# Patient Record
Sex: Male | Born: 1960
Health system: Southern US, Community
[De-identification: ages and names within clinical notes are randomized; demographics above are authoritative.]

## PROBLEM LIST (undated history)

## (undated) DIAGNOSIS — E785 Hyperlipidemia, unspecified: Secondary | ICD-10-CM

## (undated) DIAGNOSIS — F1110 Opioid abuse, uncomplicated: Secondary | ICD-10-CM

## (undated) DIAGNOSIS — I10 Essential (primary) hypertension: Secondary | ICD-10-CM

## (undated) DIAGNOSIS — E089 Diabetes mellitus due to underlying condition without complications: Secondary | ICD-10-CM

## (undated) DIAGNOSIS — K8689 Other specified diseases of pancreas: Secondary | ICD-10-CM

## (undated) DIAGNOSIS — N179 Acute kidney failure, unspecified: Secondary | ICD-10-CM

## (undated) DIAGNOSIS — K863 Pseudocyst of pancreas: Secondary | ICD-10-CM

## (undated) DIAGNOSIS — F1410 Cocaine abuse, uncomplicated: Secondary | ICD-10-CM

## (undated) HISTORY — DX: Other specified diseases of pancreas: K86.89

## (undated) HISTORY — DX: Hyperlipidemia, unspecified: E78.5

## (undated) HISTORY — DX: Pseudocyst of pancreas: K86.3

## (undated) HISTORY — DX: Diabetes mellitus due to underlying condition without complications: E08.9

---

## 1997-11-24 ENCOUNTER — Emergency Department (HOSPITAL_COMMUNITY): Admission: EM | Admit: 1997-11-24 | Discharge: 1997-11-24 | Payer: Self-pay | Admitting: Emergency Medicine

## 1998-10-02 ENCOUNTER — Observation Stay (HOSPITAL_COMMUNITY): Admission: EM | Admit: 1998-10-02 | Discharge: 1998-10-02 | Payer: Self-pay | Admitting: Emergency Medicine

## 1999-09-22 ENCOUNTER — Emergency Department (HOSPITAL_COMMUNITY): Admission: EM | Admit: 1999-09-22 | Discharge: 1999-09-22 | Payer: Self-pay | Admitting: Emergency Medicine

## 1999-09-22 ENCOUNTER — Encounter: Payer: Self-pay | Admitting: Emergency Medicine

## 2004-09-24 ENCOUNTER — Emergency Department (HOSPITAL_COMMUNITY): Admission: EM | Admit: 2004-09-24 | Discharge: 2004-09-24 | Payer: Self-pay | Admitting: Emergency Medicine

## 2008-02-29 ENCOUNTER — Ambulatory Visit: Payer: Self-pay | Admitting: Surgery

## 2008-02-29 ENCOUNTER — Emergency Department (HOSPITAL_COMMUNITY): Admission: EM | Admit: 2008-02-29 | Discharge: 2008-02-29 | Payer: Self-pay | Admitting: Emergency Medicine

## 2009-07-07 ENCOUNTER — Emergency Department (HOSPITAL_COMMUNITY): Admission: EM | Admit: 2009-07-07 | Discharge: 2009-07-07 | Payer: Self-pay | Admitting: Emergency Medicine

## 2009-12-02 ENCOUNTER — Emergency Department (HOSPITAL_COMMUNITY): Admission: EM | Admit: 2009-12-02 | Discharge: 2009-12-02 | Payer: Self-pay | Admitting: Emergency Medicine

## 2010-02-27 ENCOUNTER — Ambulatory Visit: Payer: Self-pay | Admitting: Internal Medicine

## 2010-02-27 ENCOUNTER — Encounter (INDEPENDENT_AMBULATORY_CARE_PROVIDER_SITE_OTHER): Payer: Self-pay | Admitting: Family Medicine

## 2010-02-27 LAB — CONVERTED CEMR LAB
ALT: 55 units/L — ABNORMAL HIGH (ref 0–53)
AST: 37 units/L (ref 0–37)
Albumin: 4.4 g/dL (ref 3.5–5.2)
Alkaline Phosphatase: 81 units/L (ref 39–117)
BUN: 16 mg/dL (ref 6–23)
Basophils Absolute: 0 10*3/uL (ref 0.0–0.1)
Basophils Relative: 1 % (ref 0–1)
CO2: 26 meq/L (ref 19–32)
Calcium: 9.7 mg/dL (ref 8.4–10.5)
Chloride: 106 meq/L (ref 96–112)
Creatinine, Ser: 1.28 mg/dL (ref 0.40–1.50)
Eosinophils Absolute: 0.2 10*3/uL (ref 0.0–0.7)
Eosinophils Relative: 3 % (ref 0–5)
Glucose, Bld: 94 mg/dL (ref 70–99)
HCT: 47.9 % (ref 39.0–52.0)
Hemoglobin: 16.5 g/dL (ref 13.0–17.0)
Lymphocytes Relative: 33 % (ref 12–46)
Lymphs Abs: 2.9 10*3/uL (ref 0.7–4.0)
MCHC: 34.4 g/dL (ref 30.0–36.0)
MCV: 96.8 fL (ref 78.0–100.0)
Monocytes Absolute: 0.7 10*3/uL (ref 0.1–1.0)
Monocytes Relative: 8 % (ref 3–12)
Neutro Abs: 4.9 10*3/uL (ref 1.7–7.7)
Neutrophils Relative %: 56 % (ref 43–77)
Platelets: 253 10*3/uL (ref 150–400)
Potassium: 4.1 meq/L (ref 3.5–5.3)
RBC: 4.95 M/uL (ref 4.22–5.81)
RDW: 13.2 % (ref 11.5–15.5)
Sodium: 143 meq/L (ref 135–145)
TSH: 1.229 microintl units/mL (ref 0.350–4.500)
Total Bilirubin: 1 mg/dL (ref 0.3–1.2)
Total Protein: 7.6 g/dL (ref 6.0–8.3)
WBC: 8.8 10*3/uL (ref 4.0–10.5)

## 2010-03-02 ENCOUNTER — Encounter (INDEPENDENT_AMBULATORY_CARE_PROVIDER_SITE_OTHER): Payer: Self-pay | Admitting: Family Medicine

## 2010-03-02 LAB — CONVERTED CEMR LAB
HCV Ab: NEGATIVE
Hep A Total Ab: NEGATIVE
Hep B Core Total Ab: NEGATIVE
Hep B S Ab: NEGATIVE

## 2010-03-26 ENCOUNTER — Ambulatory Visit: Payer: Self-pay | Admitting: Internal Medicine

## 2010-03-26 ENCOUNTER — Encounter (INDEPENDENT_AMBULATORY_CARE_PROVIDER_SITE_OTHER): Payer: Self-pay | Admitting: Family Medicine

## 2010-03-26 LAB — CONVERTED CEMR LAB
ALT: 42 units/L (ref 0–53)
HDL: 35 mg/dL — ABNORMAL LOW (ref >39)
LDL Cholesterol: 150 mg/dL — ABNORMAL HIGH (ref 0–99)
Total CHOL/HDL Ratio: 6.3

## 2010-04-02 ENCOUNTER — Ambulatory Visit: Payer: Self-pay | Admitting: Internal Medicine

## 2010-04-02 ENCOUNTER — Encounter (INDEPENDENT_AMBULATORY_CARE_PROVIDER_SITE_OTHER): Payer: Self-pay | Admitting: Family Medicine

## 2010-04-02 LAB — CONVERTED CEMR LAB: Microalb, Ur: 0.51 mg/dL (ref 0.00–1.89)

## 2010-04-16 ENCOUNTER — Encounter (INDEPENDENT_AMBULATORY_CARE_PROVIDER_SITE_OTHER): Payer: Self-pay | Admitting: Family Medicine

## 2010-04-16 LAB — CONVERTED CEMR LAB
BUN: 15 mg/dL (ref 6–23)
Calcium: 9.4 mg/dL (ref 8.4–10.5)
Chloride: 103 meq/L (ref 96–112)
Creatinine, Ser: 1.15 mg/dL (ref 0.40–1.50)

## 2010-05-14 ENCOUNTER — Ambulatory Visit: Payer: Self-pay | Admitting: Internal Medicine

## 2010-10-20 LAB — URINALYSIS, ROUTINE W REFLEX MICROSCOPIC
Nitrite: NEGATIVE
Protein, ur: NEGATIVE mg/dL
Specific Gravity, Urine: 1.024 (ref 1.005–1.030)
Urobilinogen, UA: 1 mg/dL (ref 0.0–1.0)

## 2010-10-20 LAB — POCT I-STAT, CHEM 8
Chloride: 109 mEq/L (ref 96–112)
Creatinine, Ser: 0.9 mg/dL (ref 0.4–1.5)
HCT: 48 % (ref 39.0–52.0)
Hemoglobin: 16.3 g/dL (ref 13.0–17.0)
Potassium: 3.8 mEq/L (ref 3.5–5.1)
Sodium: 141 mEq/L (ref 135–145)

## 2010-10-20 LAB — CBC
MCV: 98.9 fL (ref 78.0–100.0)
Platelets: 225 10*3/uL (ref 150–400)
RBC: 4.62 MIL/uL (ref 4.22–5.81)
WBC: 8.6 10*3/uL (ref 4.0–10.5)

## 2010-10-20 LAB — DIFFERENTIAL
Eosinophils Absolute: 0.2 10*3/uL (ref 0.0–0.7)
Lymphocytes Relative: 41 % (ref 12–46)
Lymphs Abs: 3.5 10*3/uL (ref 0.7–4.0)
Monocytes Relative: 9 % (ref 3–12)
Neutro Abs: 4 10*3/uL (ref 1.7–7.7)
Neutrophils Relative %: 47 % (ref 43–77)

## 2010-11-03 LAB — DIFFERENTIAL
Basophils Relative: 0 % (ref 0–1)
Eosinophils Absolute: 0.3 10*3/uL (ref 0.0–0.7)
Monocytes Absolute: 0.5 10*3/uL (ref 0.1–1.0)
Monocytes Relative: 6 % (ref 3–12)
Neutro Abs: 4.9 10*3/uL (ref 1.7–7.7)

## 2010-11-03 LAB — COMPREHENSIVE METABOLIC PANEL
ALT: 49 U/L (ref 0–53)
AST: 39 U/L — ABNORMAL HIGH (ref 0–37)
Albumin: 3.7 g/dL (ref 3.5–5.2)
Alkaline Phosphatase: 84 U/L (ref 39–117)
GFR calc Af Amer: >60 mL/min (ref >60)
Potassium: 3.7 mEq/L (ref 3.5–5.1)
Sodium: 144 mEq/L (ref 135–145)
Total Protein: 7 g/dL (ref 6.0–8.3)

## 2010-11-03 LAB — POCT CARDIAC MARKERS
Myoglobin, poc: 153 ng/mL (ref 12–200)
Troponin i, poc: <0.05 ng/mL (ref 0.00–0.09)

## 2010-11-03 LAB — URINALYSIS, ROUTINE W REFLEX MICROSCOPIC
Glucose, UA: NEGATIVE mg/dL
Protein, ur: NEGATIVE mg/dL
Urobilinogen, UA: 1 mg/dL (ref 0.0–1.0)

## 2010-11-03 LAB — CBC
Platelets: 216 10*3/uL (ref 150–400)
RDW: 13.9 % (ref 11.5–15.5)

## 2013-05-16 ENCOUNTER — Emergency Department (HOSPITAL_BASED_OUTPATIENT_CLINIC_OR_DEPARTMENT_OTHER)
Admission: EM | Admit: 2013-05-16 | Discharge: 2013-05-16 | Disposition: A | Discharge status: Home / Self Care | Payer: Self-pay | Attending: Emergency Medicine | Admitting: Emergency Medicine

## 2013-05-16 ENCOUNTER — Emergency Department (HOSPITAL_BASED_OUTPATIENT_CLINIC_OR_DEPARTMENT_OTHER): Payer: Self-pay

## 2013-05-16 ENCOUNTER — Encounter (HOSPITAL_BASED_OUTPATIENT_CLINIC_OR_DEPARTMENT_OTHER): Payer: Self-pay | Admitting: Emergency Medicine

## 2013-05-16 DIAGNOSIS — M25562 Pain in left knee: Secondary | ICD-10-CM

## 2013-05-16 DIAGNOSIS — R52 Pain, unspecified: Secondary | ICD-10-CM | POA: Insufficient documentation

## 2013-05-16 DIAGNOSIS — I10 Essential (primary) hypertension: Secondary | ICD-10-CM | POA: Insufficient documentation

## 2013-05-16 DIAGNOSIS — M25569 Pain in unspecified knee: Secondary | ICD-10-CM | POA: Insufficient documentation

## 2013-05-16 DIAGNOSIS — F17200 Nicotine dependence, unspecified, uncomplicated: Secondary | ICD-10-CM | POA: Insufficient documentation

## 2013-05-16 HISTORY — DX: Essential (primary) hypertension: I10

## 2013-05-16 MED ORDER — MELOXICAM 15 MG PO TABS: 15.0000 mg | ORAL_TABLET | Freq: Every day | ORAL | Status: DC

## 2013-05-16 NOTE — ED Provider Notes (Signed)
CSN: [id number]     Arrival date & time 05/16/13  1933 History  This chart was scribed for Charles B. Bernette Mayers, MD by Karle Plumber, ED Scribe. This patient was seen in room MH09/MH09 and the patient's care was started at 7:56 PM.   Chief Complaint  Patient presents with  . Leg Pain   The history is provided by the patient. No language interpreter was used.   HPI Comments:  Ricky Boyle is a 52 y.o. male who presents to the Emergency Department complaining of a left swollen knee with associated moderate pain onset one week. He reports associated tingling of the left foot and some diaphoresis. He reports taking one day off of work in which he had relief of the swelling and pain. He states he went back to work last night where he stands all night and the pain and swelling started again. He denies any recent injury or travel.   Past Medical History  Diagnosis Date  . Hypertension    History reviewed. No pertinent past surgical history. History reviewed. No pertinent family history. History  Substance Use Topics  . Smoking status: Current Every Day Smoker  . Smokeless tobacco: Not on file  . Alcohol Use: No    Review of Systems A complete 10 system review of systems was obtained and all systems are negative except as noted in the HPI and PMH.   Allergies  Review of patient's allergies indicates no known allergies.  Home Medications  No current outpatient prescriptions on file. Triage Vitals: BP 169/103  Pulse 95  Temp(Src) 98.5 F (36.9 C) (Oral)  Resp 20  Ht 5\' 6"  (1.676 m)  Wt 247 lb (112.038 kg)  BMI 39.89 kg/m2  SpO2 98% Physical Exam  Constitutional: He is oriented to person, place, and time. He appears well-developed and well-nourished.  HENT:  Head: Normocephalic and atraumatic.  Neck: Neck supple.  Pulmonary/Chest: Effort normal.  Musculoskeletal: He exhibits edema and tenderness.  Mild effusion of the left knee. Tender to palpation to suprapatellar area.  Mild crepitus. No joint instability. No erythema. No warmth.  Neurological: He is alert and oriented to person, place, and time. No cranial nerve deficit.  Psychiatric: He has a normal mood and affect. His behavior is normal.    ED Course  Procedures (including critical care time) DIAGNOSTIC STUDIES: Oxygen Saturation is 98% on RA, normal by my interpretation.   COORDINATION OF CARE: 7:59 PM- Will obtain an X-Ray of the left knee. Pt verbalizes understanding and agrees to plan.  Medications - No data to display  Labs Review Labs Reviewed - No data to display Imaging Review Dg Knee 2 Views Left  05/16/2013   CLINICAL DATA:  Pain.  Swelling.  EXAM: LEFT KNEE - 1-2 VIEW  COMPARISON:  None.  FINDINGS: There is no evidence of fracture, dislocation, or significant joint space narrowing. Small dorsal patellar spur. Small effusion. No abnormal calcifications.  IMPRESSION: Small effusion. No acute osseous abnormality.   Electronically Signed   By: Davonna Belling M.D.   On: 05/16/2013 20:44    EKG Interpretation   None       MDM   1. Knee pain, acute, left     Imaging results reviewed. Knee immobilizer for comfort. Meloxicam for pain, Followup with Dr. Pearletha Forge for recheck.   I personally performed the services described in this documentation, which was scribed in my presence. The recorded information has been reviewed and is accurate.      Leonette Most  Raiford Simmonds, MD 05/16/13 2105

## 2013-05-16 NOTE — ED Notes (Signed)
Pt c/o L knee to L foot pain x 8 days that decreases when putting standing pressure on foot. Pt sts last night his knee cap was swollen and he couldn't lay his leg on the bed due to pain.  Pt used bengay and an ace bandage around the knee with no relief. Pt denies injury or fall.

## 2013-05-16 NOTE — ED Notes (Signed)
Pt has hx of HTN but is taking no meds at this time d/t money and no PMD

## 2013-05-16 NOTE — ED Notes (Signed)
C/o left lower leg pain since last night, denies injury. Pt states that leg seems more swollen than other.

## 2013-09-22 ENCOUNTER — Emergency Department (HOSPITAL_BASED_OUTPATIENT_CLINIC_OR_DEPARTMENT_OTHER)
Admission: EM | Admit: 2013-09-22 | Discharge: 2013-09-22 | Disposition: A | Discharge status: Home / Self Care | Payer: No Typology Code available for payment source | Attending: Emergency Medicine | Admitting: Emergency Medicine

## 2013-09-22 ENCOUNTER — Encounter (HOSPITAL_BASED_OUTPATIENT_CLINIC_OR_DEPARTMENT_OTHER): Payer: Self-pay | Admitting: Emergency Medicine

## 2013-09-22 DIAGNOSIS — I10 Essential (primary) hypertension: Secondary | ICD-10-CM | POA: Insufficient documentation

## 2013-09-22 DIAGNOSIS — Z79899 Other long term (current) drug therapy: Secondary | ICD-10-CM | POA: Insufficient documentation

## 2013-09-22 DIAGNOSIS — F17200 Nicotine dependence, unspecified, uncomplicated: Secondary | ICD-10-CM | POA: Insufficient documentation

## 2013-09-22 MED ORDER — HYDROCHLOROTHIAZIDE 25 MG PO TABS
25.0000 mg | ORAL_TABLET | Freq: Once | ORAL | Status: AC
Start: 2013-09-22 — End: 2013-09-22
  Administered 2013-09-22: 25 mg via ORAL
  Filled 2013-09-22: qty 1

## 2013-09-22 MED ORDER — HYDROCHLOROTHIAZIDE 25 MG PO TABS: 25.0000 mg | ORAL_TABLET | Freq: Every day | ORAL | Status: DC

## 2013-09-22 NOTE — ED Notes (Signed)
Pt reports waking up about 1.5hrs prior to arrival with a headache. States that he has been diagnosed with HTN but has not taken any meds for it due to financial reasons. States that he just got insurance and wants BP meds to control his blood pressure. Pt also requesting a referral list for PCP. Pt rates headache 10/10. States he knows its from his high BP. States he took mustard b/c iits an old family cure for HTN.

## 2013-09-22 NOTE — Discharge Instructions (Signed)
DASH Diet  The DASH diet stands for "Dietary Approaches to Stop Hypertension." It is a healthy eating plan that has been shown to reduce high blood pressure (hypertension) in as little as 14 days, while also possibly providing other significant health benefits. These other health benefits include reducing the risk of breast cancer after menopause and reducing the risk of type 2 diabetes, heart disease, colon cancer, and stroke. Health benefits also include weight loss and slowing kidney failure in patients with chronic kidney disease.   DIET GUIDELINES  · Limit salt (sodium). Your diet should contain less than 1500 mg of sodium daily.  · Limit refined or processed carbohydrates. Your diet should include mostly whole grains. Desserts and added sugars should be used sparingly.  · Include small amounts of heart-healthy fats. These types of fats include nuts, oils, and tub margarine. Limit saturated and trans fats. These fats have been shown to be harmful in the body.  CHOOSING FOODS   The following food groups are based on a 2000 calorie diet. See your Registered Dietitian for individual calorie needs.  Grains and Grain Products (6 to 8 servings daily)  · Eat More Often: Whole-wheat bread, brown rice, whole-grain or wheat pasta, quinoa, popcorn without added fat or salt (air popped).  · Eat Less Often: White bread, white pasta, white rice, cornbread.  Vegetables (4 to 5 servings daily)  · Eat More Often: Fresh, frozen, and canned vegetables. Vegetables may be raw, steamed, roasted, or grilled with a minimal amount of fat.  · Eat Less Often/Avoid: Creamed or fried vegetables. Vegetables in a cheese sauce.  Fruit (4 to 5 servings daily)  · Eat More Often: All fresh, canned (in natural juice), or frozen fruits. Dried fruits without added sugar. One hundred percent fruit juice (½ cup [237 mL] daily).  · Eat Less Often: Dried fruits with added sugar. Canned fruit in light or heavy syrup.  Lean Meats, Fish, and Poultry (2  servings or less daily. One serving is 3 to 4 oz [85-114 g]).  · Eat More Often: Ninety percent or leaner ground beef, tenderloin, sirloin. Round cuts of beef, chicken breast, turkey breast. All fish. Grill, bake, or broil your meat. Nothing should be fried.  · Eat Less Often/Avoid: Fatty cuts of meat, turkey, or chicken leg, thigh, or wing. Fried cuts of meat or fish.  Dairy (2 to 3 servings)  · Eat More Often: Low-fat or fat-free milk, low-fat plain or light yogurt, reduced-fat or part-skim cheese.  · Eat Less Often/Avoid: Milk (whole, 2%). Whole milk yogurt. Full-fat cheeses.  Nuts, Seeds, and Legumes (4 to 5 servings per week)  · Eat More Often: All without added salt.  · Eat Less Often/Avoid: Salted nuts and seeds, canned beans with added salt.  Fats and Sweets (limited)  · Eat More Often: Vegetable oils, tub margarines without trans fats, sugar-free gelatin. Mayonnaise and salad dressings.  · Eat Less Often/Avoid: Coconut oils, palm oils, butter, stick margarine, cream, half and half, cookies, candy, pie.  FOR MORE INFORMATION  The Dash Diet Eating Plan: www.dashdiet.org  Document Released: 07/08/2011 Document Revised: 10/11/2011 Document Reviewed: 07/08/2011  [employer]® Patient Information ©2014 [employer].

## 2013-09-22 NOTE — ED Provider Notes (Signed)
CSN: [id number]     Arrival date & time 09/22/13  0121 History   First MD Initiated Contact with Patient 09/22/13 0136     Chief Complaint  Patient presents with  . Hypertension     (Consider location/radiation/quality/duration/timing/severity/associated sxs/prior Treatment) HPI Patient reports waking up with a headache and states he woke him up from sleep.  He states his headache is resolved now he has arrived at the emergency department.  He tried mustard at home which he reports is an old family remedy.  He states he has had high blood pressure for some time and used to be on medications but is no longer.  He states he recently received insurance is trying to get a primary care physician.  He believes he is to be restarted on medications for his blood pressure.  No changes in vision.  No nausea or vomiting.  No weakness of his arms or legs.  No chest pain shortness of breath.  No abdominal pain.  Past Medical History  Diagnosis Date  . Hypertension    History reviewed. No pertinent past surgical history. No family history on file. History  Substance Use Topics  . Smoking status: Current Every Day Smoker -- 0.50 packs/day for 25 years    Types: Cigarettes  . Smokeless tobacco: Never Used  . Alcohol Use: No    Review of Systems  All other systems reviewed and are negative.      Allergies  Bee venom and Shrimp  Home Medications   Current Outpatient Rx  Name  Route  Sig  Dispense  Refill  . hydrochlorothiazide (HYDRODIURIL) 25 MG tablet   Oral   Take 1 tablet (25 mg total) by mouth daily.   30 tablet   0   . meloxicam (MOBIC) 15 MG tablet   Oral   Take 1 tablet (15 mg total) by mouth daily.   15 tablet   0    BP 178/107  Pulse 86  Temp(Src) 98.4 F (36.9 C) (Oral)  Resp 18  Ht 5\' 6"  (1.676 m)  Wt 247 lb (112.038 kg)  BMI 39.89 kg/m2  SpO2 97% Physical Exam  Nursing note and vitals reviewed. Constitutional: He is oriented to person, place, and time. He  appears well-developed and well-nourished.  HENT:  Head: Normocephalic and atraumatic.  Eyes: EOM are normal.  Neck: Normal range of motion.  Cardiovascular: Normal rate, regular rhythm, normal heart sounds and intact distal pulses.   Pulmonary/Chest: Effort normal and breath sounds normal. No respiratory distress.  Abdominal: Soft.  Musculoskeletal: Normal range of motion.  Neurological: He is alert and oriented to person, place, and time.  Skin: Skin is warm and dry.  Psychiatric: He has a normal mood and affect. Judgment normal.    ED Course  Procedures (including critical care time) Labs Review Labs Reviewed - No data to display Imaging Review No results found.  EKG Interpretation   None         MDM   Final diagnoses:  Hypertension    Patient we restarted on blood pressure medications,  I will start him on hydrochlorothiazide.  He's been given a list of primary care physicians.  He understands to return to ER for new or worsening symptoms.  During his headache his headache is resolved.  Doubt subarachnoid hemorrhage.  Doubt intracranial hemorrhage    Lyanne CoKevin M Henley Boettner, MD 09/22/13 [phone]

## 2013-09-22 NOTE — ED Notes (Signed)
C/o ha and high bp  Dx w htn but has not been taken meds

## 2014-05-11 ENCOUNTER — Emergency Department (HOSPITAL_BASED_OUTPATIENT_CLINIC_OR_DEPARTMENT_OTHER)
Admission: EM | Admit: 2014-05-11 | Discharge: 2014-05-11 | Disposition: A | Discharge status: Home / Self Care | Payer: No Typology Code available for payment source | Attending: Emergency Medicine | Admitting: Emergency Medicine

## 2014-05-11 ENCOUNTER — Encounter (HOSPITAL_BASED_OUTPATIENT_CLINIC_OR_DEPARTMENT_OTHER): Payer: Self-pay | Admitting: Emergency Medicine

## 2014-05-11 DIAGNOSIS — Z720 Tobacco use: Secondary | ICD-10-CM | POA: Diagnosis not present

## 2014-05-11 DIAGNOSIS — Z79899 Other long term (current) drug therapy: Secondary | ICD-10-CM | POA: Insufficient documentation

## 2014-05-11 DIAGNOSIS — I10 Essential (primary) hypertension: Secondary | ICD-10-CM | POA: Insufficient documentation

## 2014-05-11 DIAGNOSIS — G44039 Episodic paroxysmal hemicrania, not intractable: Secondary | ICD-10-CM | POA: Insufficient documentation

## 2014-05-11 DIAGNOSIS — Z791 Long term (current) use of non-steroidal anti-inflammatories (NSAID): Secondary | ICD-10-CM | POA: Diagnosis not present

## 2014-05-11 DIAGNOSIS — I160 Hypertensive urgency: Secondary | ICD-10-CM

## 2014-05-11 DIAGNOSIS — R51 Headache: Secondary | ICD-10-CM | POA: Diagnosis present

## 2014-05-11 LAB — CBC WITH DIFFERENTIAL/PLATELET
Basophils Absolute: 0.1 10*3/uL (ref 0.0–0.1)
Basophils Relative: 1 % (ref 0–1)
Eosinophils Absolute: 0.3 10*3/uL (ref 0.0–0.7)
Eosinophils Relative: 3 % (ref 0–5)
HCT: 43.3 % (ref 39.0–52.0)
HEMOGLOBIN: 14.8 g/dL (ref 13.0–17.0)
LYMPHS ABS: 3.1 10*3/uL (ref 0.7–4.0)
LYMPHS PCT: 33 % (ref 12–46)
MCH: 32 pg (ref 26.0–34.0)
MCHC: 34.2 g/dL (ref 30.0–36.0)
MCV: 93.5 fL (ref 78.0–100.0)
MONOS PCT: 9 % (ref 3–12)
Monocytes Absolute: 0.8 10*3/uL (ref 0.1–1.0)
NEUTROS ABS: 5 10*3/uL (ref 1.7–7.7)
NEUTROS PCT: 54 % (ref 43–77)
PLATELETS: 260 10*3/uL (ref 150–400)
RBC: 4.63 MIL/uL (ref 4.22–5.81)
RDW: 13.1 % (ref 11.5–15.5)
WBC: 9.3 10*3/uL (ref 4.0–10.5)

## 2014-05-11 LAB — COMPREHENSIVE METABOLIC PANEL
ALK PHOS: 95 U/L (ref 39–117)
ALT: 36 U/L (ref 0–53)
ANION GAP: 13 (ref 5–15)
AST: 30 U/L (ref 0–37)
Albumin: 3.7 g/dL (ref 3.5–5.2)
BILIRUBIN TOTAL: 0.6 mg/dL (ref 0.3–1.2)
BUN: 17 mg/dL (ref 6–23)
CHLORIDE: 104 meq/L (ref 96–112)
CO2: 25 meq/L (ref 19–32)
Calcium: 9.4 mg/dL (ref 8.4–10.5)
Creatinine, Ser: 1.3 mg/dL (ref 0.50–1.35)
GFR, EST AFRICAN AMERICAN: 71 mL/min — AB (ref >90)
GFR, EST NON AFRICAN AMERICAN: 62 mL/min — AB (ref >90)
GLUCOSE: 122 mg/dL — AB (ref 70–99)
POTASSIUM: 3.6 meq/L — AB (ref 3.7–5.3)
SODIUM: 142 meq/L (ref 137–147)
TOTAL PROTEIN: 8.2 g/dL (ref 6.0–8.3)

## 2014-05-11 MED ORDER — AMLODIPINE BESYLATE 5 MG PO TABS: 5.0000 mg | ORAL_TABLET | Freq: Every day | ORAL | Status: DC

## 2014-05-11 MED ORDER — HYDROCHLOROTHIAZIDE 12.5 MG PO TABS: 12.5000 mg | ORAL_TABLET | Freq: Every day | ORAL | Status: DC

## 2014-05-11 NOTE — ED Provider Notes (Signed)
CSN: [id number]     Arrival date & time 05/11/14  0139 History   First MD Initiated Contact with Patient 05/11/14 [id number]     Chief Complaint  Patient presents with  . Headache     (Consider location/radiation/quality/duration/timing/severity/associated sxs/prior Treatment) HPI Comments: Pt comes in with cc of headaches. Pt has hx of HTN, new diagnosis, and was started on Lis 10 mg by ED from outside hospital. Pt reports going to the ER with cc of knee pain, noted to have BP in the 190s and he had a moderate headache, so a CT head was done which was normal. Pt states that he was prescribed lisinopril 10 mg, which he has been taking qhs. Pt has been having headaches daily, few min after lisinopril intake. Typically he takes ibuprofen, and the pain responds, but today, the pain was more severe and didn't respond to ibuprofen, so he came to the ER. Pt is currently headache free. His BP was 228/115 - though now it is 190/90. Pt has been taking Lis as prescribed. Pt denies nausea, emesis, fevers, chills, chest pains, shortness of breath, headaches, abdominal pain, uti like symptoms. No visual complains with the headache, or any numbness, tingling ,weakness. Headache is described as    Patient is a 53 y.o. male presenting with headaches. The history is provided by the patient.  Headache Associated symptoms: no abdominal pain, no cough, no dizziness, no pain and no numbness     Past Medical History  Diagnosis Date  . Hypertension    History reviewed. No pertinent past surgical history. No family history on file. History  Substance Use Topics  . Smoking status: Current Every Day Smoker -- 0.50 packs/day for 25 years    Types: Cigarettes  . Smokeless tobacco: Never Used  . Alcohol Use: No    Review of Systems  Constitutional: Negative for activity change and appetite change.  Eyes: Negative for pain and visual disturbance.  Respiratory: Negative for cough and shortness of breath.    Cardiovascular: Negative for chest pain.  Gastrointestinal: Negative for abdominal pain.  Genitourinary: Negative for dysuria.  Neurological: Positive for headaches. Negative for dizziness, facial asymmetry, speech difficulty, weakness, light-headedness and numbness.      Allergies  Bee venom and Shrimp  Home Medications   Prior to Admission medications   Medication Sig Start Date End Date Taking? Authorizing Provider  lisinopril (PRINIVIL,ZESTRIL) 10 MG tablet Take 10 mg by mouth daily.   Yes Historical Provider, MD  amLODipine (NORVASC) 5 MG tablet Take 1 tablet (5 mg total) by mouth daily. 05/11/14   Derwood KaplanAnkit Shekia Kuper, MD  hydrochlorothiazide (HYDRODIURIL) 25 MG tablet Take 1 tablet (25 mg total) by mouth daily. 09/22/13   Lyanne CoKevin M Campos, MD  meloxicam (MOBIC) 15 MG tablet Take 1 tablet (15 mg total) by mouth daily. 05/16/13   Charles B. Bernette MayersSheldon, MD   BP 181/97  Pulse 68  Temp(Src) 98.8 F (37.1 C) (Oral)  Resp 18  Ht 5\' 7"  (1.702 m)  Wt 268 lb (121.564 kg)  BMI 41.96 kg/m2  SpO2 98% Physical Exam  Constitutional: He is oriented to person, place, and time. He appears well-developed.  HENT:  Head: Normocephalic and atraumatic.  Eyes: Conjunctivae and EOM are normal. Pupils are equal, round, and reactive to light.  Neck: Normal range of motion. Neck supple.  Cardiovascular: Normal rate and regular rhythm.   Pulmonary/Chest: Effort normal and breath sounds normal.  Abdominal: Soft. Bowel sounds are normal. He exhibits no distension. There  is no tenderness. There is no rebound and no guarding.  Neurological: He is alert and oriented to person, place, and time. No cranial nerve deficit. Coordination normal.  Skin: Skin is warm.    ED Course  Procedures (including critical care time) Labs Review Labs Reviewed  COMPREHENSIVE METABOLIC PANEL - Abnormal; Notable for the following:    Potassium 3.6 (*)    Glucose, Bld 122 (*)    GFR calc non Af Amer 62 (*)    GFR calc Af Amer  71 (*)    All other components within normal limits  CBC WITH DIFFERENTIAL    Imaging Review No results found.   EKG Interpretation None      MDM   Final diagnoses:  Episodic paroxysmal hemicrania, not intractable  Hypertensive urgency    Pt comes in with cc of headaches. Pt is headache free when i saw him. He has elevated BP, and was started on Lisinopril recently, and states that headache started after Lis intake, which is a known side effect. Pt has no neuro complains associated with the headache, and has no headache or neuro deficit. Pt also has no visual complains associated with the headache. Likely med side effect, unlikely brain bleed or emergent cause for the headache - as it is resolved. No further ER workup indicated.  BP was MAP 155 at arrival, but MAP 122 at the time of discharge. Pt has no complains during my eval and the screening labs per triage are normal. HTN urgency - switch to amlodipine.   Derwood KaplanAnkit Keiran Gaffey, MD 05/11/14 [phone]

## 2014-05-11 NOTE — Discharge Instructions (Signed)
We saw you in the ER for the headache - we suspect is from the Lisinopril We are switching you to amlodipine. ER DOCTORS ARE NOT WELL TRAINED AT MANAGING BLOOD PRESSURE - so please see the primary on Monday for optimal BP management.  Please return to the ER if your symptoms worsen; you have increased pain, fevers, chills, inability to keep any medications down, confusion. Otherwise see the outpatient doctor as requested.   How to Take Your Blood Pressure HOW DO I GET A BLOOD PRESSURE MACHINE?  You can buy an electronic home blood pressure machine at your local pharmacy. Insurance will sometimes cover the cost if you have a prescription.  Ask your doctor what type of machine is best for you. There are different machines for your arm and your wrist.  If you decide to buy a machine to check your blood pressure on your arm, first check the size of your arm so you can buy the right size cuff. To check the size of your arm:   Use a measuring tape that shows both inches and centimeters.   Wrap the measuring tape around the upper-middle part of your arm. You may need someone to help you measure.   Write down your arm measurement in both inches and centimeters.   To measure your blood pressure correctly, it is important to have the right size cuff.   If your arm is up to 13 inches (up to 34 centimeters), get an adult cuff size.  If your arm is 13 to 17 inches (35 to 44 centimeters), get a large adult cuff size.    If your arm is 17 to 20 inches (45 to 52 centimeters), get an adult thigh cuff.  WHAT DO THE NUMBERS MEAN?   There are two numbers that make up your blood pressure. For example: 120/80.  The first number (120 in our example) is called the "systolic pressure." It is a measure of the pressure in your blood vessels when your heart is pumping blood.  The second number (80 in our example) is called the "diastolic pressure." It is a measure of the pressure in your blood vessels  when your heart is resting between beats.  Your doctor will tell you what your blood pressure should be. WHAT SHOULD I DO BEFORE I CHECK MY BLOOD PRESSURE?   Try to rest or relax for at least 30 minutes before you check your blood pressure.  Do not smoke.  Do not have any drinks with caffeine, such as:  Soda.  Coffee.  Tea.  Check your blood pressure in a quiet room.  Sit down and stretch out your arm on a table. Keep your arm at about the level of your heart. Let your arm relax.  Make sure that your legs are not crossed. HOW DO I CHECK MY BLOOD PRESSURE?  Follow the directions that came with your machine.  Make sure you remove any tight-fighting clothing from your arm or wrist. Wrap the cuff around your upper arm or wrist. You should be able to fit a finger between the cuff and your arm. If you cannot fit a finger between the cuff and your arm, it is too tight and should be removed and rewrapped.  Some units require you to manually pump up the arm cuff.  Automatic units inflate the cuff when you press a button.  Cuff deflation is automatic in both models.  After the cuff is inflated, the unit measures your blood pressure and pulse.  The readings are shown on a monitor. Hold still and breathe normally while the cuff is inflated.  Getting a reading takes less than a minute.  Some models store readings in a memory. Some provide a printout of readings. If your machine does not store your readings, keep a written record.  Take readings with you to your next visit with your doctor. Document Released: 07/01/2008 Document Revised: 12/03/2013 Document Reviewed: 09/13/2013 [facility] Patient Information 2015 [place], [place]. This information is not intended to replace advice given to you by your health care provider. Make sure you discuss any questions you have with your health care provider.  Managing Your High Blood Pressure Blood pressure is a measurement of how forceful your  blood is pressing against the walls of the arteries. Arteries are muscular tubes within the circulatory system. Blood pressure does not stay the same. Blood pressure rises when you are active, excited, or nervous; and it lowers during sleep and relaxation. If the numbers measuring your blood pressure stay above normal most of the time, you are at risk for health problems. High blood pressure (hypertension) is a long-term (chronic) condition in which blood pressure is elevated. A blood pressure reading is recorded as two numbers, such as 120 over 80 (or 120/80). The first, higher number is called the systolic pressure. It is a measure of the pressure in your arteries as the heart beats. The second, lower number is called the diastolic pressure. It is a measure of the pressure in your arteries as the heart relaxes between beats.  Keeping your blood pressure in a normal range is important to your overall health and prevention of health problems, such as heart disease and stroke. When your blood pressure is uncontrolled, your heart has to work harder than normal. High blood pressure is a very common condition in adults because blood pressure tends to rise with age. Men and women are equally likely to have hypertension but at different times in life. Before age 33, men are more likely to have hypertension. After 52 years of age, women are more likely to have it. Hypertension is especially common in African Americans. This condition often has no signs or symptoms. The cause of the condition is usually not known. Your caregiver can help you come up with a plan to keep your blood pressure in a normal, healthy range. BLOOD PRESSURE STAGES Blood pressure is classified into four stages: normal, prehypertension, stage 1, and stage 2. Your blood pressure reading will be used to determine what type of treatment, if any, is necessary. Appropriate treatment options are tied to these four stages:  Normal  Systolic pressure  (mm Hg): below 120.  Diastolic pressure (mm Hg): below 80. Prehypertension  Systolic pressure (mm Hg): 120 to 139.  Diastolic pressure (mm Hg): 80 to 89. Stage1  Systolic pressure (mm Hg): 140 to 159.  Diastolic pressure (mm Hg): 90 to 99. Stage2  Systolic pressure (mm Hg): 160 or above.  Diastolic pressure (mm Hg): 100 or above. RISKS RELATED TO HIGH BLOOD PRESSURE Managing your blood pressure is an important responsibility. Uncontrolled high blood pressure can lead to:  A heart attack.  A stroke.  A weakened blood vessel (aneurysm).  Heart failure.  Kidney damage.  Eye damage.  Metabolic syndrome.  Memory and concentration problems. HOW TO MANAGE YOUR BLOOD PRESSURE Blood pressure can be managed effectively with lifestyle changes and medicines (if needed). Your caregiver will help you come up with a plan to bring your blood pressure  within a normal range. Your plan should include the following: Education  Read all information provided by your caregivers about how to control blood pressure.  Educate yourself on the latest guidelines and treatment recommendations. New research is always being done to further define the risks and treatments for high blood pressure. Lifestylechanges  Control your weight.  Avoid smoking.  Stay physically active.  Reduce the amount of salt in your diet.  Reduce stress.  Control any chronic conditions, such as high cholesterol or diabetes.  Reduce your alcohol intake. Medicines  Several medicines (antihypertensive medicines) are available, if needed, to bring blood pressure within a normal range. Communication  Review all the medicines you take with your caregiver because there may be side effects or interactions.  Talk with your caregiver about your diet, exercise habits, and other lifestyle factors that may be contributing to high blood pressure.  See your caregiver regularly. Your caregiver can help you create and  adjust your plan for managing high blood pressure. RECOMMENDATIONS FOR TREATMENT AND FOLLOW-UP  The following recommendations are based on current guidelines for managing high blood pressure in nonpregnant adults. Use these recommendations to identify the proper follow-up period or treatment option based on your blood pressure reading. You can discuss these options with your caregiver.  Systolic pressure of 120 to 139 or diastolic pressure of 80 to 89: Follow up with your caregiver as directed.  Systolic pressure of 140 to 160 or diastolic pressure of 90 to 100: Follow up with your caregiver within 2 months.  Systolic pressure above 160 or diastolic pressure above 100: Follow up with your caregiver within 1 month.  Systolic pressure above 180 or diastolic pressure above 110: Consider antihypertensive therapy; follow up with your caregiver within 1 week.  Systolic pressure above 200 or diastolic pressure above 120: Begin antihypertensive therapy; follow up with your caregiver within 1 week. Document Released: 04/12/2012 Document Reviewed: 04/12/2012 [facility] Patient Information 2015 [place]. This information is not intended to replace advice given to you by your health care provider. Make sure you discuss any questions you have with your health care provider.  Amlodipine tablets What is this medicine? AMLODIPINE (am LOE di peen) is a calcium-channel blocker. It affects the amount of calcium found in your heart and muscle cells. This relaxes your blood vessels, which can reduce the amount of work the heart has to do. This medicine is used to lower high blood pressure. It is also used to prevent chest pain. This medicine may be used for other purposes; ask your health care provider or pharmacist if you have questions. COMMON BRAND NAME(S): Norvasc What should I tell my health care provider before I take this medicine? They need to know if you have any of these conditions: -heart problems  like heart failure or aortic stenosis -liver disease -an unusual or allergic reaction to amlodipine, other medicines, foods, dyes, or preservatives -pregnant or trying to get pregnant -breast-feeding How should I use this medicine? Take this medicine by mouth with a glass of water. Follow the directions on the prescription label. Take your medicine at regular intervals. Do not take more medicine than directed. Talk to your pediatrician regarding the use of this medicine in children. Special care may be needed. This medicine has been used in children as young as 6. Persons over 871 years old may have a stronger reaction to this medicine and need smaller doses. Overdosage: If you think you have taken too much of this medicine contact a  poison control center or emergency room at once. NOTE: This medicine is only for you. Do not share this medicine with others. What if I miss a dose? If you miss a dose, take it as soon as you can. If it is almost time for your next dose, take only that dose. Do not take double or extra doses. What may interact with this medicine? -herbal or dietary supplements -local or general anesthetics -medicines for high blood pressure -medicines for prostate problems -rifampin This list may not describe all possible interactions. Give your health care provider a list of all the medicines, herbs, non-prescription drugs, or dietary supplements you use. Also tell them if you smoke, drink alcohol, or use illegal drugs. Some items may interact with your medicine. What should I watch for while using this medicine? Visit your doctor or health care professional for regular check ups. Check your blood pressure and pulse rate regularly. Ask your health care professional what your blood pressure and pulse rate should be, and when you should contact him or her. This medicine may make you feel confused, dizzy or lightheaded. Do not drive, use machinery, or do anything that needs mental  alertness until you know how this medicine affects you. To reduce the risk of dizzy or fainting spells, do not sit or stand up quickly, especially if you are an older patient. Avoid alcoholic drinks; they can make you more dizzy. Do not suddenly stop taking amlodipine. Ask your doctor or health care professional how you can gradually reduce the dose. What side effects may I notice from receiving this medicine? Side effects that you should report to your doctor or health care professional as soon as possible: -allergic reactions like skin rash, itching or hives, swelling of the face, lips, or tongue -breathing problems -changes in vision or hearing -chest pain -fast, irregular heartbeat -swelling of legs or ankles Side effects that usually do not require medical attention (report to your doctor or health care professional if they continue or are bothersome): -dry mouth -facial flushing -nausea, vomiting -stomach gas, pain -tired, weak -trouble sleeping This list may not describe all possible side effects. Call your doctor for medical advice about side effects. You may report side effects to FDA at 1-800-FDA-1088. Where should I keep my medicine? Keep out of the reach of children. Store at room temperature between 59 and 86 degrees F (15 and 30 degrees C). Protect from light. Keep container tightly closed. Throw away any unused medicine after the expiration date. NOTE: This sheet is a summary. It may not cover all possible information. If you have questions about this medicine, talk to your doctor, pharmacist, or health care provider.  2015, [employer]. (2012-06-16 11:40:58)

## 2014-05-11 NOTE — ED Notes (Signed)
Pt states was started on lisinopril 10mg  2wks ago, c/o severe headache every night after he takes it. States took ibuprofen 600mg  with no relief

## 2014-05-11 NOTE — ED Notes (Signed)
C/o ha onset 3 hours after taking bp meds past 2 weeks,

## 2014-06-14 ENCOUNTER — Emergency Department (HOSPITAL_BASED_OUTPATIENT_CLINIC_OR_DEPARTMENT_OTHER)
Admission: EM | Admit: 2014-06-14 | Discharge: 2014-06-14 | Disposition: A | Discharge status: Home / Self Care | Payer: No Typology Code available for payment source | Attending: Emergency Medicine | Admitting: Emergency Medicine

## 2014-06-14 ENCOUNTER — Encounter (HOSPITAL_BASED_OUTPATIENT_CLINIC_OR_DEPARTMENT_OTHER): Payer: Self-pay | Admitting: *Deleted

## 2014-06-14 DIAGNOSIS — Z720 Tobacco use: Secondary | ICD-10-CM | POA: Diagnosis not present

## 2014-06-14 DIAGNOSIS — L03115 Cellulitis of right lower limb: Secondary | ICD-10-CM | POA: Diagnosis not present

## 2014-06-14 DIAGNOSIS — Z792 Long term (current) use of antibiotics: Secondary | ICD-10-CM | POA: Diagnosis not present

## 2014-06-14 DIAGNOSIS — I10 Essential (primary) hypertension: Secondary | ICD-10-CM | POA: Insufficient documentation

## 2014-06-14 DIAGNOSIS — Z79899 Other long term (current) drug therapy: Secondary | ICD-10-CM | POA: Diagnosis not present

## 2014-06-14 DIAGNOSIS — Z760 Encounter for issue of repeat prescription: Secondary | ICD-10-CM | POA: Diagnosis not present

## 2014-06-14 DIAGNOSIS — Z7952 Long term (current) use of systemic steroids: Secondary | ICD-10-CM | POA: Insufficient documentation

## 2014-06-14 DIAGNOSIS — Z791 Long term (current) use of non-steroidal anti-inflammatories (NSAID): Secondary | ICD-10-CM | POA: Insufficient documentation

## 2014-06-14 LAB — BASIC METABOLIC PANEL
Anion gap: 14 (ref 5–15)
BUN: 19 mg/dL (ref 6–23)
CALCIUM: 9.8 mg/dL (ref 8.4–10.5)
CHLORIDE: 101 meq/L (ref 96–112)
CO2: 24 mEq/L (ref 19–32)
CREATININE: 1.3 mg/dL (ref 0.50–1.35)
GFR calc non Af Amer: 62 mL/min — ABNORMAL LOW (ref >90)
GFR, EST AFRICAN AMERICAN: 71 mL/min — AB (ref >90)
Glucose, Bld: 95 mg/dL (ref 70–99)
Potassium: 4.1 mEq/L (ref 3.7–5.3)
Sodium: 139 mEq/L (ref 137–147)

## 2014-06-14 MED ORDER — HYDROCHLOROTHIAZIDE 25 MG PO TABS: 25.0000 mg | ORAL_TABLET | Freq: Every day | ORAL | Status: DC

## 2014-06-14 MED ORDER — AMLODIPINE BESYLATE 5 MG PO TABS: 5.0000 mg | ORAL_TABLET | Freq: Every day | ORAL | Status: DC

## 2014-06-14 MED ORDER — CLINDAMYCIN HCL 300 MG PO CAPS: 300.0000 mg | ORAL_CAPSULE | Freq: Three times a day (TID) | ORAL | Status: DC

## 2014-06-14 MED ORDER — PREDNISONE 10 MG PO TABS: ORAL_TABLET | ORAL | Status: DC

## 2014-06-14 NOTE — Discharge Instructions (Signed)
Hypertension Hypertension is another name for high blood pressure. High blood pressure forces your heart to work harder to pump blood. A blood pressure reading has two numbers, which includes a higher number over a lower number (example: 110/72). HOME CARE   Have your blood pressure rechecked by your doctor.  Only take medicine as told by your doctor. Follow the directions carefully. The medicine does not work as well if you skip doses. Skipping doses also puts you at risk for problems.  Do not smoke.  Monitor your blood pressure at home as told by your doctor. GET HELP IF:  You think you are having a reaction to the medicine you are taking.  You have repeat headaches or feel dizzy.  You have puffiness (swelling) in your ankles.  You have trouble with your vision. GET HELP RIGHT AWAY IF:   You get a very bad headache and are confused.  You feel weak, numb, or faint.  You get chest or belly (abdominal) pain.  You throw up (vomit).  You cannot breathe very well. MAKE SURE YOU:   Understand these instructions.  Will watch your condition.  Will get help right away if you are not doing well or get worse. Document Released: 01/05/2008 Document Revised: 07/24/2013 Document Reviewed: 05/11/2013 [employer] Patient Information 2015 [employer]. This information is not intended to replace advice given to you by your health care provider. Make sure you discuss any questions you have with your health care provider.  

## 2014-06-14 NOTE — ED Notes (Signed)
Pt requesting NOrvasc 5 mg and also c/o rash

## 2014-06-14 NOTE — ED Provider Notes (Signed)
CSN: [id number]     Arrival date & time 06/14/14  1556 History   First MD Initiated Contact with Patient 06/14/14 1620     Chief Complaint  Patient presents with  . Medication Refill     (Consider location/radiation/quality/duration/timing/severity/associated sxs/prior Treatment) HPI Comments: Pt comes in today with complaint of rash to left lower leg times a couple of days. Rash is very itchy, no pain associated with it.states that he felt like something bit him and he has been scratching the area. He is also here to have bp medication rechecked. Denies cp,sob, blurred vision. He states that he has an appointment with a new pcp in december  The history is provided by the patient. No language interpreter was used.    Past Medical History  Diagnosis Date  . Hypertension    History reviewed. No pertinent past surgical history. History reviewed. No pertinent family history. History  Substance Use Topics  . Smoking status: Current Every Day Smoker -- 0.50 packs/day for 25 years    Types: Cigarettes  . Smokeless tobacco: Never Used  . Alcohol Use: No    Review of Systems  All other systems reviewed and are negative.     Allergies  Bee venom and Shrimp  Home Medications   Prior to Admission medications   Medication Sig Start Date End Date Taking? Authorizing Provider  amLODipine (NORVASC) 5 MG tablet Take 1 tablet (5 mg total) by mouth daily. 06/14/14   Teressa LowerVrinda Collen Hostler, NP  clindamycin (CLEOCIN) 300 MG capsule Take 1 capsule (300 mg total) by mouth 3 (three) times daily. 06/14/14   Teressa LowerVrinda Merriam Brandner, NP  hydrochlorothiazide (HYDRODIURIL) 25 MG tablet Take 1 tablet (25 mg total) by mouth daily. 06/14/14   Teressa LowerVrinda Daizha Anand, NP  lisinopril (PRINIVIL,ZESTRIL) 10 MG tablet Take 10 mg by mouth daily.    Historical Provider, MD  meloxicam (MOBIC) 15 MG tablet Take 1 tablet (15 mg total) by mouth daily. 05/16/13   Charles B. Bernette MayersSheldon, MD  predniSONE (DELTASONE) 10 MG tablet 6 day  step down dose 06/14/14   Teressa LowerVrinda Corina Stacy, NP   BP 164/116 mmHg  Pulse 94  Temp(Src) 97.9 F (36.6 C) (Oral)  Resp 16  Ht 5\' 6"  (1.676 m)  Wt 258 lb (117.028 kg)  BMI 41.66 kg/m2  SpO2 97% Physical Exam  Constitutional: He is oriented to person, place, and time. He appears well-developed and well-nourished.  Cardiovascular: Normal rate and regular rhythm.   Pulmonary/Chest: Effort normal and breath sounds normal.  Musculoskeletal: Normal range of motion.  Neurological: He is alert and oriented to person, place, and time.  Skin:  Raised papular bumps noted with some open raw areas noted with redness and warmth surrounding the area. No drainage. - homan sign  Nursing note and vitals reviewed.   ED Course  Procedures (including critical care time) Labs Review Labs Reviewed  BASIC METABOLIC PANEL - Abnormal; Notable for the following:    GFR calc non Af Amer 62 (*)    GFR calc Af Amer 71 (*)    All other components within normal limits    Imaging Review No results found.   EKG Interpretation None      MDM   Final diagnoses:  Essential hypertension  Cellulitis of right lower extremity    Rash consistent with dermatitis that has been itching raw causing cellulitis. Pt given bp medications.   Teressa LowerVrinda Lailyn Appelbaum, NP 06/14/14 1734  Warnell Foresterrey Wofford, MD 06/14/14 [phone]

## 2016-04-16 ENCOUNTER — Emergency Department (HOSPITAL_BASED_OUTPATIENT_CLINIC_OR_DEPARTMENT_OTHER)
Admission: EM | Admit: 2016-04-16 | Discharge: 2016-04-16 | Disposition: A | Discharge status: Home / Self Care | Payer: BLUE CROSS/BLUE SHIELD | Attending: Emergency Medicine | Admitting: Emergency Medicine

## 2016-04-16 ENCOUNTER — Encounter (HOSPITAL_BASED_OUTPATIENT_CLINIC_OR_DEPARTMENT_OTHER): Payer: Self-pay | Admitting: Emergency Medicine

## 2016-04-16 DIAGNOSIS — K029 Dental caries, unspecified: Secondary | ICD-10-CM | POA: Diagnosis not present

## 2016-04-16 DIAGNOSIS — Z760 Encounter for issue of repeat prescription: Secondary | ICD-10-CM | POA: Diagnosis not present

## 2016-04-16 DIAGNOSIS — I10 Essential (primary) hypertension: Secondary | ICD-10-CM | POA: Diagnosis not present

## 2016-04-16 DIAGNOSIS — F17210 Nicotine dependence, cigarettes, uncomplicated: Secondary | ICD-10-CM | POA: Diagnosis not present

## 2016-04-16 DIAGNOSIS — K0889 Other specified disorders of teeth and supporting structures: Secondary | ICD-10-CM | POA: Diagnosis present

## 2016-04-16 MED ORDER — PENICILLIN V POTASSIUM 250 MG PO TABS
500.0000 mg | ORAL_TABLET | Freq: Once | ORAL | Status: AC
  Administered 2016-04-16: 500 mg via ORAL
  Filled 2016-04-16: qty 2

## 2016-04-16 MED ORDER — DICLOFENAC SODIUM ER 100 MG PO TB24: 100.0000 mg | ORAL_TABLET | Freq: Every day | ORAL | 0 refills | Status: DC

## 2016-04-16 MED ORDER — PENICILLIN V POTASSIUM 500 MG PO TABS: 500.0000 mg | ORAL_TABLET | Freq: Four times a day (QID) | ORAL | 0 refills | Status: AC

## 2016-04-16 MED ORDER — NAPROXEN 250 MG PO TABS
500.0000 mg | ORAL_TABLET | Freq: Once | ORAL | Status: AC
  Administered 2016-04-16: 500 mg via ORAL
  Filled 2016-04-16: qty 2

## 2016-04-16 MED ORDER — AMLODIPINE BESYLATE 5 MG PO TABS
5.0000 mg | ORAL_TABLET | Freq: Once | ORAL | Status: AC
  Administered 2016-04-16: 5 mg via ORAL
  Filled 2016-04-16: qty 1

## 2016-04-16 MED ORDER — AMLODIPINE BESYLATE 5 MG PO TABS: 5.0000 mg | ORAL_TABLET | Freq: Every day | ORAL | 0 refills | Status: DC

## 2016-04-16 NOTE — ED Triage Notes (Signed)
Pt reports tooth pain right upper molar area reports hx of gum disease

## 2016-04-16 NOTE — ED Provider Notes (Signed)
[facility]   CSN: [id number] Arrival date & time: 04/16/16  0607     History   Chief Complaint Chief Complaint  Patient presents with  . Dental Pain    HPI Ricky Boyle is a 55 y.o. male.  The history is provided by the patient.  Dental Pain   This is a new problem. The current episode started 6 to 12 hours ago. The problem occurs constantly. The problem has not changed since onset.The pain is moderate. He has tried nothing for the symptoms. The treatment provided no relief.    Past Medical History:  Diagnosis Date  . Hypertension     There are no active problems to display for this patient.   History reviewed. No pertinent surgical history.     Home Medications    Prior to Admission medications   Medication Sig Start Date End Date Taking? Authorizing Provider  amLODipine (NORVASC) 5 MG tablet Take 1 tablet (5 mg total) by mouth daily. 06/14/14   Teressa LowerVrinda Pickering, NP  clindamycin (CLEOCIN) 300 MG capsule Take 1 capsule (300 mg total) by mouth 3 (three) times daily. 06/14/14   Teressa LowerVrinda Pickering, NP  hydrochlorothiazide (HYDRODIURIL) 25 MG tablet Take 1 tablet (25 mg total) by mouth daily. 06/14/14   Teressa LowerVrinda Pickering, NP  lisinopril (PRINIVIL,ZESTRIL) 10 MG tablet Take 10 mg by mouth daily.    Historical Provider, MD  meloxicam (MOBIC) 15 MG tablet Take 1 tablet (15 mg total) by mouth daily. 05/16/13   Susy Frizzleharles Sheldon, MD  predniSONE (DELTASONE) 10 MG tablet 6 day step down dose 06/14/14   Teressa LowerVrinda Pickering, NP    Family History History reviewed. No pertinent family history.  Social History Social History  Substance Use Topics  . Smoking status: Current Every Day Smoker    Packs/day: 0.50    Years: 25.00    Types: Cigarettes  . Smokeless tobacco: Never Used  . Alcohol use No     Allergies   Bee venom and Shrimp [shellfish allergy]   Review of Systems Review of Systems  HENT: Positive for dental problem.   Respiratory:  Negative for shortness of breath.   Cardiovascular: Negative for chest pain.  All other systems reviewed and are negative.    Physical Exam Updated Vital Signs BP (!) 195/116 (BP Location: Right Arm)   Temp 98.7 F (37.1 C) (Oral)   Resp 16   Ht 5\' 6"  (1.676 m)   Wt 250 lb (113.4 kg)   SpO2 100%   BMI 40.35 kg/m   Physical Exam  Constitutional: He is oriented to person, place, and time. He appears well-developed and well-nourished. No distress.  HENT:  Head: Normocephalic and atraumatic.  Dental caries  Eyes: EOM are normal. Pupils are equal, round, and reactive to light.  Neck: Normal range of motion. Neck supple.  Cardiovascular: Normal rate and regular rhythm.   Pulmonary/Chest: Effort normal and breath sounds normal.  Abdominal: Soft. Bowel sounds are normal. There is no tenderness.  Musculoskeletal: Normal range of motion.  Neurological: He is alert and oriented to person, place, and time.  Skin: Skin is warm and dry. Capillary refill takes less than 2 seconds.     ED Treatments / Results  Labs (all labs ordered are listed, but only abnormal results are displayed) Labs Reviewed - No data to display  EKG  EKG Interpretation None       Radiology No results found.  Procedures Procedures (including critical care time)  Medications Ordered in  ED Medications  amLODipine (NORVASC) tablet 5 mg (not administered)  penicillin v potassium (VEETID) tablet 500 mg (not administered)  naproxen (NAPROSYN) tablet 500 mg (not administered)     Initial Impression / Assessment and Plan / ED Course  I have reviewed the triage vital signs and the nursing notes.  Pertinent labs & imaging results that were available during my care of the patient were reviewed by me and considered in my medical decision making (see chart for details).   Final Clinical Impressions(s) / ED Diagnoses   Final diagnoses:  None    New Prescriptions New Prescriptions   No medications on  file   Vitals:   04/16/16 0614  BP: (!) 195/116  Resp: 16  Temp: 98.7 F (37.1 C)   Will give a refill on patient's norvasc. All questions answered to patient's satisfaction. Based on history and exam patient has been appropriately medically screened and emergency conditions excluded. Patient is stable for discharge at this time. Follow up with your PMD for recheck in 2 days and strict return precautions given.   Cy Blamer, MD 04/16/16 [phone]

## 2016-05-26 ENCOUNTER — Encounter (HOSPITAL_BASED_OUTPATIENT_CLINIC_OR_DEPARTMENT_OTHER): Payer: Self-pay | Admitting: *Deleted

## 2016-05-26 ENCOUNTER — Emergency Department (HOSPITAL_BASED_OUTPATIENT_CLINIC_OR_DEPARTMENT_OTHER)
Admission: EM | Admit: 2016-05-26 | Discharge: 2016-05-26 | Disposition: A | Discharge status: Home / Self Care | Payer: BLUE CROSS/BLUE SHIELD | Attending: Emergency Medicine | Admitting: Emergency Medicine

## 2016-05-26 DIAGNOSIS — Z760 Encounter for issue of repeat prescription: Secondary | ICD-10-CM | POA: Insufficient documentation

## 2016-05-26 DIAGNOSIS — Z79899 Other long term (current) drug therapy: Secondary | ICD-10-CM | POA: Diagnosis not present

## 2016-05-26 DIAGNOSIS — F17210 Nicotine dependence, cigarettes, uncomplicated: Secondary | ICD-10-CM | POA: Diagnosis not present

## 2016-05-26 DIAGNOSIS — R51 Headache: Secondary | ICD-10-CM

## 2016-05-26 DIAGNOSIS — R519 Headache, unspecified: Secondary | ICD-10-CM

## 2016-05-26 DIAGNOSIS — I10 Essential (primary) hypertension: Secondary | ICD-10-CM | POA: Diagnosis not present

## 2016-05-26 MED ORDER — AMLODIPINE BESY-BENAZEPRIL HCL 10-20 MG PO CAPS: 1.0000 | ORAL_CAPSULE | Freq: Every day | ORAL | 0 refills | Status: DC

## 2016-05-26 MED FILL — AMLODIPINE-BENAZEPRIL 10-20: 10-20 | 30 days supply | Qty: 30 | Fill #0

## 2016-05-26 NOTE — ED Notes (Signed)
Call in to [facility] for correct dose of Norvasc . Pt last filled norvasc/bezapril 10/20

## 2016-05-26 NOTE — ED Provider Notes (Signed)
[facility] Provider Note   CSN: [id number] Arrival date & time: 05/26/16  1439     History   Chief Complaint Chief Complaint  Patient presents with  . Headache    HPI Ricky Boyle is a [age] y.o. male.  HPI Patient states he recently ran out of his blood pressure medication. Gradual onset of frontal headache this morning while at work. Patient states he's had similar headaches in the past when his blood pressure is elevated. He denies any visual changes, nausea, vomiting, weakness or numbness. States his headache is improved. He is requesting refill of his blood pressure medication. Has appointment to see his primary physician in mid November. Past Medical History:  Diagnosis Date  . Hypertension     There are no active problems to display for this patient.   History reviewed. No pertinent surgical history.     Home Medications    Prior to Admission medications   Medication Sig Start Date End Date Taking? Authorizing Provider  amLODipine (NORVASC) 5 MG tablet Take 1 tablet (5 mg total) by mouth daily. 04/16/16  Yes April Palumbo, MD  amLODipine-benazepril (LOTREL) 10-20 MG capsule Take 1 capsule by mouth daily. 05/26/16   Loren Raceravid Drayven Marchena, MD  clindamycin (CLEOCIN) 300 MG capsule Take 1 capsule (300 mg total) by mouth 3 (three) times daily. 06/14/14   Teressa LowerVrinda Pickering, NP  Diclofenac Sodium CR (VOLTAREN-XR) 100 MG 24 hr tablet Take 1 tablet (100 mg total) by mouth daily. 04/16/16   April Palumbo, MD  hydrochlorothiazide (HYDRODIURIL) 25 MG tablet Take 1 tablet (25 mg total) by mouth daily. 06/14/14   Teressa LowerVrinda Pickering, NP  lisinopril (PRINIVIL,ZESTRIL) 10 MG tablet Take 10 mg by mouth daily.    Historical Provider, MD  meloxicam (MOBIC) 15 MG tablet Take 1 tablet (15 mg total) by mouth daily. 05/16/13   Susy Frizzleharles Sheldon, MD  predniSONE (DELTASONE) 10 MG tablet 6 day step down dose 06/14/14   Teressa LowerVrinda Pickering, NP    Family History No family history on  file.  Social History Social History  Substance Use Topics  . Smoking status: Current Every Day Smoker    Packs/day: 0.50    Years: 25.00    Types: Cigarettes  . Smokeless tobacco: Never Used  . Alcohol use No     Allergies   Bee venom and Shrimp [shellfish allergy]   Review of Systems Review of Systems  Constitutional: Negative for chills, fatigue and fever.  HENT: Negative for facial swelling and sore throat.   Eyes: Negative for photophobia and visual disturbance.  Respiratory: Negative for cough, shortness of breath and wheezing.   Cardiovascular: Negative for chest pain, palpitations and leg swelling.  Gastrointestinal: Negative for abdominal pain, constipation, diarrhea, nausea and vomiting.  Genitourinary: Negative for dysuria, flank pain, frequency and hematuria.  Musculoskeletal: Negative for back pain, myalgias, neck pain and neck stiffness.  Skin: Negative for rash and wound.  Neurological: Positive for headaches. Negative for dizziness, syncope, speech difficulty, weakness, light-headedness and numbness.  All other systems reviewed and are negative.    Physical Exam Updated Vital Signs BP (!) 186/110 (BP Location: Left Arm)   Pulse 92   Temp 97.9 F (36.6 C) (Oral)   Resp 18   Ht 5\' 6"  (1.676 m)   Wt 250 lb (113.4 kg)   SpO2 96%   BMI 40.35 kg/m   Physical Exam  Constitutional: He is oriented to person, place, and time. He appears well-developed and well-nourished. No distress.  HENT:  Head: Normocephalic and atraumatic.  Mouth/Throat: Oropharynx is clear and moist.  No sinus tenderness with percussion.  Eyes: EOM are normal. Pupils are equal, round, and reactive to light.  Neck: Normal range of motion. Neck supple.  Cardiovascular: Normal rate and regular rhythm.  Exam reveals no gallop and no friction rub.   No murmur heard. Pulmonary/Chest: Effort normal and breath sounds normal.  Abdominal: Soft. Bowel sounds are normal. There is no  tenderness. There is no rebound and no guarding.  Musculoskeletal: Normal range of motion. He exhibits no edema or tenderness.  Neurological: He is alert and oriented to person, place, and time.  Patient is alert and oriented x3 with clear, goal oriented speech. Patient has 5/5 motor in all extremities. Sensation is intact to light touch. Bilateral finger-to-nose is normal with no signs of dysmetria. Patient has a normal gait and walks without assistance.  Skin: Skin is warm and dry. Capillary refill takes less than 2 seconds. No rash noted. No erythema.  Psychiatric: He has a normal mood and affect. His behavior is normal.  Nursing note and vitals reviewed.    ED Treatments / Results  Labs (all labs ordered are listed, but only abnormal results are displayed) Labs Reviewed - No data to display  EKG  EKG Interpretation None       Radiology No results found.  Procedures Procedures (including critical care time)  Medications Ordered in ED Medications - No data to display   Initial Impression / Assessment and Plan / ED Course  I have reviewed the triage vital signs and the nursing notes.  Pertinent labs & imaging results that were available during my care of the patient were reviewed by me and considered in my medical decision making (see chart for details).  Clinical Course   Patient presents with elevated blood pressure and headache which is similar to previous headaches. Has normal neurologic exam. No red flag signs or symptoms.  Final Clinical Impressions(s) / ED Diagnoses   Final diagnoses:  Hypertension, unspecified type  Nonintractable headache, unspecified chronicity pattern, unspecified headache type  Medication refill    New Prescriptions New Prescriptions   AMLODIPINE-BENAZEPRIL (LOTREL) 10-20 MG CAPSULE    Take 1 capsule by mouth daily.     Loren Racer, MD 05/26/16 [phone]

## 2016-05-26 NOTE — ED Triage Notes (Signed)
Pt reports headache starting this am while at work around 1000. States he took his last norvasc at that time

## 2016-05-26 NOTE — ED Triage Notes (Signed)
Pt states he starts seeing new PCP on November 18th. Needs rx for b/p med until then

## 2016-05-26 NOTE — ED Notes (Signed)
MD at bedside. 

## 2016-06-16 DIAGNOSIS — G4733 Obstructive sleep apnea (adult) (pediatric): Secondary | ICD-10-CM | POA: Insufficient documentation

## 2017-10-19 ENCOUNTER — Emergency Department (HOSPITAL_COMMUNITY)
Admission: EM | Admit: 2017-10-19 | Discharge: 2017-10-19 | Disposition: A | Discharge status: Home / Self Care | Payer: No Typology Code available for payment source | Attending: Emergency Medicine | Admitting: Emergency Medicine

## 2017-10-19 ENCOUNTER — Other Ambulatory Visit: Payer: Self-pay

## 2017-10-19 ENCOUNTER — Encounter (HOSPITAL_COMMUNITY): Payer: Self-pay

## 2017-10-19 DIAGNOSIS — R05 Cough: Secondary | ICD-10-CM | POA: Diagnosis present

## 2017-10-19 DIAGNOSIS — J111 Influenza due to unidentified influenza virus with other respiratory manifestations: Secondary | ICD-10-CM | POA: Insufficient documentation

## 2017-10-19 DIAGNOSIS — I10 Essential (primary) hypertension: Secondary | ICD-10-CM | POA: Diagnosis not present

## 2017-10-19 DIAGNOSIS — F17210 Nicotine dependence, cigarettes, uncomplicated: Secondary | ICD-10-CM | POA: Diagnosis not present

## 2017-10-19 DIAGNOSIS — R69 Illness, unspecified: Secondary | ICD-10-CM

## 2017-10-19 DIAGNOSIS — Z79899 Other long term (current) drug therapy: Secondary | ICD-10-CM | POA: Diagnosis not present

## 2017-10-19 MED ORDER — FLUTICASONE PROPIONATE 50 MCG/ACT NA SUSP: 1.0000 | Freq: Every day | NASAL | 2 refills | Status: DC

## 2017-10-19 MED ORDER — BENZONATATE 100 MG PO CAPS: 200.0000 mg | ORAL_CAPSULE | Freq: Three times a day (TID) | ORAL | 0 refills | Status: DC

## 2017-10-19 NOTE — ED Provider Notes (Signed)
[facility]   CSN: [id number] Arrival date & time: 10/19/17  1025     History   Chief Complaint No chief complaint on file.   HPI Ricky Boyle is a 57 y.o. male with a past medical history of hypertension, presents to ED for evaluation of 2-day history of generalized body aches, cough productive with clear mucus, subjective fever, nasal congestion.  Sick contacts at work with similar symptoms.  He has been taking over-the-counter medication.  States that he has had to miss work as a result of the sickness.  Did not receive his influenza vaccine this year.  Denies any hemoptysis, chest pain, shortness of breath, vomiting.  HPI  Past Medical History:  Diagnosis Date  . Hypertension     There are no active problems to display for this patient.   History reviewed. No pertinent surgical history.     Home Medications    Prior to Admission medications   Medication Sig Start Date End Date Taking? Authorizing Provider  amLODipine (NORVASC) 5 MG tablet Take 1 tablet (5 mg total) by mouth daily. 04/16/16   Palumbo, April, MD  amLODipine-benazepril (LOTREL) 10-20 MG capsule Take 1 capsule by mouth daily. 05/26/16   Loren RacerYelverton, David, MD  benzonatate (TESSALON) 100 MG capsule Take 2 capsules (200 mg total) by mouth every 8 (eight) hours. 10/19/17   Alayzia Pavlock, PA-C  clindamycin (CLEOCIN) 300 MG capsule Take 1 capsule (300 mg total) by mouth 3 (three) times daily. 06/14/14   Teressa LowerPickering, Vrinda, NP  Diclofenac Sodium CR (VOLTAREN-XR) 100 MG 24 hr tablet Take 1 tablet (100 mg total) by mouth daily. 04/16/16   Palumbo, April, MD  fluticasone (FLONASE) 50 MCG/ACT nasal spray Place 1 spray into both nostrils daily. 10/19/17   Kortlynn Poust, PA-C  hydrochlorothiazide (HYDRODIURIL) 25 MG tablet Take 1 tablet (25 mg total) by mouth daily. 06/14/14   Teressa LowerPickering, Vrinda, NP  lisinopril (PRINIVIL,ZESTRIL) 10 MG tablet Take 10 mg by mouth daily.     [provider]  meloxicam (MOBIC) 15 MG tablet Take 1 tablet (15 mg total) by mouth daily. 05/16/13   Susy FrizzleSheldon, Charles, MD  predniSONE (DELTASONE) 10 MG tablet 6 day step down dose 06/14/14   Teressa LowerPickering, Vrinda, NP    Family History No family history on file.  Social History Social History   Tobacco Use  . Smoking status: Current Every Day Smoker    Packs/day: 0.50    Years: 25.00    Pack years: 12.50    Types: Cigarettes  . Smokeless tobacco: Never Used  Substance Use Topics  . Alcohol use: No  . Drug use: No     Allergies   Bee venom and Shrimp [shellfish allergy]   Review of Systems Review of Systems  Constitutional: Positive for chills. Negative for fever.  HENT: Positive for congestion and rhinorrhea. Negative for facial swelling, hearing loss, sinus pressure, sneezing, sore throat and voice change.   Respiratory: Positive for cough. Negative for shortness of breath.   Cardiovascular: Negative for chest pain.  Musculoskeletal: Positive for myalgias.     Physical Exam Updated Vital Signs BP (!) 156/83   Pulse 100   Temp 98.7 F (37.1 C) (Oral)   Resp 18   SpO2 96%   Physical Exam  Constitutional: He appears well-developed and well-nourished. No distress.  HENT:  Head: Normocephalic and atraumatic.  Right Ear: Tympanic membrane is erythematous. A middle ear effusion is present.  Left Ear: Tympanic membrane is  not erythematous. A middle ear effusion is present.  Nose: Mucosal edema present.  Mouth/Throat: Uvula is midline. No trismus in the jaw. No uvula swelling. No posterior oropharyngeal edema or posterior oropharyngeal erythema. No tonsillar exudate.  Eyes: Conjunctivae and EOM are normal. No scleral icterus.  Neck: Normal range of motion.  Cardiovascular: Normal rate, regular rhythm and normal heart sounds.  Pulmonary/Chest: Effort normal. No respiratory distress.  Neurological: He is alert.  Skin: No rash noted. He is not diaphoretic.    Psychiatric: He has a normal mood and affect.  Nursing note and vitals reviewed.    ED Treatments / Results  Labs (all labs ordered are listed, but only abnormal results are displayed) Labs Reviewed - No data to display  EKG  EKG Interpretation None       Radiology No results found.  Procedures Procedures (including critical care time)  Medications Ordered in ED Medications - No data to display   Initial Impression / Assessment and Plan / ED Course  I have reviewed the triage vital signs and the nursing notes.  Pertinent labs & imaging results that were available during my care of the patient were reviewed by me and considered in my medical decision making (see chart for details).     Patient presents to ED for evaluation of influenza-like illness for the past 2 days.  He reports subjective fever, nasal congestion, cough and generalized body aches.  Denies receive his influenza vaccine this year but does have sick contacts with similar symptoms.  Here he is afebrile with no use of antipyretics.  Her lungs are clear to auscultation bilaterally.  He appears overall well.  He has no evidence of infectious etiology of the pharynx.  Suspect that his symptoms could be due to influenza or influenza-like illness.  He is out of the window for Tamiflu administration so will give symptomatic treatment with antitussives and nasal spray advised him to take antipyretics as needed to help with body aches and fever.  I doubt pneumonia, bacterial cause of symptoms.  Patient appears stable for discharge at this time.  Strict return precautions given.   Portions of this note were generated with Scientist, clinical (histocompatibility and immunogenetics). Dictation errors may occur despite best attempts at proofreading.   Final Clinical Impressions(s) / ED Diagnoses   Final diagnoses:  Influenza-like illness    ED Discharge Orders        Ordered    benzonatate (TESSALON) 100 MG capsule  Every 8 hours     10/19/17 1214     fluticasone (FLONASE) 50 MCG/ACT nasal spray  Daily     10/19/17 [address] PA-C 10/19/17 1217    Jacalyn Lefevre, MD 10/19/17 1219

## 2017-10-19 NOTE — ED Triage Notes (Signed)
Patient complains of body aches with cough x 3 days, describes as non-productive. Alert and orienrted

## 2018-06-07 ENCOUNTER — Encounter (HOSPITAL_COMMUNITY): Payer: Self-pay

## 2018-06-07 ENCOUNTER — Emergency Department (HOSPITAL_COMMUNITY)
Admission: EM | Admit: 2018-06-07 | Discharge: 2018-06-07 | Disposition: A | Discharge status: Home / Self Care | Payer: No Typology Code available for payment source | Attending: Emergency Medicine | Admitting: Emergency Medicine

## 2018-06-07 DIAGNOSIS — Z79899 Other long term (current) drug therapy: Secondary | ICD-10-CM | POA: Insufficient documentation

## 2018-06-07 DIAGNOSIS — I10 Essential (primary) hypertension: Secondary | ICD-10-CM | POA: Insufficient documentation

## 2018-06-07 DIAGNOSIS — F17210 Nicotine dependence, cigarettes, uncomplicated: Secondary | ICD-10-CM | POA: Insufficient documentation

## 2018-06-07 DIAGNOSIS — I160 Hypertensive urgency: Secondary | ICD-10-CM

## 2018-06-07 LAB — BASIC METABOLIC PANEL
ANION GAP: 7 (ref 5–15)
BUN: 22 mg/dL — ABNORMAL HIGH (ref 6–20)
CALCIUM: 9 mg/dL (ref 8.9–10.3)
CHLORIDE: 103 mmol/L (ref 98–111)
CO2: 27 mmol/L (ref 22–32)
Creatinine, Ser: 1.66 mg/dL — ABNORMAL HIGH (ref 0.61–1.24)
GFR calc non Af Amer: 45 mL/min — ABNORMAL LOW (ref >60)
GFR, EST AFRICAN AMERICAN: 52 mL/min — AB (ref >60)
Glucose, Bld: 99 mg/dL (ref 70–99)
Potassium: 3.9 mmol/L (ref 3.5–5.1)
Sodium: 137 mmol/L (ref 135–145)

## 2018-06-07 LAB — CBC WITH DIFFERENTIAL/PLATELET
Abs Immature Granulocytes: 0.03 10*3/uL (ref 0.00–0.07)
BASOS ABS: 0.1 10*3/uL (ref 0.0–0.1)
Basophils Relative: 1 %
Eosinophils Absolute: 0.4 10*3/uL (ref 0.0–0.5)
Eosinophils Relative: 3 %
HEMATOCRIT: 52.3 % — AB (ref 39.0–52.0)
HEMOGLOBIN: 17.2 g/dL — AB (ref 13.0–17.0)
Immature Granulocytes: 0 %
LYMPHS ABS: 4.6 10*3/uL — AB (ref 0.7–4.0)
LYMPHS PCT: 41 %
MCH: 32.1 pg (ref 26.0–34.0)
MCHC: 32.9 g/dL (ref 30.0–36.0)
MCV: 97.8 fL (ref 80.0–100.0)
MONOS PCT: 9 %
Monocytes Absolute: 1 10*3/uL (ref 0.1–1.0)
NEUTROS PCT: 46 %
NRBC: 0 % (ref 0.0–0.2)
Neutro Abs: 5.3 10*3/uL (ref 1.7–7.7)
Platelets: 223 10*3/uL (ref 150–400)
RBC: 5.35 MIL/uL (ref 4.22–5.81)
RDW: 12.5 % (ref 11.5–15.5)
WBC: 11.3 10*3/uL — ABNORMAL HIGH (ref 4.0–10.5)

## 2018-06-07 LAB — URINALYSIS, ROUTINE W REFLEX MICROSCOPIC
Bilirubin Urine: NEGATIVE
Glucose, UA: NEGATIVE mg/dL
Hgb urine dipstick: NEGATIVE
Ketones, ur: NEGATIVE mg/dL
Leukocytes, UA: NEGATIVE
NITRITE: NEGATIVE
PH: 5 (ref 5.0–8.0)
Protein, ur: NEGATIVE mg/dL
SPECIFIC GRAVITY, URINE: 1.023 (ref 1.005–1.030)

## 2018-06-07 LAB — I-STAT TROPONIN, ED: TROPONIN I, POC: 0.01 ng/mL (ref 0.00–0.08)

## 2018-06-07 MED ORDER — HYDROCHLOROTHIAZIDE 25 MG PO TABS: 25.0000 mg | ORAL_TABLET | Freq: Every day | ORAL | 0 refills | Status: DC

## 2018-06-07 MED ORDER — HYDROCHLOROTHIAZIDE 25 MG PO TABS
25.0000 mg | ORAL_TABLET | Freq: Once | ORAL | Status: AC
  Administered 2018-06-07: 25 mg via ORAL
  Filled 2018-06-07: qty 1

## 2018-06-07 MED ORDER — LISINOPRIL 10 MG PO TABS: 10.0000 mg | ORAL_TABLET | Freq: Every day | ORAL | 0 refills | Status: DC

## 2018-06-07 NOTE — ED Triage Notes (Signed)
Pt arrived via [facility] from work c/o HTN, headache and tingling in right hand. Pt has been out of BP meds x2 days.

## 2018-06-07 NOTE — ED Provider Notes (Signed)
[facility] EMERGENCY DEPARTMENT Provider Note   CSN: [id number] Arrival date & time: 06/07/18  1430     History   Chief Complaint Chief Complaint  Patient presents with  . Hypertension    HPI Ricky Boyle is a 57 y.o. male.  HPI Patient presents with concern of headache, numbness and tingling in the right hand. Patient notes there is a history of hypertension, ran out of his medication a few weeks ago He was previously on amlodipine. 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 He has some suspicion of a possible gas leak, though this is unconfirmed. He notes that the headache began a few hours ago, and was initially severe, though not sudden in onset. Headache is improved as the patient has transported here. No true weakness in his extremities, no confusion, disorientation, chest pain, dyspnea.  Past Medical History:  Diagnosis Date  . Hypertension     There are no active problems to display for this patient.   History reviewed. No pertinent surgical history.      Home Medications    Prior to Admission medications   Medication Sig Start Date End Date Taking? Authorizing Provider  amLODipine (NORVASC) 5 MG tablet Take 1 tablet (5 mg total) by mouth daily. 04/16/16   Palumbo, April, MD  amLODipine-benazepril (LOTREL) 10-20 MG capsule Take 1 capsule by mouth daily. 05/26/16   Loren Racer, MD  benzonatate (TESSALON) 100 MG capsule Take 2 capsules (200 mg total) by mouth every 8 (eight) hours. 10/19/17   Khatri, Hina, PA-C  clindamycin (CLEOCIN) 300 MG capsule Take 1 capsule (300 mg total) by mouth 3 (three) times daily. 06/14/14   Teressa Lower, NP  Diclofenac Sodium CR (VOLTAREN-XR) 100 MG 24 hr tablet Take 1 tablet (100 mg total) by mouth daily. 04/16/16   Palumbo, April, MD  fluticasone (FLONASE) 50 MCG/ACT nasal spray Place 1 spray into both nostrils daily. 10/19/17   Khatri, Hina, PA-C    hydrochlorothiazide (HYDRODIURIL) 25 MG tablet Take 1 tablet (25 mg total) by mouth daily. 06/14/14   Teressa Lower, NP  lisinopril (PRINIVIL,ZESTRIL) 10 MG tablet Take 10 mg by mouth daily.    [provider]  meloxicam (MOBIC) 15 MG tablet Take 1 tablet (15 mg total) by mouth daily. 05/16/13   Susy Frizzle, MD  predniSONE (DELTASONE) 10 MG tablet 6 day step down dose 06/14/14   Teressa Lower, NP    Family History History reviewed. No pertinent family history.  Social History Social History   Tobacco Use  . Smoking status: Current Every Day Smoker    Packs/day: 0.50    Years: 25.00    Pack years: 12.50    Types: Cigarettes  . Smokeless tobacco: Never Used  Substance Use Topics  . Alcohol use: No  . Drug use: No     Allergies   Bee venom and Shrimp [shellfish allergy]   Review of Systems Review of Systems  Constitutional:       Per HPI, otherwise negative  HENT:       Per HPI, otherwise negative  Respiratory:       Per HPI, otherwise negative  Cardiovascular:       Per HPI, otherwise negative  Gastrointestinal: Negative for vomiting.  Endocrine:       Negative aside from HPI  Genitourinary:       Neg aside from HPI   Musculoskeletal:       Per HPI, otherwise negative  Skin: Negative.   Neurological: Positive for numbness. Negative for syncope.     Physical Exam Updated Vital Signs BP (!) 202/109 (BP Location: Right Arm)   Pulse 71   Temp 98 F (36.7 C) (Oral)   Resp 17   Ht 5\' 6"  (1.676 m)   Wt 103.4 kg   SpO2 98%   BMI 36.80 kg/m   Physical Exam  Constitutional: He is oriented to person, place, and time. He appears well-developed. No distress.  HENT:  Head: Normocephalic and atraumatic.  Eyes: Conjunctivae and EOM are normal.  Cardiovascular: Normal rate and regular rhythm.  Pulmonary/Chest: Effort normal. No stridor. No respiratory distress.  Abdominal: He exhibits no distension.  Musculoskeletal: He exhibits no edema.   Neurological: He is alert and oriented to person, place, and time. He displays no atrophy and no tremor. No cranial nerve deficit. He exhibits normal muscle tone. He displays no seizure activity. Coordination normal.  Skin: Skin is warm and dry.  Psychiatric: He has a normal mood and affect.  Nursing note and vitals reviewed.    ED Treatments / Results  Labs (all labs ordered are listed, but only abnormal results are displayed) Labs Reviewed  BASIC METABOLIC PANEL - Abnormal; Notable for the following components:      Result Value   BUN 22 (*)    Creatinine, Ser 1.66 (*)    GFR calc non Af Amer 45 (*)    GFR calc Af Amer 52 (*)    All other components within normal limits  CBC WITH DIFFERENTIAL/PLATELET - Abnormal; Notable for the following components:   WBC 11.3 (*)    Hemoglobin 17.2 (*)    HCT 52.3 (*)    Lymphs Abs 4.6 (*)    All other components within normal limits  URINALYSIS, ROUTINE W REFLEX MICROSCOPIC  I-STAT TROPONIN, ED    EKG EKG Interpretation  Date/Time:  Wednesday June 07 2018 15:42:52 EST Ventricular Rate:  61 PR Interval:  146 QRS Duration: 98 QT Interval:  408 QTC Calculation: 410 R Axis:   -30 Text Interpretation:  Normal sinus rhythm Left axis deviation T wave abnormality Abnormal ekg Confirmed by Gerhard Munch [phone]) on 06/07/2018 5:24:17 PM   Procedures Procedures (including critical care time)  Medications Ordered in ED Medications  hydrochlorothiazide (HYDRODIURIL) tablet 25 mg (25 mg Oral Given 06/07/18 1455)     Initial Impression / Assessment and Plan / ED Course  I have reviewed the triage vital signs and the nursing notes.  Pertinent labs & imaging results that were available during my care of the patient were reviewed by me and considered in my medical decision making (see chart for details).     5:24 PM Patient in no distress, sleeping.  When he awakens easily.  When he does have some mild persistent hypertension, but  no evidence for endorgan effects, mild creatinine elevation. Patient encouraged to take his new antihypertensive medication regularly, provided resources with local follow-up.  On without distress, was sleeping status, low suspicion for atypical ACS. Patient discharged in stable condition.   Final Clinical Impressions(s) / ED Diagnoses  Hypertensive urgency   Gerhard Munch, MD 06/07/18 1725

## 2018-06-07 NOTE — ED Notes (Signed)
Pt stable, ambulatory, states understanding of discharge instructions 

## 2018-06-07 NOTE — Discharge Instructions (Signed)
As discussed, your evaluation today has been largely reassuring.  But, it is important that you monitor your condition carefully, and do not hesitate to return to the ED if you develop new, or concerning changes in your condition.  Please take all newly prescribed medication as directed and be sure to schedule follow-up appointment with our community health and wellness center.

## 2020-01-11 ENCOUNTER — Other Ambulatory Visit: Payer: Self-pay

## 2020-01-11 ENCOUNTER — Emergency Department (HOSPITAL_COMMUNITY)
Admission: EM | Admit: 2020-01-11 | Discharge: 2020-01-11 | Disposition: A | Discharge status: Home / Self Care | Payer: BLUE CROSS/BLUE SHIELD | Attending: Emergency Medicine | Admitting: Emergency Medicine

## 2020-01-11 ENCOUNTER — Emergency Department (HOSPITAL_COMMUNITY): Payer: BLUE CROSS/BLUE SHIELD

## 2020-01-11 DIAGNOSIS — I10 Essential (primary) hypertension: Secondary | ICD-10-CM | POA: Diagnosis not present

## 2020-01-11 DIAGNOSIS — Z79899 Other long term (current) drug therapy: Secondary | ICD-10-CM | POA: Diagnosis not present

## 2020-01-11 DIAGNOSIS — F17210 Nicotine dependence, cigarettes, uncomplicated: Secondary | ICD-10-CM | POA: Insufficient documentation

## 2020-01-11 DIAGNOSIS — R2243 Localized swelling, mass and lump, lower limb, bilateral: Secondary | ICD-10-CM | POA: Diagnosis not present

## 2020-01-11 DIAGNOSIS — R519 Headache, unspecified: Secondary | ICD-10-CM | POA: Diagnosis present

## 2020-01-11 LAB — CBC WITH DIFFERENTIAL/PLATELET
Abs Immature Granulocytes: 0.04 10*3/uL (ref 0.00–0.07)
Basophils Absolute: 0.1 10*3/uL (ref 0.0–0.1)
Basophils Relative: 0 %
Eosinophils Absolute: 0.2 10*3/uL (ref 0.0–0.5)
Eosinophils Relative: 2 %
HCT: 53.6 % — ABNORMAL HIGH (ref 39.0–52.0)
Hemoglobin: 17.4 g/dL — ABNORMAL HIGH (ref 13.0–17.0)
Immature Granulocytes: 0 %
Lymphocytes Relative: 36 %
Lymphs Abs: 4.2 10*3/uL — ABNORMAL HIGH (ref 0.7–4.0)
MCH: 31.9 pg (ref 26.0–34.0)
MCHC: 32.5 g/dL (ref 30.0–36.0)
MCV: 98.2 fL (ref 80.0–100.0)
Monocytes Absolute: 0.8 10*3/uL (ref 0.1–1.0)
Monocytes Relative: 7 %
Neutro Abs: 6.3 10*3/uL (ref 1.7–7.7)
Neutrophils Relative %: 55 %
Platelets: 231 10*3/uL (ref 150–400)
RBC: 5.46 MIL/uL (ref 4.22–5.81)
RDW: 13.2 % (ref 11.5–15.5)
WBC: 11.5 10*3/uL — ABNORMAL HIGH (ref 4.0–10.5)
nRBC: 0 % (ref 0.0–0.2)

## 2020-01-11 LAB — BASIC METABOLIC PANEL
Anion gap: 8 (ref 5–15)
BUN: 18 mg/dL (ref 6–20)
CO2: 23 mmol/L (ref 22–32)
Calcium: 9.1 mg/dL (ref 8.9–10.3)
Chloride: 108 mmol/L (ref 98–111)
Creatinine, Ser: 1.24 mg/dL (ref 0.61–1.24)
GFR calc Af Amer: >60 mL/min (ref >60)
GFR calc non Af Amer: >60 mL/min (ref >60)
Glucose, Bld: 104 mg/dL — ABNORMAL HIGH (ref 70–99)
Potassium: 3.5 mmol/L (ref 3.5–5.1)
Sodium: 139 mmol/L (ref 135–145)

## 2020-01-11 LAB — CBG MONITORING, ED: Glucose-Capillary: 122 mg/dL — ABNORMAL HIGH (ref 70–99)

## 2020-01-11 LAB — TROPONIN I (HIGH SENSITIVITY): Troponin I (High Sensitivity): 14 ng/L (ref <18)

## 2020-01-11 MED ORDER — AMLODIPINE BESYLATE 5 MG PO TABS
10.0000 mg | ORAL_TABLET | Freq: Once | ORAL | Status: AC
  Administered 2020-01-11: 10 mg via ORAL
  Filled 2020-01-11: qty 2

## 2020-01-11 MED ORDER — AMLODIPINE BESYLATE 10 MG PO TABS: 10.0000 mg | ORAL_TABLET | Freq: Every day | ORAL | 1 refills | Status: DC

## 2020-01-11 NOTE — ED Provider Notes (Signed)
[facility] EMERGENCY DEPARTMENT Provider Note   CSN: [id number] Arrival date & time: 01/11/20  0505     History Chief Complaint  Patient presents with  . Hypertension    Dorwin Fitzhenry is a 59 y.o. male.  The history is provided by the patient.  Hypertension This is a chronic problem. The problem occurs daily. Pertinent negatives include no chest pain, no abdominal pain, no headaches and no shortness of breath. Nothing aggravates the symptoms. Nothing relieves the symptoms. He has tried nothing for the symptoms. The treatment provided no relief.       Past Medical History:  Diagnosis Date  . Hypertension     There are no problems to display for this patient.   No past surgical history on file.     No family history on file.  Social History   Tobacco Use  . Smoking status: Current Every Day Smoker    Packs/day: 0.50    Years: 25.00    Pack years: 12.50    Types: Cigarettes  . Smokeless tobacco: Never Used  Substance Use Topics  . Alcohol use: No  . Drug use: No    Home Medications Prior to Admission medications   Medication Sig Start Date End Date Taking? Authorizing Provider  amLODipine (NORVASC) 10 MG tablet Take 1 tablet (10 mg total) by mouth daily. 01/11/20 02/10/20  Rex Oesterle, DO  amLODipine-benazepril (LOTREL) 10-20 MG capsule Take 1 capsule by mouth daily. 05/26/16   Loren Racer, MD  benzonatate (TESSALON) 100 MG capsule Take 2 capsules (200 mg total) by mouth every 8 (eight) hours. 10/19/17   Khatri, Hina, PA-C  clindamycin (CLEOCIN) 300 MG capsule Take 1 capsule (300 mg total) by mouth 3 (three) times daily. 06/14/14   Teressa Lower, NP  Diclofenac Sodium CR (VOLTAREN-XR) 100 MG 24 hr tablet Take 1 tablet (100 mg total) by mouth daily. 04/16/16   Palumbo, April, MD  fluticasone (FLONASE) 50 MCG/ACT nasal spray Place 1 spray into both nostrils daily. 10/19/17   Khatri, Hina, PA-C  hydrochlorothiazide (HYDRODIURIL) 25 MG  tablet Take 1 tablet (25 mg total) by mouth daily. 06/07/18   Gerhard Munch, MD  lisinopril (PRINIVIL,ZESTRIL) 10 MG tablet Take 1 tablet (10 mg total) by mouth daily. 06/07/18   Gerhard Munch, MD  meloxicam (MOBIC) 15 MG tablet Take 1 tablet (15 mg total) by mouth daily. 05/16/13   Pollyann Savoy, MD  predniSONE (DELTASONE) 10 MG tablet 6 day step down dose 06/14/14   Teressa Lower, NP    Allergies    Bee venom and Shrimp [shellfish allergy]  Review of Systems   Review of Systems  Constitutional: Negative for chills and fever.  HENT: Negative for ear pain and sore throat.   Eyes: Negative for pain and visual disturbance.  Respiratory: Negative for cough and shortness of breath.   Cardiovascular: Negative for chest pain and palpitations.  Gastrointestinal: Negative for abdominal pain and vomiting.  Genitourinary: Negative for dysuria and hematuria.  Musculoskeletal: Negative for arthralgias and back pain.  Skin: Negative for color change and rash.  Neurological: Negative for seizures, syncope and headaches.  All other systems reviewed and are negative.   Physical Exam Updated Vital Signs BP (!) 209/129 (BP Location: Right Arm)   Pulse 87   Temp 98 F (36.7 C)   Resp 20   Ht 5\' 6"  (1.676 m)   Wt 113.4 kg   SpO2 94%   BMI 40.35 kg/m   Physical Exam Vitals  and nursing note reviewed.  Constitutional:      General: He is not in acute distress.    Appearance: He is well-developed. He is not ill-appearing.  HENT:     Head: Normocephalic and atraumatic.     Nose: Nose normal.     Mouth/Throat:     Mouth: Mucous membranes are moist.  Eyes:     Extraocular Movements: Extraocular movements intact.     Conjunctiva/sclera: Conjunctivae normal.     Pupils: Pupils are equal, round, and reactive to light.  Cardiovascular:     Rate and Rhythm: Normal rate and regular rhythm.     Heart sounds: No murmur heard.   Pulmonary:     Effort: Pulmonary effort is normal. No  respiratory distress.     Breath sounds: Normal breath sounds.  Abdominal:     Palpations: Abdomen is soft.     Tenderness: There is no abdominal tenderness.  Musculoskeletal:     Cervical back: Normal range of motion and neck supple.     Right lower leg: Edema (trace) present.     Left lower leg: Edema (trace) present.  Skin:    General: Skin is warm and dry.  Neurological:     General: No focal deficit present.     Mental Status: He is alert and oriented to person, place, and time.     Cranial Nerves: No cranial nerve deficit.     Sensory: No sensory deficit.     Motor: No weakness.     Coordination: Coordination normal.     ED Results / Procedures / Treatments   Labs (all labs ordered are listed, but only abnormal results are displayed) Labs Reviewed  CBC WITH DIFFERENTIAL/PLATELET - Abnormal; Notable for the following components:      Result Value   WBC 11.5 (*)    Hemoglobin 17.4 (*)    HCT 53.6 (*)    Lymphs Abs 4.2 (*)    All other components within normal limits  BASIC METABOLIC PANEL - Abnormal; Notable for the following components:   Glucose, Bld 104 (*)    All other components within normal limits  CBG MONITORING, ED - Abnormal; Notable for the following components:   Glucose-Capillary 122 (*)    All other components within normal limits  TROPONIN I (HIGH SENSITIVITY)    EKG EKG Interpretation  Date/Time:  Friday January 11 2020 05:46:40 EDT Ventricular Rate:  84 PR Interval:  150 QRS Duration: 88 QT Interval:  374 QTC Calculation: 441 R Axis:   8 Text Interpretation: Normal sinus rhythm Septal infarct , age undetermined Abnormal ECG Confirmed by Virgina Norfolk [phone]) on 01/11/2020 7:02:10 AM   Radiology DG Chest 1 View  Result Date: 01/11/2020 CLINICAL DATA:  Shortness of breath EXAM: CHEST  1 VIEW COMPARISON:  Radiograph 12/02/2009 FINDINGS: Central vascular congestion with hazy basilar and perihilar predominant interstitial opacities as well as some  fissural thickening. No focal consolidative opacity cardiomediastinal contours are unremarkable. No pneumothorax or effusion. No acute osseous or soft tissue abnormality. Degenerative changes are present in the imaged spine and shoulders. IMPRESSION: Findings are most suggestive of vascular congestion and edema. Electronically Signed   By: Kreg Shropshire M.D.   On: 01/11/2020 07:09    Procedures Procedures (including critical care time)  Medications Ordered in ED Medications  amLODipine (NORVASC) tablet 10 mg (has no administration in time range)    ED Course  I have reviewed the triage vital signs and the nursing notes.  Pertinent  labs & imaging results that were available during my care of the patient were reviewed by me and considered in my medical decision making (see chart for details).    MDM Rules/Calculators/A&P                          Rawleigh Rode is a 59 year old male history of hypertension who presents to the ED with high blood pressure.  Patient with blood pressure 209/129 upon arrival but however 160/90 upon my evaluation.  Normal vitals otherwise.  He is asymptomatic.  Does not take his blood pressure medications.  Self medicates now with apple cider vinegar.  Did not like how blood pressure medicine made him feel.  No shortness of breath, no chest pain.  Has had some intermittent swelling in his legs but has very minimal swelling currently.  No orthopnea.  No heart failure symptoms.  Lab work showed no significant anemia, electrolyte abnormality, kidney injury.  Troponin normal.  EKG shows sinus rhythm.  No ischemic changes.  Doubt ACS or other process in the lungs of the heart.  Chest x-ray overall without any signs of pneumonia or pneumothorax.  There is some mild vascular congestion.  Offered to get a BNP and possible more evaluation and possible admission but patient does not want that.  He does not appear to have any severe respiratory symptoms.  And do not believe that he  has any major heart failure symptoms currently.  We will start him back on amlodipine he will follow-up with primary care.  Understands return precautions.  Discharged in good condition.  This chart was dictated using voice recognition software.  Despite best efforts to proofread,  errors can occur which can change the documentation meaning.    Final Clinical Impression(s) / ED Diagnoses Final diagnoses:  Hypertension, unspecified type    Rx / DC Orders ED Discharge Orders         Ordered    amLODipine (NORVASC) 10 MG tablet  Daily     Discontinue     01/11/20 [place], [place], DO 01/11/20 [phone]

## 2020-01-11 NOTE — ED Triage Notes (Addendum)
Per pt he has been having hypertension for a few years.Pt said he took himself off his blood pressures meds and has been doing apple cider vinegar. Pt said headaches and blurred vision tonight. Pt said he lost his insurance so did not tell his dr he took himself off ALL MEDICATIONS

## 2020-04-16 ENCOUNTER — Encounter (HOSPITAL_COMMUNITY): Payer: Self-pay | Admitting: *Deleted

## 2020-04-16 ENCOUNTER — Emergency Department (HOSPITAL_COMMUNITY): Payer: BLUE CROSS/BLUE SHIELD

## 2020-04-16 ENCOUNTER — Other Ambulatory Visit: Payer: Self-pay

## 2020-04-16 ENCOUNTER — Inpatient Hospital Stay (HOSPITAL_COMMUNITY)
Admission: EM | Admit: 2020-04-16 | Discharge: 2020-04-19 | DRG: 682 | Discharge status: Against Medical Advice | Payer: BLUE CROSS/BLUE SHIELD | Attending: Student in an Organized Health Care Education/Training Program | Admitting: Student in an Organized Health Care Education/Training Program

## 2020-04-16 DIAGNOSIS — R000 Tachycardia, unspecified: Secondary | ICD-10-CM | POA: Diagnosis not present

## 2020-04-16 DIAGNOSIS — Z9114 Patient's other noncompliance with medication regimen: Secondary | ICD-10-CM

## 2020-04-16 DIAGNOSIS — G4733 Obstructive sleep apnea (adult) (pediatric): Secondary | ICD-10-CM | POA: Diagnosis present

## 2020-04-16 DIAGNOSIS — R739 Hyperglycemia, unspecified: Secondary | ICD-10-CM | POA: Diagnosis present

## 2020-04-16 DIAGNOSIS — E669 Obesity, unspecified: Secondary | ICD-10-CM | POA: Diagnosis present

## 2020-04-16 DIAGNOSIS — I160 Hypertensive urgency: Secondary | ICD-10-CM | POA: Diagnosis not present

## 2020-04-16 DIAGNOSIS — R748 Abnormal levels of other serum enzymes: Secondary | ICD-10-CM | POA: Diagnosis present

## 2020-04-16 DIAGNOSIS — F17210 Nicotine dependence, cigarettes, uncomplicated: Secondary | ICD-10-CM | POA: Diagnosis present

## 2020-04-16 DIAGNOSIS — R1012 Left upper quadrant pain: Secondary | ICD-10-CM | POA: Diagnosis not present

## 2020-04-16 DIAGNOSIS — R112 Nausea with vomiting, unspecified: Secondary | ICD-10-CM | POA: Diagnosis present

## 2020-04-16 DIAGNOSIS — Z91030 Bee allergy status: Secondary | ICD-10-CM | POA: Diagnosis not present

## 2020-04-16 DIAGNOSIS — K859 Acute pancreatitis without necrosis or infection, unspecified: Secondary | ICD-10-CM | POA: Diagnosis present

## 2020-04-16 DIAGNOSIS — R109 Unspecified abdominal pain: Secondary | ICD-10-CM | POA: Insufficient documentation

## 2020-04-16 DIAGNOSIS — N189 Chronic kidney disease, unspecified: Secondary | ICD-10-CM | POA: Diagnosis not present

## 2020-04-16 DIAGNOSIS — R0682 Tachypnea, not elsewhere classified: Secondary | ICD-10-CM | POA: Diagnosis not present

## 2020-04-16 DIAGNOSIS — Z20822 Contact with and (suspected) exposure to covid-19: Secondary | ICD-10-CM | POA: Diagnosis present

## 2020-04-16 DIAGNOSIS — T464X5A Adverse effect of angiotensin-converting-enzyme inhibitors, initial encounter: Secondary | ICD-10-CM | POA: Diagnosis present

## 2020-04-16 DIAGNOSIS — R61 Generalized hyperhidrosis: Secondary | ICD-10-CM | POA: Diagnosis present

## 2020-04-16 DIAGNOSIS — E872 Acidosis: Secondary | ICD-10-CM | POA: Diagnosis present

## 2020-04-16 DIAGNOSIS — F1410 Cocaine abuse, uncomplicated: Secondary | ICD-10-CM | POA: Diagnosis present

## 2020-04-16 DIAGNOSIS — Z8249 Family history of ischemic heart disease and other diseases of the circulatory system: Secondary | ICD-10-CM

## 2020-04-16 DIAGNOSIS — I129 Hypertensive chronic kidney disease with stage 1 through stage 4 chronic kidney disease, or unspecified chronic kidney disease: Secondary | ICD-10-CM | POA: Diagnosis present

## 2020-04-16 DIAGNOSIS — I10 Essential (primary) hypertension: Secondary | ICD-10-CM | POA: Diagnosis not present

## 2020-04-16 DIAGNOSIS — E662 Morbid (severe) obesity with alveolar hypoventilation: Secondary | ICD-10-CM | POA: Diagnosis not present

## 2020-04-16 DIAGNOSIS — R17 Unspecified jaundice: Secondary | ICD-10-CM

## 2020-04-16 DIAGNOSIS — E875 Hyperkalemia: Secondary | ICD-10-CM | POA: Diagnosis present

## 2020-04-16 DIAGNOSIS — Z720 Tobacco use: Secondary | ICD-10-CM | POA: Diagnosis not present

## 2020-04-16 DIAGNOSIS — Z79899 Other long term (current) drug therapy: Secondary | ICD-10-CM | POA: Diagnosis not present

## 2020-04-16 DIAGNOSIS — N179 Acute kidney failure, unspecified: Secondary | ICD-10-CM | POA: Diagnosis not present

## 2020-04-16 DIAGNOSIS — F1490 Cocaine use, unspecified, uncomplicated: Secondary | ICD-10-CM | POA: Diagnosis not present

## 2020-04-16 DIAGNOSIS — K5900 Constipation, unspecified: Secondary | ICD-10-CM | POA: Diagnosis present

## 2020-04-16 DIAGNOSIS — N170 Acute kidney failure with tubular necrosis: Secondary | ICD-10-CM | POA: Diagnosis not present

## 2020-04-16 DIAGNOSIS — I161 Hypertensive emergency: Secondary | ICD-10-CM | POA: Diagnosis present

## 2020-04-16 DIAGNOSIS — N182 Chronic kidney disease, stage 2 (mild): Secondary | ICD-10-CM | POA: Diagnosis present

## 2020-04-16 DIAGNOSIS — Z5329 Procedure and treatment not carried out because of patient's decision for other reasons: Secondary | ICD-10-CM | POA: Diagnosis not present

## 2020-04-16 DIAGNOSIS — K8500 Idiopathic acute pancreatitis without necrosis or infection: Secondary | ICD-10-CM | POA: Diagnosis not present

## 2020-04-16 DIAGNOSIS — Z6841 Body Mass Index (BMI) 40.0 and over, adult: Secondary | ICD-10-CM

## 2020-04-16 DIAGNOSIS — R1011 Right upper quadrant pain: Secondary | ICD-10-CM | POA: Diagnosis not present

## 2020-04-16 DIAGNOSIS — Z91013 Allergy to seafood: Secondary | ICD-10-CM

## 2020-04-16 HISTORY — DX: Unspecified abdominal pain: R10.9

## 2020-04-16 LAB — URINALYSIS, ROUTINE W REFLEX MICROSCOPIC
Bacteria, UA: NONE SEEN
Bilirubin Urine: NEGATIVE
Glucose, UA: NEGATIVE mg/dL
Ketones, ur: NEGATIVE mg/dL
Leukocytes,Ua: NEGATIVE
Nitrite: NEGATIVE
Protein, ur: NEGATIVE mg/dL
Specific Gravity, Urine: 1.016 (ref 1.005–1.030)
pH: 6 (ref 5.0–8.0)

## 2020-04-16 LAB — COMPREHENSIVE METABOLIC PANEL
ALT: 29 U/L (ref 0–44)
AST: 24 U/L (ref 15–41)
Albumin: 3.6 g/dL (ref 3.5–5.0)
Alkaline Phosphatase: 70 U/L (ref 38–126)
Anion gap: 10 (ref 5–15)
BUN: 18 mg/dL (ref 6–20)
CO2: 25 mmol/L (ref 22–32)
Calcium: 9.3 mg/dL (ref 8.9–10.3)
Chloride: 103 mmol/L (ref 98–111)
Creatinine, Ser: 1.41 mg/dL — ABNORMAL HIGH (ref 0.61–1.24)
GFR calc Af Amer: >60 mL/min (ref >60)
GFR calc non Af Amer: 55 mL/min — ABNORMAL LOW (ref >60)
Glucose, Bld: 112 mg/dL — ABNORMAL HIGH (ref 70–99)
Potassium: 3.9 mmol/L (ref 3.5–5.1)
Sodium: 138 mmol/L (ref 135–145)
Total Bilirubin: 0.7 mg/dL (ref 0.3–1.2)
Total Protein: 7.5 g/dL (ref 6.5–8.1)

## 2020-04-16 LAB — TROPONIN I (HIGH SENSITIVITY)
Troponin I (High Sensitivity): 13 ng/L (ref <18)
Troponin I (High Sensitivity): 16 ng/L (ref <18)

## 2020-04-16 LAB — CBC
HCT: 51.7 % (ref 39.0–52.0)
Hemoglobin: 16.7 g/dL (ref 13.0–17.0)
MCH: 31.2 pg (ref 26.0–34.0)
MCHC: 32.3 g/dL (ref 30.0–36.0)
MCV: 96.6 fL (ref 80.0–100.0)
Platelets: 239 10*3/uL (ref 150–400)
RBC: 5.35 MIL/uL (ref 4.22–5.81)
RDW: 13.2 % (ref 11.5–15.5)
WBC: 9 10*3/uL (ref 4.0–10.5)
nRBC: 0 % (ref 0.0–0.2)

## 2020-04-16 LAB — LIPASE, BLOOD: Lipase: 33 U/L (ref 11–51)

## 2020-04-16 LAB — LACTIC ACID, PLASMA: Lactic Acid, Venous: 3 mmol/L (ref 0.5–1.9)

## 2020-04-16 LAB — TRIGLYCERIDES: Triglycerides: 81 mg/dL (ref <150)

## 2020-04-16 LAB — SARS CORONAVIRUS 2 BY RT PCR (HOSPITAL ORDER, PERFORMED IN ~~LOC~~ HOSPITAL LAB): SARS Coronavirus 2: NEGATIVE

## 2020-04-16 MED ORDER — SODIUM CHLORIDE 0.9 % IV SOLN: Freq: Once | INTRAVENOUS | Status: AC

## 2020-04-16 MED ORDER — ONDANSETRON HCL 4 MG/2ML IJ SOLN
4.0000 mg | Freq: Once | INTRAMUSCULAR | Status: AC
  Administered 2020-04-16: 4 mg via INTRAVENOUS
  Filled 2020-04-16: qty 2

## 2020-04-16 MED ORDER — HYDRALAZINE HCL 20 MG/ML IJ SOLN
10.0000 mg | INTRAMUSCULAR | Status: DC | PRN
  Administered 2020-04-16: 10 mg via INTRAVENOUS
  Filled 2020-04-16: qty 1

## 2020-04-16 MED ORDER — METOPROLOL TARTRATE 5 MG/5ML IV SOLN
10.0000 mg | INTRAVENOUS | Status: DC | PRN
  Administered 2020-04-17 (×2): 10 mg via INTRAVENOUS

## 2020-04-16 MED ORDER — BISACODYL 5 MG PO TBEC
10.0000 mg | DELAYED_RELEASE_TABLET | Freq: Once | ORAL | Status: AC
  Administered 2020-04-16: 10 mg via ORAL
  Filled 2020-04-16: qty 2

## 2020-04-16 MED ORDER — NITROGLYCERIN IN D5W 200-5 MCG/ML-% IV SOLN: 0.0000 ug/min | INTRAVENOUS | Status: DC

## 2020-04-16 MED ORDER — METOPROLOL TARTRATE 5 MG/5ML IV SOLN
10.0000 mg | INTRAVENOUS | Status: AC | PRN
  Administered 2020-04-16 (×3): 10 mg via INTRAVENOUS
  Filled 2020-04-16 (×4): qty 10

## 2020-04-16 MED ORDER — ONDANSETRON HCL 4 MG PO TABS
4.0000 mg | ORAL_TABLET | Freq: Four times a day (QID) | ORAL | Status: DC | PRN
  Filled 2020-04-16: qty 1

## 2020-04-16 MED ORDER — INSULIN ASPART 100 UNIT/ML ~~LOC~~ SOLN
0.0000 [IU] | Freq: Three times a day (TID) | SUBCUTANEOUS | Status: DC
  Administered 2020-04-17: 3 [IU] via SUBCUTANEOUS

## 2020-04-16 MED ORDER — HYDRALAZINE HCL 20 MG/ML IJ SOLN
10.0000 mg | INTRAMUSCULAR | Status: DC | PRN
  Administered 2020-04-17: 10 mg via INTRAVENOUS
  Filled 2020-04-16: qty 1

## 2020-04-16 MED ORDER — ONDANSETRON HCL 4 MG/2ML IJ SOLN
4.0000 mg | Freq: Four times a day (QID) | INTRAMUSCULAR | Status: DC | PRN
  Administered 2020-04-17: 4 mg via INTRAVENOUS
  Filled 2020-04-16: qty 2

## 2020-04-16 MED ORDER — HYDROMORPHONE HCL 1 MG/ML IJ SOLN
2.0000 mg | Freq: Once | INTRAMUSCULAR | Status: AC
  Administered 2020-04-16: 2 mg via INTRAVENOUS
  Filled 2020-04-16: qty 2

## 2020-04-16 MED ORDER — ENOXAPARIN SODIUM 40 MG/0.4ML ~~LOC~~ SOLN
40.0000 mg | Freq: Every day | SUBCUTANEOUS | Status: DC
  Administered 2020-04-16 – 2020-04-17 (×2): 40 mg via SUBCUTANEOUS
  Filled 2020-04-16 (×2): qty 0.4

## 2020-04-16 MED ORDER — FENTANYL CITRATE (PF) 100 MCG/2ML IJ SOLN
50.0000 ug | Freq: Once | INTRAMUSCULAR | Status: AC
  Administered 2020-04-16: 50 ug via INTRAVENOUS
  Filled 2020-04-16: qty 2

## 2020-04-16 MED ORDER — LABETALOL HCL 5 MG/ML IV SOLN
10.0000 mg | Freq: Once | INTRAVENOUS | Status: AC
  Administered 2020-04-16: 10 mg via INTRAVENOUS
  Filled 2020-04-16: qty 4

## 2020-04-16 MED ORDER — IOHEXOL 350 MG/ML SOLN
100.0000 mL | Freq: Once | INTRAVENOUS | Status: AC | PRN
  Administered 2020-04-16: 100 mL via INTRAVENOUS

## 2020-04-16 MED ORDER — NICOTINE 21 MG/24HR TD PT24
21.0000 mg | MEDICATED_PATCH | Freq: Every day | TRANSDERMAL | Status: DC
  Administered 2020-04-17 – 2020-04-19 (×3): 21 mg via TRANSDERMAL
  Filled 2020-04-16 (×3): qty 1

## 2020-04-16 MED ORDER — HYDROMORPHONE HCL 1 MG/ML IJ SOLN
0.5000 mg | INTRAMUSCULAR | Status: DC | PRN
  Administered 2020-04-17 (×5): 1 mg via INTRAVENOUS
  Filled 2020-04-16 (×5): qty 1

## 2020-04-16 MED ORDER — HYDRALAZINE HCL 20 MG/ML IJ SOLN
10.0000 mg | Freq: Once | INTRAMUSCULAR | Status: AC
  Administered 2020-04-16: 10 mg via INTRAVENOUS
  Filled 2020-04-16: qty 1

## 2020-04-16 MED ORDER — HYDROMORPHONE HCL 1 MG/ML IJ SOLN
1.0000 mg | Freq: Once | INTRAMUSCULAR | Status: AC
  Administered 2020-04-16: 1 mg via INTRAVENOUS
  Filled 2020-04-16: qty 1

## 2020-04-16 MED ORDER — HYDROMORPHONE HCL 1 MG/ML IJ SOLN: 1.0000 mg | Freq: Once | INTRAMUSCULAR | Status: DC

## 2020-04-16 NOTE — H&P (Addendum)
Date: 04/17/2020               Patient Name:  Ricky Boyle MRN: [id number]  DOB: 1961/01/17 Age / Sex: 59 y.o., male   PCP: Patient, No Pcp Per         Medical Service: Internal Medicine Teaching Service         Attending Physician: Dr. Sandre Kitty, Elwin Mocha, MD    First Contact: Dr. Laddie Aquas Pager: [phone]  Second Contact: Dr. Maryla Morrow Pager: [phone]       After Hours (After 5p/  First Contact Pager: [phone]  weekends / holidays): Second Contact Pager: [phone]   Chief Complaint: abdominal pain  History of Present Illness:  Ricky Boyle is a 59 year old male with past medical history of hypertension, tobacco use disorder, obesity, and obstructive sleep apnea presenting with abdominal pain for two days duration. Patient endorses epigastric abdominal pain with radiation to bilateral upper quadrants for two days duration that has been constant in nature. He notes that pain initially started when he was driving two days ago; 30 minutes after eating air fried pork chops. He notes that since then, he has had constant abdominal pain that is dull in nature. He denies any alleviating factors but does endorse that abdominal pain is worse after straining to have a bowel movement. He endorses constipation for two days without obstipation. He also endorses ongoing nausea with two episodes of biliary emesis prior to admission. He denies any similar episodes previously. He denies any fevers/chills, headache, vision changes, chest pain, shortness of breath, back pain, urinary changes, or any focal deficits. Of note, patient has a history of hypertension for which he has been evaluated in the ED multiple times and has been prescribed amlodipine, HCTZ, and amlodipine-benazepiril on various occasions. Patient notes that he was most recently taking amlodipine but discontinued this two weeks prior to admission due to lower extremity edema.   ED Course: Patient presented with  Mid abdominal pain  associated with constipation and nausea with emesis and noted to be significantly diaphoretic with SBP>200. CTA Abd/Pelvis negative for acute aortic dissection. Noted to have acute pancreatitis on imaging. Persistent hypertension despite labetalol and hydralazine. Patient admitted to IMTS for further evaluation.   Meds:  Current Meds  Medication Sig  . amLODipine (NORVASC) 10 MG tablet Take 1 tablet (10 mg total) by mouth daily.   Allergies: Allergies as of 04/16/2020 - Review Complete 04/16/2020  Allergen Reaction Noted  . Bee venom Anaphylaxis 09/22/2013  . Shrimp [shellfish allergy] Anaphylaxis 09/22/2013   Past Medical History:  Diagnosis Date  . Hypertension     Family History:  Brother - heart disease, prior MI at age 32  Social History:  Patient endorses smoking 2ppd for over 30 years. 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 He is unable to relay when his last use was.   Review of Systems: A complete ROS was negative except as per HPI.   Physical Exam: Blood pressure (!) 204/140, pulse 96, temperature 98.7 F (37.1 C), temperature source Oral, resp. rate 18, SpO2 99 %. Physical Exam  Constitutional: Appears well developed and well nourished. Appears to be in acute distress; diaphoretic HENT: Normocephalic and atraumatic, EOMI, conjunctiva normal, moist mucous membranes Cardiovascular: Normal rate, regular rhythm, S1 and S2 present, no murmurs, rubs, gallops.  Distal pulses intact Respiratory: No respiratory distress, no accessory muscle use.  Effort is normal.  Lungs are clear to auscultation bilaterally.  GI: Obese abdomen, nondistended, soft; +bowel sounds; tenderness to palpation in epigastric and LUQ area without rebound tenderness or guarding  Musculoskeletal: Normal bulk and tone.  No peripheral edema noted. Neurological: Is alert and oriented x4, no apparent focal deficits noted. Skin: Warm and diaphoretic.  No rash, erythema, lesions  noted. Psychiatric: Normal mood and affect. Behavior is normal. Judgment and thought content normal.   EKG: personally reviewed my interpretation is left axis deviation; no acute ST-segment changes noted; QTc   CBC    Component Value Date/Time   WBC 9.0 04/16/2020 1105   RBC 5.35 04/16/2020 1105   HGB 16.7 04/16/2020 1105   HCT 51.7 04/16/2020 1105   PLT 239 04/16/2020 1105   MCV 96.6 04/16/2020 1105   MCH 31.2 04/16/2020 1105   MCHC 32.3 04/16/2020 1105   RDW 13.2 04/16/2020 1105   LYMPHSABS 4.2 (H) 01/11/2020 0539   MONOABS 0.8 01/11/2020 0539   EOSABS 0.2 01/11/2020 0539   BASOSABS 0.1 01/11/2020 0539   BMP Latest Ref Rng & Units 04/16/2020 01/11/2020 06/07/2018  Glucose 70 - 99 mg/dL 354(S) 568(L) 99  BUN 6 - 20 mg/dL 18 18 27(N)  Creatinine 0.61 - 1.24 mg/dL 1.70(Y) 1.74 9.44(H)  Sodium 135 - 145 mmol/L 138 139 137  Potassium 3.5 - 5.1 mmol/L 3.9 3.5 3.9  Chloride 98 - 111 mmol/L 103 108 103  CO2 22 - 32 mmol/L 25 23 27   Calcium 8.9 - 10.3 mg/dL 9.3 9.1 9.0   Lipase     Component Value Date/Time   LIPASE 33 04/16/2020 1105   Amylase    Component Value Date/Time   AMYLASE 590 (H) 04/16/2020 2130    CT HEAD WO CONTRAST: IMPRESSION: Unremarkable non-contrast CT appearance of the brain. No evidence of acute intracranial abnormality. Mild paranasal sinus mucosal thickening  CT ANGIO CHEST/ABD/PELVIS:  IMPRESSION: 1. No evidence of acute aortic syndrome. 2. Findings suggest an acute interstitial edematous pancreatitis with peripancreatic fluid and phlegmon centered upon the pancreatic body and tail. No focal hypoattenuation or evidence of pancreatic necrosis. 3. No visible calcified gallstones. No pancreatic ductal dilatation. 4. Mild ventral diastasis recti with superimposed fat containing umbilical hernia. No bowel containing hernias. 5. Atelectatic changes in the lungs without other acute intrathoracic process. 6. Aortic Atherosclerosis (ICD10-I70.0) 7.  Emphysema (ICD10-J43.9).  RUQ 2131: IMPRESSION: 1. Slightly limited but unremarkable sonographic appearance of the gallbladder and liver.  Assessment & Plan by Problem: Active Problems:   Acute pancreatitis   Asymptomatic hypertensive urgency  Ricky Boyle is a 59 year old male with PMHx of hypertension, obesity and obstructive sleep apnea presenting with 2 days of severe epigastric abdominal pain admitted for acute pancreatitis and severe asymptomatic hypertension.  Abdominal pain 2/2 acute pancreatitis Patient presented with 2 days of severe epigastric abdominal pain associated with nausea and vomiting.  Noted to have epigastric pain associated with food and worsened with Valsalva maneuver.  Associated with constipation but no obstipation.  Labs including leukocytosis and lipase are within normal limits. But does have elevated amylase. Findings on CT abdomen suggest acute interstitial edematous pancreatitis with peripancreatic fluid and phlegmon centered on the pancreatic body and tail.  No evidence of pancreatic necrosis at this time.  Patient meets criteria for acute pancreatitis. Ppatient denies alcohol use, triglycerides and calcium within normal limits, no recent abdominal procedures including ERCP.  He does not report any recent medications that would cause acute pancreatitis.  At this time, etiology of his acute pancreatitis is unclear.  Differential also includes peptic ulcer disease, gastritis, gastroenteritis. -Pain control with IV Dilaudid -Currently n.p.o., advance diet as tolerated -Gentle IV hydration for lactic acidosis  Severe asymptomatic hypertension Patient has a history of poorly controlled hypertension.  He was previously on amlodipine, lisinopril, HCTZ at various times throughout the past 3 years.  Patient notes that he was only taking his amlodipine which she discontinued 2 weeks ago due to bilateral lower extremity edema.  Systolic blood pressure greater than 220 on  presentation.  Patient received multiple doses of IV hydralazine, labetalol, metoprolol.  With improvement as low as SBP 200.  Suspect that his blood pressure has been chronically elevated.  He is currently asymptomatic. Patient does have a history of cocaine use and given his diaphoresis on examination, poor response to multiple doses of IV medication, will also obtain urine toxicology. Troponins x2 are negative. Goal is to decrease SBP to <180 and diastolic <110 over the next 24-48 hours with a 25-30% decrease in MAP (goal 119).  - IV metoprolol 10mg  and IV hydralazine prn - Restart amlodipine 10mg  daily and will add losartan 50mg  daily - Continue to monitor vitals and for any signs of organ hypoperfusion  - F/u rapid UDS  AKI vs CKD Noted to have sCr 1.41 on presentation compared to 1.24 in June 2021 which seems to be around his baseline of 1.2-1.3. Suspect he has progression of CKD in setting of uncontrolled hypertension. Continues to have good urine output. Will hold on IVF at this time given severe hypertension.  - BMP daily - Monitor I&O - Avoid nephrotoxic agents   Obstructive sleep apnea Patient with history of OSA, prior records from PCP visit in 2017 indicate that patient was being worked up with sleep study but he was unable to get this. He is not currently on CPAP. - CPAP over night - Continue to monitor  Diet: NPO, advance diet as tolerated  DVT Prophylaxis: Lovenox Code status: FULL    Dispo: Admit patient to Inpatient with expected length of stay greater than 2 midnights.  Signed , MD 04/17/2020, 12:13 AM  Pager: [phone] After 5pm on weekdays and 1pm on weekends: On Call pager: [phone]

## 2020-04-16 NOTE — ED Provider Notes (Signed)
[facility] Provider Note   CSN: [id number] Arrival date & time: 04/16/20  1031     History Chief Complaint  Patient presents with  . Hypertension  . Abdominal Pain    Ricky Boyle is a 59 y.o. male with past medical history of hypertension, presenting to the emergency department with complaint of 2 days of worsening mid abdominal pain with associated constipation and nausea.  He had an episode of vomiting today.  He states pain does not radiate.  He has no associated chest pain or shortness of breath, numbness or weakness, vision changes.  He was brought to an acute bed after falling to the floor in the waiting room.  He is found to be significantly diaphoretic.  He states he has not been compliant with his blood pressure medication for the last 2 weeks, he takes lisinopril.  He has not had symptoms like this before.  He also complained of headache in triage though has no similar complaints on evaluation.  Denies alcohol use.  Denies known history of diabetes.    The history is provided by the patient.       Past Medical History:  Diagnosis Date  . Hypertension     There are no problems to display for this patient.   History reviewed. No pertinent surgical history.     History reviewed. No pertinent family history.  Social History   Tobacco Use  . Smoking status: Current Every Day Smoker    Packs/day: 0.50    Years: 25.00    Pack years: 12.50    Types: Cigarettes  . Smokeless tobacco: Never Used  Substance Use Topics  . Alcohol use: No  . Drug use: No    Home Medications Prior to Admission medications   Medication Sig Start Date End Date Taking? Authorizing Provider  amLODipine (NORVASC) 10 MG tablet Take 1 tablet (10 mg total) by mouth daily. 01/11/20 04/16/20 Yes Curatolo, Adam, DO  amLODipine-benazepril (LOTREL) 10-20 MG capsule Take 1 capsule by mouth daily. Patient not taking: Reported on 04/16/2020 05/26/16    Loren RacerYelverton, David, MD  hydrochlorothiazide (HYDRODIURIL) 25 MG tablet Take 1 tablet (25 mg total) by mouth daily. Patient not taking: Reported on 04/16/2020 06/07/18   Gerhard MunchLockwood, Robert, MD  lisinopril (PRINIVIL,ZESTRIL) 10 MG tablet Take 1 tablet (10 mg total) by mouth daily. Patient not taking: Reported on 04/16/2020 06/07/18   Gerhard MunchLockwood, Robert, MD    Allergies    Bee venom and Shrimp [shellfish allergy]  Review of Systems   Review of Systems  Respiratory: Negative for shortness of breath.   Cardiovascular: Negative for chest pain.  Gastrointestinal: Positive for abdominal pain, constipation, nausea and vomiting.  All other systems reviewed and are negative.   Physical Exam Updated Vital Signs BP (!) 220/139   Pulse (!) 105   Temp 98.7 F (37.1 C) (Oral)   Resp 20   SpO2 91%   Physical Exam Vitals and nursing note reviewed.  Constitutional:      General: He is in acute distress.     Appearance: He is well-developed. He is ill-appearing and diaphoretic (Patient is significantly diaphoretic).  HENT:     Head: Normocephalic and atraumatic.  Eyes:     Conjunctiva/sclera: Conjunctivae normal.  Cardiovascular:     Rate and Rhythm: Normal rate and regular rhythm.  Pulmonary:     Effort: Pulmonary effort is normal. No respiratory distress.     Breath sounds: Normal breath sounds.  Abdominal:  General: Bowel sounds are normal.     Palpations: Abdomen is soft.     Tenderness: There is abdominal tenderness in the epigastric area, periumbilical area and left lower quadrant. There is no guarding.  Skin:    General: Skin is warm.  Neurological:     Mental Status: He is alert.  Psychiatric:        Behavior: Behavior normal.     ED Results / Procedures / Treatments   Labs (all labs ordered are listed, but only abnormal results are displayed) Labs Reviewed  COMPREHENSIVE METABOLIC PANEL - Abnormal; Notable for the following components:      Result Value   Glucose, Bld 112  (*)    Creatinine, Ser 1.41 (*)    GFR calc non Af Amer 55 (*)    All other components within normal limits  URINALYSIS, ROUTINE W REFLEX MICROSCOPIC - Abnormal; Notable for the following components:   Color, Urine STRAW (*)    Hgb urine dipstick SMALL (*)    All other components within normal limits  LACTIC ACID, PLASMA - Abnormal; Notable for the following components:   Lactic Acid, Venous 3.0 (*)    All other components within normal limits  SARS CORONAVIRUS 2 BY RT PCR (HOSPITAL ORDER, PERFORMED IN [place] [facility])  LIPASE, BLOOD  CBC  TRIGLYCERIDES  TROPONIN I (HIGH SENSITIVITY)  TROPONIN I (HIGH SENSITIVITY)    EKG EKG Interpretation  Date/Time:  Wednesday April 16 2020 11:00:18 EDT Ventricular Rate:  83 PR Interval:  148 QRS Duration: 92 QT Interval:  390 QTC Calculation: 458 R Axis:   -31 Text Interpretation: Normal sinus rhythm Left axis deviation Septal infarct , age undetermined Abnormal ECG Confirmed by Gerhard Munch [phone]) on 04/16/2020 5:02:49 PM   Radiology CT Head Wo Contrast  Result Date: 04/16/2020 CLINICAL DATA:  Headache, intracranial hemorrhage suspected. Additional provided: Bilateral frontal headache, hypertension. EXAM: CT HEAD WITHOUT CONTRAST TECHNIQUE: Contiguous axial images were obtained from the base of the skull through the vertex without intravenous contrast. COMPARISON:  Report from head CT 04/14/2014 (images unavailable). FINDINGS: Brain: Cerebral volume is normal for age. There is no acute intracranial hemorrhage. No demarcated cortical infarct. No extra-axial fluid collection. No evidence of intracranial mass. No midline shift. Vascular: No hyperdense vessel.  Atherosclerotic calcifications Skull: Normal. Negative for fracture or focal lesion. Sinuses/Orbits: Visualized orbits show no acute finding. Mild paranasal sinus mucosal thickening at the imaged levels, most notably ethmoidal. No significant mastoid effusion at the imaged  levels. IMPRESSION: Unremarkable non-contrast CT appearance of the brain. No evidence of acute intracranial abnormality. Mild paranasal sinus mucosal thickening. Electronically Signed   By: Jackey Loge DO   On: 04/16/2020 14:15   CT Angio Chest/Abd/Pel for Dissection W and/or W/WO  Result Date: 04/16/2020 CLINICAL DATA:  Abdominal pain, aortic dissection suspected EXAM: CT ANGIOGRAPHY CHEST, ABDOMEN AND PELVIS TECHNIQUE: Non-contrast CT of the chest was initially obtained. Multidetector CT imaging through the chest, abdomen and pelvis was performed using the standard protocol during bolus administration of intravenous contrast. Multiplanar reconstructed images and MIPs were obtained and reviewed to evaluate the vascular anatomy. CONTRAST:  OMNIPAQUE IOHEXOL 350 MG/ML SOLN COMPARISON:  Radiograph 01/11/2020, CT abdomen pelvis 07/07/2009 FINDINGS: CTA CHEST FINDINGS Cardiovascular: Noncontrast CT of the chest was performed initially. This reveals a normal caliber thoracic aorta without hyperdense mural thickening suggest intramural hematoma. Postcontrast administration there is satisfactory opacification of the aorta. The aortic root is suboptimally assessed given cardiac pulsation artifact. No  acute luminal abnormality of the imaged aorta. No periaortic stranding or hemorrhage. Shared origin of the brachiocephalic and left common carotid arteries. Mild tortuosity of brachiocephalic vessels. Proximal great vessels are otherwise unremarkable. Central pulmonary arteries are normal caliber. No large central, lobar proximal segmental filling defects on this non tailored examination of the pulmonary arteries. Normal heart size. No pericardial effusion. Mediastinum/Nodes: No mediastinal fluid or gas. Normal thyroid gland and thoracic inlet. No acute abnormality of the trachea or esophagus. No worrisome mediastinal, hilar or axillary adenopathy. Lungs/Pleura: Paraseptal emphysematous changes are present within  apical predominance. Some atelectatic changes in low volumes are present likely accentuated by imaging during exhalation for the angiographic technique. No consolidation, features of edema, pneumothorax, or effusion. No concerning pulmonary nodules or masses. Musculoskeletal: Mild degenerative changes present in the thoracic spine. No concerning chest wall lesions. Review of the MIP images confirms the above findings. CTA ABDOMEN AND PELVIS FINDINGS VASCULAR Evaluation limited by suboptimal opacification and image noise secondary to body habitus. Aorta: Scattered calcified and noncalcified atheromatous plaque within the abdominal aorta. No significant stenosis. Normal caliber aorta without aneurysm, dissection, or features of vasculitis/aortitis. Celiac: Patent ostium. Normal opacification and branching pattern. No evidence of aneurysm, dissection or vasculitis. SMA: Patent without evidence of aneurysm, dissection, vasculitis or significant stenosis. Renals: Single renal arteries bilaterally. Both are widely patent with normal opacification. No aneurysm, dissection, vasculitis or features of fibromuscular dysplasia. IMA: Grossly patent with normal opacification. No acute luminal abnormality or features of vasculitis. Inflow: Minimal plaque within the common and internal iliac segments bilaterally. No significant stenosis. No acute luminal abnormality. No aneurysm or ectasia. Minimal plaque in the proximal outflow vasculature, specifically the common and superficial femoral arteries bilaterally without thickened significant flow-limiting stenosis or acute luminal abnormality. Veins: No obvious venous abnormality within the limitations of this arterial phase study. Review of the MIP images confirms the above findings. NON-VASCULAR Hepatobiliary: No worrisome focal liver lesions. Smooth liver surface contour. Normal hepatic attenuation. Normal gallbladder and biliary tree. No visible calcified gallstones. Pancreas:  Diffusely edematous appearance of the pancreas with peripancreatic fluid and phlegmon centered upon the pancreatic body and tail. Relative sparing of the head and uncinate. No focal hypoattenuation or evidence of pancreatic necrosis. No pancreatic ductal dilatation. Spleen: Normal in size. No concerning splenic lesions. Adrenals/Urinary Tract: Normal adrenal glands. Kidneys are normally located with symmetric enhancement. Subcentimeter hypoattenuating focus in the posterior interpolar right kidney (6/167) too small to fully characterize on CT imaging but statistically likely benign. No suspicious renal lesion, urolithiasis or hydronephrosis. Urinary bladder is largely decompressed at the time of exam and therefore poorly evaluated by CT imaging. No gross bladder abnormality. Stomach/Bowel: Distal esophagus, stomach and duodenal sweep are unremarkable. No small bowel wall thickening or dilatation. No evidence of obstruction. A normal appendix is visualized. No colonic dilatation or wall thickening. Lymphatic: No suspicious or enlarged lymph nodes in the included lymphatic chains. Reproductive: Coarse eccentric calcification of the prostate. No concerning abnormalities of the prostate or seminal vesicles. Other: Free fluid centered about the pancreas and in the left anterior pararenal space, likely related to the pancreatic process. No other abdominopelvic free air or fluid. Mild ventral diastasis recti of with superimposed fat containing umbilical hernia. No bowel containing hernias. Minimal body wall edema. Musculoskeletal: No acute osseous abnormality or suspicious osseous lesion. Mild multilevel degenerative changes are present in the imaged portions of the lumbar spine. Mild degenerative changes in the hips and pelvis as well. Review of the MIP images  confirms the above findings. IMPRESSION: 1. No evidence of acute aortic syndrome. 2. Findings suggest an acute interstitial edematous pancreatitis with  peripancreatic fluid and phlegmon centered upon the pancreatic body and tail. No focal hypoattenuation or evidence of pancreatic necrosis. 3. No visible calcified gallstones. No pancreatic ductal dilatation. 4. Mild ventral diastasis recti with superimposed fat containing umbilical hernia. No bowel containing hernias. 5. Atelectatic changes in the lungs without other acute intrathoracic process. 6. Aortic Atherosclerosis (ICD10-I70.0) 7. Emphysema (ICD10-J43.9). Electronically Signed   By: Kreg Shropshire M.D.   On: 04/16/2020 17:08   US Abdomen Limited RUQ  Result Date: 04/16/2020 CLINICAL DATA:  Pancreatitis EXAM: ULTRASOUND ABDOMEN LIMITED RIGHT UPPER QUADRANT COMPARISON:  CT 04/16/2020 FINDINGS: Gallbladder: No gallstones or wall thickening visualized. No sonographic Murphy sign noted by sonographer. Common bile duct: Diameter: 5 mm proximally Liver: Evaluation of the liver is slightly limited by overlying osseous structures with limited visualization of the hepatic dome. No focal lesion identified. Within normal limits in parenchymal echogenicity. Portal vein is patent on color Doppler imaging with normal direction of blood flow towards the liver. Other: The visualized mid body of the pancreas appears thickened and there is trace peripancreatic fluid seen anteriorly in keeping with changes of acute pancreatitis. IMPRESSION: 1. Slightly limited but unremarkable sonographic appearance of the gallbladder and liver. Electronically Signed   By: Helyn Numbers MD   On: 04/16/2020 18:48    Procedures .Critical Care Performed by: Lilly Gasser, [place], PA-C Authorized by: Jenasia Dolinar, [place] N, PA-C   Critical care provider statement:    Critical care time (minutes):  45   Critical care was necessary to treat or prevent imminent or life-threatening deterioration of the following conditions: hypertensive emergency on nitro drip.   Critical care was time spent personally by me on the following activities:   Discussions with consultants, evaluation of patient's response to treatment, examination of patient, ordering and performing treatments and interventions, ordering and review of laboratory studies, ordering and review of radiographic studies, pulse oximetry, re-evaluation of patient's condition, obtaining history from patient or surrogate and review of old charts   (including critical care time)  Medications Ordered in ED Medications  0.9 %  sodium chloride infusion (has no administration in time range)  HYDROmorphone (DILAUDID) injection 2 mg (has no administration in time range)  nitroGLYCERIN 50 mg in dextrose 5 % 250 mL (0.2 mg/mL) infusion (has no administration in time range)  fentaNYL (SUBLIMAZE) injection 50 mcg (50 mcg Intravenous Given 04/16/20 1625)  ondansetron (ZOFRAN) injection 4 mg (4 mg Intravenous Given 04/16/20 1625)  iohexol (OMNIPAQUE) 350 MG/ML injection 100 mL (100 mLs Intravenous Contrast Given 04/16/20 1643)  HYDROmorphone (DILAUDID) injection 1 mg (1 mg Intravenous Given 04/16/20 1708)  labetalol (NORMODYNE) injection 10 mg (10 mg Intravenous Given 04/16/20 1748)  hydrALAZINE (APRESOLINE) injection 10 mg (10 mg Intravenous Given 04/16/20 1747)    ED Course  I have reviewed the triage vital signs and the nursing notes.  Pertinent labs & imaging results that were available during my care of the patient were reviewed by me and considered in my medical decision making (see chart for details).  Clinical Course as of Apr 16 1917  Wed Apr 16, 2020  1615 Called to bedside for evaluation.  Patient roomed complaining of 2 days of abdominal pain, nausea, vomiting, constipation.  He is also complaining of headache.  Noncompliant with blood pressure medications x2 weeks.  On evaluation he is significantly diaphoretic very uncomfortable.  Tenderness to the  epigastrium and towards the left lower quadrant.  He is significantly hypertensive with blood pressure over 200 systolic.  CT  dissection study ordered.  Troponin added onto labs.  Pain medication antiemetics ordered.  Dr. [name] made aware.    [JR]  1728 CT scan with no acute significant pancreatitis, confirmed with radiologist.  Lipase did resolve as 33.  Patient denies alcohol use.  While on triglyceride level, treat blood pressure, continue pain medication and fluids.  Will admit for further management.  Also obtain right upper quadrant ultrasound to evaluate for possible gallstone pancreatitis.   [JR]    Clinical Course User Index [JR] Marko Skalski, [place] N, PA-C   MDM Rules/Calculators/A&P                          Patient presenting to the ED with 2 days of abdominal pain and constipation.  He states pain is progressively worsened.  He was noted to have a fall in the waiting room due to pain and was brought to the room due to significant diaphoresis.  On evaluation he is ill-appearing and profoundly diaphoretic.  He appears very uncomfortable.  He has abdominal tenderness in the mid to left abdomen.  No palpable pulsatile mass though patient is obese.  He denies chest pain, shortness of breath or neuro symptoms.  He is also noted to be significantly hypertensive at 233/127.  Noncompliant with lisinopril x2 weeks.  Given patient's presentation with profound diaphoresis, significant abdominal pain, significant hypertension, concern for abdominal aortic dissection.  Dissection study ordered, CT imaging aware of patient's need for urgent imaging.  Pain treated, antiemetics administered.  CT scan is negative for dissection does however show acute pancreatitis.  No evidence of bowel obstruction or mesenteric ischemia.  Confirmed with radiologist by phone.  His lipase however is within normal limits at 33.  White count is normal at 9.  Metabolic panel with creatinine of 1.4, normal LFTs.  Troponin is also within normal limits.  Lactic acid is elevated at 3.  He is given IV fluid infusion with concern for significant hypertension.   Patient's hypertension treated with hydralazine and labetalol with minimal relief.  Nitro drip initiated for blood pressure control as patient remains at 220/139 after medications.  Triglycerides pending.  Right upper quadrant ultrasound without stones.  Unsure of etiology of pancreatitis at this time.  Patient will be admitted to for acute pancreatitis as well as hypertensive urgency.  Patient is agreeable with plan.   Patient discussed with and evaluated by Dr. [name]. Final Clinical Impression(s) / ED Diagnoses Final diagnoses:  Pancreatitis  Hypertensive urgency    Rx / DC Orders ED Discharge Orders    None       Rayonna Heldman, [place] N, PA-C 04/16/20 1918    Gerhard Munch, MD 04/16/20 2323

## 2020-04-16 NOTE — ED Notes (Signed)
208/130 manual bp

## 2020-04-16 NOTE — ED Notes (Signed)
This rn attempted automated BP 3 times without success.

## 2020-04-16 NOTE — ED Notes (Signed)
PT fell to the floor.  Began c/o intense abdominal pain.  Became extremely diaphoretic.

## 2020-04-16 NOTE — ED Triage Notes (Signed)
Pt reports not taking his bp meds x 1 week and now has headache. Also reports n/v x 2 days and being constipated, last bm two days ago.

## 2020-04-17 DIAGNOSIS — K8500 Idiopathic acute pancreatitis without necrosis or infection: Secondary | ICD-10-CM

## 2020-04-17 DIAGNOSIS — F1490 Cocaine use, unspecified, uncomplicated: Secondary | ICD-10-CM

## 2020-04-17 DIAGNOSIS — I160 Hypertensive urgency: Secondary | ICD-10-CM | POA: Diagnosis present

## 2020-04-17 DIAGNOSIS — N189 Chronic kidney disease, unspecified: Secondary | ICD-10-CM

## 2020-04-17 DIAGNOSIS — N179 Acute kidney failure, unspecified: Secondary | ICD-10-CM

## 2020-04-17 DIAGNOSIS — G4733 Obstructive sleep apnea (adult) (pediatric): Secondary | ICD-10-CM

## 2020-04-17 DIAGNOSIS — I129 Hypertensive chronic kidney disease with stage 1 through stage 4 chronic kidney disease, or unspecified chronic kidney disease: Secondary | ICD-10-CM

## 2020-04-17 DIAGNOSIS — E662 Morbid (severe) obesity with alveolar hypoventilation: Secondary | ICD-10-CM

## 2020-04-17 DIAGNOSIS — K859 Acute pancreatitis without necrosis or infection, unspecified: Secondary | ICD-10-CM | POA: Diagnosis present

## 2020-04-17 HISTORY — DX: Cocaine use, unspecified, uncomplicated: F14.90

## 2020-04-17 HISTORY — DX: Acute pancreatitis without necrosis or infection, unspecified: K85.90

## 2020-04-17 LAB — CBC
HCT: 62.2 % — ABNORMAL HIGH (ref 39.0–52.0)
Hemoglobin: 20 g/dL — ABNORMAL HIGH (ref 13.0–17.0)
MCH: 31.5 pg (ref 26.0–34.0)
MCHC: 32.2 g/dL (ref 30.0–36.0)
MCV: 98.1 fL (ref 80.0–100.0)
Platelets: 215 10*3/uL (ref 150–400)
RBC: 6.34 MIL/uL — ABNORMAL HIGH (ref 4.22–5.81)
RDW: 13.2 % (ref 11.5–15.5)
WBC: 13.2 10*3/uL — ABNORMAL HIGH (ref 4.0–10.5)
nRBC: 0 % (ref 0.0–0.2)

## 2020-04-17 LAB — CBG MONITORING, ED
Glucose-Capillary: 140 mg/dL — ABNORMAL HIGH (ref 70–99)
Glucose-Capillary: 151 mg/dL — ABNORMAL HIGH (ref 70–99)
Glucose-Capillary: 152 mg/dL — ABNORMAL HIGH (ref 70–99)
Glucose-Capillary: 163 mg/dL — ABNORMAL HIGH (ref 70–99)

## 2020-04-17 LAB — COMPREHENSIVE METABOLIC PANEL
ALT: 33 U/L (ref 0–44)
AST: 34 U/L (ref 15–41)
Albumin: 4.3 g/dL (ref 3.5–5.0)
Alkaline Phosphatase: 80 U/L (ref 38–126)
Anion gap: 12 (ref 5–15)
BUN: 19 mg/dL (ref 6–20)
CO2: 27 mmol/L (ref 22–32)
Calcium: 9.9 mg/dL (ref 8.9–10.3)
Chloride: 103 mmol/L (ref 98–111)
Creatinine, Ser: 1.43 mg/dL — ABNORMAL HIGH (ref 0.61–1.24)
GFR calc Af Amer: >60 mL/min (ref >60)
GFR calc non Af Amer: 54 mL/min — ABNORMAL LOW (ref >60)
Glucose, Bld: 149 mg/dL — ABNORMAL HIGH (ref 70–99)
Potassium: 4.8 mmol/L (ref 3.5–5.1)
Sodium: 142 mmol/L (ref 135–145)
Total Bilirubin: 0.9 mg/dL (ref 0.3–1.2)
Total Protein: 8.8 g/dL — ABNORMAL HIGH (ref 6.5–8.1)

## 2020-04-17 LAB — RAPID URINE DRUG SCREEN, HOSP PERFORMED
Amphetamines: NOT DETECTED
Barbiturates: NOT DETECTED
Benzodiazepines: NOT DETECTED
Cocaine: POSITIVE — AB
Opiates: POSITIVE — AB
Tetrahydrocannabinol: NOT DETECTED

## 2020-04-17 LAB — ETHANOL: Alcohol, Ethyl (B): <10 mg/dL (ref <10)

## 2020-04-17 LAB — HEMOGLOBIN A1C
Hgb A1c MFr Bld: 5.2 % (ref 4.8–5.6)
Mean Plasma Glucose: 102.54 mg/dL

## 2020-04-17 LAB — PHOSPHORUS: Phosphorus: 4.9 mg/dL — ABNORMAL HIGH (ref 2.5–4.6)

## 2020-04-17 LAB — HIV ANTIBODY (ROUTINE TESTING W REFLEX): HIV Screen 4th Generation wRfx: NONREACTIVE

## 2020-04-17 LAB — TSH: TSH: 0.761 u[IU]/mL (ref 0.350–4.500)

## 2020-04-17 LAB — MAGNESIUM: Magnesium: 2.2 mg/dL (ref 1.7–2.4)

## 2020-04-17 LAB — AMYLASE: Amylase: 590 U/L — ABNORMAL HIGH (ref 28–100)

## 2020-04-17 MED ORDER — NITROPRUSSIDE SODIUM-NACL 20-0.9 MG/100ML-% IV SOLN
0.0000 ug/kg/min | INTRAVENOUS | Status: DC
  Administered 2020-04-17: 0.5 ug/kg/min via INTRAVENOUS
  Filled 2020-04-17 (×2): qty 100

## 2020-04-17 MED ORDER — LORAZEPAM 1 MG PO TABS: 1.0000 mg | ORAL_TABLET | ORAL | Status: DC | PRN

## 2020-04-17 MED ORDER — LACTATED RINGERS IV BOLUS
500.0000 mL | Freq: Once | INTRAVENOUS | Status: AC
  Administered 2020-04-17: 500 mL via INTRAVENOUS

## 2020-04-17 MED ORDER — LORAZEPAM 2 MG/ML IJ SOLN: 1.0000 mg | INTRAMUSCULAR | Status: DC | PRN

## 2020-04-17 MED ORDER — FOLIC ACID 1 MG PO TABS: 1.0000 mg | ORAL_TABLET | Freq: Every day | ORAL | Status: DC

## 2020-04-17 MED ORDER — AMLODIPINE BESYLATE 5 MG PO TABS
10.0000 mg | ORAL_TABLET | Freq: Every day | ORAL | Status: DC
  Administered 2020-04-17: 10 mg via ORAL
  Filled 2020-04-17 (×2): qty 2

## 2020-04-17 MED ORDER — ADULT MULTIVITAMIN W/MINERALS CH
1.0000 | ORAL_TABLET | Freq: Every day | ORAL | Status: DC
  Administered 2020-04-17 – 2020-04-19 (×2): 1 via ORAL
  Filled 2020-04-17 (×2): qty 1

## 2020-04-17 MED ORDER — LOSARTAN POTASSIUM 50 MG PO TABS
50.0000 mg | ORAL_TABLET | Freq: Every day | ORAL | Status: DC
  Administered 2020-04-17: 50 mg via ORAL
  Filled 2020-04-17 (×3): qty 1

## 2020-04-17 MED ORDER — LACTATED RINGERS IV SOLN: INTRAVENOUS | Status: AC

## 2020-04-17 MED ORDER — THIAMINE HCL 100 MG/ML IJ SOLN: 100.0000 mg | Freq: Every day | INTRAMUSCULAR | Status: DC

## 2020-04-17 MED ORDER — THIAMINE HCL 100 MG PO TABS: 100.0000 mg | ORAL_TABLET | Freq: Every day | ORAL | Status: DC

## 2020-04-17 MED ORDER — HYDROMORPHONE HCL 1 MG/ML IJ SOLN
0.5000 mg | INTRAMUSCULAR | Status: DC | PRN
  Administered 2020-04-17 – 2020-04-19 (×9): 1 mg via INTRAVENOUS
  Filled 2020-04-17 (×9): qty 1

## 2020-04-17 NOTE — ED Notes (Signed)
Admitting at bedside 

## 2020-04-17 NOTE — Progress Notes (Signed)
Subjective:   Mr. Strawser states that his abdominal pain has significantly improved since yesterday. He states that his nausea has resolved and has not vomited, although he does not have any appetite. He also notes that he continues to have constipation although he continues to pass gas. He notes a skin lesion on the left side of his abdomen that he states has not changed recently. He denies any fevers, chills, light-headedness, CP, SOB or any other symptoms. He states he is "unsure" of the last time he used cocaine and denies other drug use.   Objective:  Vital signs in last 24 hours: Vitals:   04/17/20 1515 04/17/20 1700 04/17/20 1715 04/17/20 1730  BP: (!) 137/123 124/89 127/87 (!) 139/119  Pulse: 92 (!) 111 (!) 115 (!) 115  Resp: (!) 25 (!) 23 (!) 24 (!) 24  Temp:      TempSrc:      SpO2: 91% 94% 95% 93%  Weight:       General: Patient appears well. No acute distress. Eyes: Sclera non-icteric. No conjunctival injection.  Respiratory: Patient has mild tachypnea, but lungs are CTA, bilaterally. No wheezes, rales, or rhonchi.  Cardiovascular: Regular rate and rhythm. No murmurs, rubs, or gallops. No lower extremity edema. Abdominal: Abdomen is soft but distended, without TTP. No rebound or guarding.  Bowel sounds intact.  Psych: Normal affect. Normal tone of voice.   Assessment/Plan:  Principal Problem:   Acute pancreatitis Active Problems:   Asymptomatic hypertensive urgency   Cocaine use  # Acute Pancreatitis of Unclear Etiology Patient was admitted for 2 days of severe epigastric pain, nausea, and vomiting and diagnosed with acute pancreatitis with positive CT findings and elevated amylase. He also has a leukocytosis. Lipase, triglycerides, calcium WNL, RUS U/S did not show evidence of stones, no recent abdominal procedures, no reported alcohol use, and no recent medications. Abdominal pain is improving today and N/V have resolved. He developed tachycardia and tachypnea this  afternoon. - Pain well controlled on IV dilaudid 0.5-1mg  q2 hours; however, will decrease frequency to q4 hours given decreased oxygenation - Will advance diet as tolerated, although patient denies appetite - Will start on LR 134mL/hr given tachycardia and monitor fluid status  # Severe Asymptomatic Hypertension Patient has taken amlodipine, lisinopril and HCTZ at various timepoints throughout the past 3 years due to poorly controlled HTN but has not taken his medications for quite some time. His BP's have overcorrected since admission from ~220 to 130's systolic s/p NTG drip and amlodipine 10mg  and losartan 50mg  this morning. Elevation on admission may also be secondary to severe pain and near-normal blood pressures may also be in the setting of dehydration currently.  - Will hold amlodipine 10mg  and losartan 50mg  for now - Will give LR 138mL/hr  - Will continue to monitor closely   # OSA vs. OHS Patient has a documented history of OSA from PCP in 2017, not on CPAP currently at home. Patient has been tachypneic since this morning, although saturations remain stable 91-96% on room air. Lung sounds clear on exam. - Continue CPAP nightly   # AKI vs. CKD Patient had creatinine of 1.41 initially, elevated from baseline ~1.2-1.3. Most likely either prerenal due to poor PO intake in setting of pancreatitis vs. Progression of CKD 2/2 uncontrolled hypertension.  - Will start on IVF - continue to monitor CMP  Code Status: Full code Diet: Low fat diet as tolerated IVF: LR 111mL/hr DVT PPx: Lovenox  Prior to Admission Living Arrangement:  Home Anticipated Discharge Location: Home Barriers to Discharge: Unstable Vitals, Not tolerating PO  Glenford Bayley, MD 04/17/2020, 7:04 PM Pager: [phone] After 5pm on weekdays and 1pm on weekends: On Call pager [phone]

## 2020-04-17 NOTE — ED Notes (Signed)
Went to check on Pt and Pt had left room to [employer].

## 2020-04-17 NOTE — ED Notes (Signed)
Patient is extremely uncomfortable c/o abd. Pain . Medication given.

## 2020-04-17 NOTE — ED Notes (Signed)
Spoke to admitting in concerns with trending high bp. New orders placed for IV hydralazine and lopressor. Along with lab: amylase

## 2020-04-17 NOTE — ED Notes (Signed)
Spoke to IM resident to place new orders regarding trending high BP

## 2020-04-17 NOTE — ED Notes (Signed)
Pt continues to remove pulse ox and BP cuff. Have had to replace 3 pulse ox's and continue to keep Pt hooked to monitor.

## 2020-04-17 NOTE — ED Notes (Signed)
Offered Pt tray but Pt did not want any food

## 2020-04-17 NOTE — ED Notes (Signed)
IM requested RN to given amlodipine and losartan. Unable to pull from pyxis. Called pharmacy requesting medication

## 2020-04-17 NOTE — ED Notes (Signed)
Pt became diaphoretic again. After speaking with Pt it appears he becomes diaphoretic with onset of sharp pain.

## 2020-04-17 NOTE — ED Notes (Signed)
Visually checked Pt. Pt was resting with normal breathing pattern and consistent vitals

## 2020-04-17 NOTE — ED Notes (Signed)
188/108 manual bp

## 2020-04-18 ENCOUNTER — Inpatient Hospital Stay (HOSPITAL_COMMUNITY): Payer: BLUE CROSS/BLUE SHIELD

## 2020-04-18 DIAGNOSIS — N179 Acute kidney failure, unspecified: Secondary | ICD-10-CM

## 2020-04-18 DIAGNOSIS — I160 Hypertensive urgency: Secondary | ICD-10-CM

## 2020-04-18 DIAGNOSIS — K859 Acute pancreatitis without necrosis or infection, unspecified: Secondary | ICD-10-CM

## 2020-04-18 HISTORY — DX: Acute kidney failure, unspecified: N17.9

## 2020-04-18 LAB — COMPREHENSIVE METABOLIC PANEL
ALT: 27 U/L (ref 0–44)
ALT: 27 U/L (ref 0–44)
AST: 75 U/L — ABNORMAL HIGH (ref 15–41)
AST: 74 U/L — ABNORMAL HIGH (ref 15–41)
Albumin: 3.6 g/dL (ref 3.5–5.0)
Albumin: 3.3 g/dL — ABNORMAL LOW (ref 3.5–5.0)
Alkaline Phosphatase: 67 U/L (ref 38–126)
Alkaline Phosphatase: 66 U/L (ref 38–126)
Anion gap: 16 — ABNORMAL HIGH (ref 5–15)
Anion gap: 16 — ABNORMAL HIGH (ref 5–15)
BUN: 58 mg/dL — ABNORMAL HIGH (ref 6–20)
BUN: 66 mg/dL — ABNORMAL HIGH (ref 6–20)
CO2: 20 mmol/L — ABNORMAL LOW (ref 22–32)
CO2: 18 mmol/L — ABNORMAL LOW (ref 22–32)
Calcium: 9.1 mg/dL (ref 8.9–10.3)
Calcium: 8.9 mg/dL (ref 8.9–10.3)
Chloride: 100 mmol/L (ref 98–111)
Chloride: 101 mmol/L (ref 98–111)
Creatinine, Ser: 5.17 mg/dL — ABNORMAL HIGH (ref 0.61–1.24)
Creatinine, Ser: 5.58 mg/dL — ABNORMAL HIGH (ref 0.61–1.24)
GFR calc Af Amer: 13 mL/min — ABNORMAL LOW (ref >60)
GFR calc Af Amer: 12 mL/min — ABNORMAL LOW (ref >60)
GFR calc non Af Amer: 11 mL/min — ABNORMAL LOW (ref >60)
GFR calc non Af Amer: 10 mL/min — ABNORMAL LOW (ref >60)
Glucose, Bld: 133 mg/dL — ABNORMAL HIGH (ref 70–99)
Glucose, Bld: 162 mg/dL — ABNORMAL HIGH (ref 70–99)
Potassium: 5.3 mmol/L — ABNORMAL HIGH (ref 3.5–5.1)
Potassium: 5.1 mmol/L (ref 3.5–5.1)
Sodium: 136 mmol/L (ref 135–145)
Sodium: 135 mmol/L (ref 135–145)
Total Bilirubin: 2.2 mg/dL — ABNORMAL HIGH (ref 0.3–1.2)
Total Bilirubin: 2 mg/dL — ABNORMAL HIGH (ref 0.3–1.2)
Total Protein: 7.3 g/dL (ref 6.5–8.1)
Total Protein: 6.9 g/dL (ref 6.5–8.1)

## 2020-04-18 LAB — CBC
HCT: 57.4 % — ABNORMAL HIGH (ref 39.0–52.0)
HCT: 53.9 % — ABNORMAL HIGH (ref 39.0–52.0)
Hemoglobin: 18.2 g/dL — ABNORMAL HIGH (ref 13.0–17.0)
Hemoglobin: 17 g/dL (ref 13.0–17.0)
MCH: 31.5 pg (ref 26.0–34.0)
MCH: 31.4 pg (ref 26.0–34.0)
MCHC: 31.7 g/dL (ref 30.0–36.0)
MCHC: 31.5 g/dL (ref 30.0–36.0)
MCV: 99.5 fL (ref 80.0–100.0)
MCV: 99.6 fL (ref 80.0–100.0)
Platelets: 177 10*3/uL (ref 150–400)
Platelets: 154 10*3/uL (ref 150–400)
RBC: 5.77 MIL/uL (ref 4.22–5.81)
RBC: 5.41 MIL/uL (ref 4.22–5.81)
RDW: 13.9 % (ref 11.5–15.5)
RDW: 14.1 % (ref 11.5–15.5)
WBC: 17.8 10*3/uL — ABNORMAL HIGH (ref 4.0–10.5)
WBC: 17.7 10*3/uL — ABNORMAL HIGH (ref 4.0–10.5)
nRBC: 0 % (ref 0.0–0.2)
nRBC: 0 % (ref 0.0–0.2)

## 2020-04-18 LAB — GLUCOSE, CAPILLARY: Glucose-Capillary: 150 mg/dL — ABNORMAL HIGH (ref 70–99)

## 2020-04-18 LAB — CBG MONITORING, ED: Glucose-Capillary: 137 mg/dL — ABNORMAL HIGH (ref 70–99)

## 2020-04-18 LAB — LACTIC ACID, PLASMA: Lactic Acid, Venous: 2.8 mmol/L (ref 0.5–1.9)

## 2020-04-18 MED ORDER — SODIUM CHLORIDE 0.9 % IV SOLN: INTRAVENOUS | Status: DC

## 2020-04-18 MED ORDER — LACTATED RINGERS IV SOLN: INTRAVENOUS | Status: DC

## 2020-04-18 MED ORDER — SODIUM BICARBONATE 650 MG PO TABS
1300.0000 mg | ORAL_TABLET | Freq: Two times a day (BID) | ORAL | Status: DC
  Administered 2020-04-18 – 2020-04-19 (×2): 1300 mg via ORAL
  Filled 2020-04-18 (×3): qty 2

## 2020-04-18 MED ORDER — HEPARIN SODIUM (PORCINE) 5000 UNIT/ML IJ SOLN
5000.0000 [IU] | Freq: Three times a day (TID) | INTRAMUSCULAR | Status: DC
  Administered 2020-04-18 – 2020-04-19 (×2): 5000 [IU] via SUBCUTANEOUS
  Filled 2020-04-18 (×2): qty 1

## 2020-04-18 MED ORDER — POLYETHYLENE GLYCOL 3350 17 G PO PACK
17.0000 g | PACK | Freq: Every day | ORAL | Status: DC
  Filled 2020-04-18: qty 1

## 2020-04-18 MED ORDER — SENNA 8.6 MG PO TABS: 1.0000 | ORAL_TABLET | Freq: Every day | ORAL | Status: DC

## 2020-04-18 MED ORDER — POLYETHYLENE GLYCOL 3350 17 G PO PACK: 17.0000 g | PACK | Freq: Two times a day (BID) | ORAL | Status: DC

## 2020-04-18 NOTE — ED Notes (Signed)
Patient had an unwitnessed fall. Patient reports rolling out of bed, found sitting on stool. Denies any injuries and declined further evaluation by Doctor. MD on-call notified.

## 2020-04-18 NOTE — Progress Notes (Signed)
Subjective:   Ricky Boyle states that he continues not to have an appetite. He endorses mild upper epigastric abdominal tenderness that does not radiate. He continues to experience constipation but says he is passing gas. He denies any other symptoms including fevers, chills, decreased or difficulty urinating, hematuria, weakness, or SOB.  Objective:  Vital signs in last 24 hours: Vitals:   04/18/20 0609 04/18/20 1333 04/18/20 1334 04/18/20 1337  BP: (!) 149/106  (!) 161/107   Pulse:   (!) 120   Resp: (!) 25 19    Temp:      TempSrc:      SpO2:    95%  Weight:       General: Patient appears well. No acute distress. Eyes: Minimal scleral icterus. No conjunctival injection.  Respiratory: Patient has mild tachypnea, but lungs are CTA, bilaterally. No wheezes, rales, or rhonchi.  Cardiovascular: Rate is tachycardic. Rhythm is regular. No murmurs, rubs, or gallops. No lower extremity edema. Abdominal: Abdomen distended but soft, with minimal epigastric TTP. Negative Murphy's sign. No rebound or guarding.  Bowel sounds intact.  Psych: Normal affect. Normal tone of voice.   Assessment/Plan:  Principal Problem:   Acute pancreatitis Active Problems:   Asymptomatic hypertensive urgency   Cocaine use  # Acute Pancreatitis of Unclear Etiology Patient was admitted for 2 days of severe epigastric pain, nausea, and vomiting and diagnosed with acute pancreatitis with positive CT findings and elevated amylase. He also has a leukocytosis. Lipase, triglycerides, calcium WNL, RUS U/S did not show evidence of stones, no recent abdominal procedures, no reported alcohol use, and no recent medications.   Abdominal pain remains constant in epigastric region with resolved nausea and vomiting but continued decreased appetite, though noted due to fear of constipation not pain. LA decreased to 2.8, TB 2.0. - Pain overall well controlled on IV dilaudid 0.5-1mg  q4 hours - Will advance diet as tolerated,  although patient denies appetite - Fluids increased to LR 299mL/hr overnight, switched to NS @ 143mL/hr today due to hyperkalemia - Will start Senokot and Miralax daily for constipation  # Severe Asymptomatic Hypertension Blood pressures initially in 200's systolic in ED, although rapidly decreased s/p Nitro gtt, amlodipine and Losartan on admission.  - Will hold amlodipine 10mg  and losartan 50mg  for now given rapid decrease in BP  - Continue NS 155mL/hr for now - Will continue to monitor closely  - Plasma metanephrines pending   # Severe AKI on possible CKD, likely 2/2 ATN Creatinine increased acutely from 1.43 on admission to 5.17 >> 5.58 today. Likely due to ATN in the setting of rapid correction of HTN as well as contrast from CTA chest. - nephrology consulted, appreciate their recommendations  - Will check urine sodium, urine protein:creatinine ratio  - Will check urinalysis  - Will check complete abdominal and renal duplex 80m to r/o medical renal and obstructive renal pathology - continue to monitor CMP  # OSA vs. OHS w/ Polycythemia  Patient has a documented history of OSA from PCP in 2017, not on CPAP currently at home. Patient has been tachypneic since this morning, although saturations remain stable 91-96% on room air. Lung sounds clear on exam. Hgb up to 20, may be secondary to chronic hypoxia. - Continue CPAP nightly   Code Status: Full code Diet: Low fat diet as tolerated IVF: NS 156mL/hr DVT PPx: Lovenox  Prior to Admission Living Arrangement: Home Anticipated Discharge Location: Home Barriers to Discharge: Not tolerating PO   2018,  MD 04/18/2020, 3:51 PM Pager: [phone] After 5pm on weekdays and 1pm on weekends: On Call pager [phone]

## 2020-04-18 NOTE — Consult Note (Signed)
[facility] Renal Consultation Note  Requesting MD: Jessy Oto, MD Indication for Consultation:  AKI   Chief complaint: abd pain   HPI:  Ricky Boyle is a 59 y.o. male with a past medical history of hypertension who presented to the hospital with "pain in my stomach" and a headache.  He is also has some shortness of breath but no nausea or vomiting.  He was found to have pancreatitis and HTN emergency.  He received a CTA chest/abd/pelvis yesterday which ruled out any acute aortic syndrome.  Of note, the patient's blood pressure was markedly elevated on presentation at 200/110 (peak around 258/158) and reached a nadir of 110 - 130's systolic.  Nephrology is consulted for acute kidney injury.  On US kidneys without mass or hydro.  Noted a renal artery duplex was obtained and renal artery appears patient.   No urine output is documented but he tells me that he has been urinating without difficulty and strict ins and outs are ordered.  With regard to medications he does not recognize HCTZ.  He states that is normally on lisinopril and amlodipine but has been out of medication for a couple of weeks and has been taking his sister's medication.  He uses Aleve a couple of times a month  Creatinine, Ser  Date/Time Value Ref Range Status  04/18/2020 12:40 PM 5.58 (H) 0.61 - 1.24 mg/dL Final  17/61/6073 71:06 AM 5.17 (H) 0.61 - 1.24 mg/dL Final    Comment:    DELTA CHECK NOTED  04/17/2020 03:05 AM 1.43 (H) 0.61 - 1.24 mg/dL Final  26/94/8546 27:03 AM 1.41 (H) 0.61 - 1.24 mg/dL Final  50/04/3817 29:93 AM 1.24 0.61 - 1.24 mg/dL Final  71/69/6789 38:10 PM 1.66 (H) 0.61 - 1.24 mg/dL Final  17/51/0258 52:77 PM 1.30 0.50 - 1.35 mg/dL Final  82/42/3536 14:43 AM 1.30 0.50 - 1.35 mg/dL Final  15/40/0867 61:95 PM 1.15 0.40 - 1.50 mg/dL Final  09/32/6712 45:80 PM 1.28 0.40 - 1.50 mg/dL Final  99/83/3825 05:39 AM 0.9 0.40 - 1.50 mg/dL Final  76/73/4193 79:02 PM 1.44 0.40 - 1.50 mg/dL Final      PMHx:   Past Medical History:  Diagnosis Date  . Hypertension     Family Hx: He is not aware of any family history of CKD or ESRD  Social History:  reports that he has been smoking cigarettes. He has a 12.50 pack-year smoking history. He has never used smokeless tobacco. He reports that he does not drink alcohol and does not use drugs.  He states that he has not smoked in 5 days and is motivated to quit now he smokes 2 packs/day.  Denies alcohol use.  Allergies:  Allergies  Allergen Reactions  . Bee Venom Anaphylaxis  . Shrimp [Shellfish Allergy] Anaphylaxis    Medications: Prior to Admission medications   Medication Sig Start Date End Date Taking? Authorizing Provider  amLODipine (NORVASC) 10 MG tablet Take 1 tablet (10 mg total) by mouth daily. 01/11/20 04/16/20 Yes Curatolo, Adam, DO  amLODipine-benazepril (LOTREL) 10-20 MG capsule Take 1 capsule by mouth daily. Patient not taking: Reported on 04/16/2020 05/26/16   Loren Racer, MD  hydrochlorothiazide (HYDRODIURIL) 25 MG tablet Take 1 tablet (25 mg total) by mouth daily. Patient not taking: Reported on 04/16/2020 06/07/18   Gerhard Munch, MD  lisinopril (PRINIVIL,ZESTRIL) 10 MG tablet Take 1 tablet (10 mg total) by mouth daily. Patient not taking: Reported on 04/16/2020 06/07/18   Gerhard Munch, MD  I have reviewed the patient's current and reported prior to admission medications.  Labs:  BMP Latest Ref Rng & Units 04/18/2020 04/18/2020 04/17/2020  Glucose 70 - 99 mg/dL 361(W) 431(V) 400(Q)  BUN 6 - 20 mg/dL 67(Y) 19(J) 19  Creatinine 0.61 - 1.24 mg/dL 0.93(O) 6.71(I) 4.58(K)  Sodium 135 - 145 mmol/L 135 136 142  Potassium 3.5 - 5.1 mmol/L 5.1 5.3(H) 4.8  Chloride 98 - 111 mmol/L 101 100 103  CO2 22 - 32 mmol/L 18(L) 20(L) 27  Calcium 8.9 - 10.3 mg/dL 8.9 9.1 9.9    Urinalysis    Component Value Date/Time   COLORURINE STRAW (A) 04/16/2020 1644   APPEARANCEUR CLEAR 04/16/2020 1644   LABSPEC 1.016  04/16/2020 1644   PHURINE 6.0 04/16/2020 1644   GLUCOSEU NEGATIVE 04/16/2020 1644   HGBUR SMALL (A) 04/16/2020 1644   BILIRUBINUR NEGATIVE 04/16/2020 1644   KETONESUR NEGATIVE 04/16/2020 1644   PROTEINUR NEGATIVE 04/16/2020 1644   UROBILINOGEN 1.0 12/02/2009 1113   NITRITE NEGATIVE 04/16/2020 1644   LEUKOCYTESUR NEGATIVE 04/16/2020 1644     ROS:  Pertinent items noted in HPI and remainder of comprehensive ROS otherwise negative.    Physical Exam: Vitals:   04/18/20 1334 04/18/20 1337  BP: (!) 161/107   Pulse: (!) 120   Resp:    Temp:    SpO2:  95%     General: adult male in bed in NAD  HEENT: NCAT Eyes: EOMI sclera aniceric Neck: supple trachea midline Heart: tachycardic S1S2 no rub Lungs: clear to auscultation normal work of breathing at rest; on 3.5 liters Abdomen: soft but distended and tender Extremities: no pitting edema; no cyanosis or clubbing Skin: no rash on extremities exposed Neuro: alert and oriented; conversant and follows commands Psych normal mood and affect  Assessment/Plan:  # AKI  - Multifactorial with ischemic insults from normotensive ATN in the setting of lisinopril use, questionable dual RAAS blockade (out of his meds, taking his sister's meds) and now status post CTA/contrasted scan.  UA with neg protein and 0-5 RBC - I have discussed that with the trajectory of his creatinine rise he has a high risk of progression to requiring dialysis at some point over his hospitalization.  No acute need.  (Should he require it, this may only be temporary and we have discussed this as well) - We will monitor closely for the need for dialysis  - Will make NPO after midnight in the event access is needed for 9/18 - Strict ins/ous and daily weights  - discussed to avoid future NSAID's and discussed low K diet with the patient - check post void residual bladder scan and place foley if retains over 250 mL - ordered  # HTN emergency - Goal BP would be in the  170's-180's range for now - would avoid hypotension    # Acute Pancreatitis  - supportive care per primary team  - Continue IV fluids  # Hyperkalemia - Changed to renal diet   # Metabolic acidosis - Setting of AKI - Start bicarbonate supplementation    [name] 04/18/2020, 5:56 PM

## 2020-04-19 ENCOUNTER — Inpatient Hospital Stay (HOSPITAL_COMMUNITY)
Admission: EM | Admit: 2020-04-19 | Discharge: 2020-04-25 | Disposition: A | Discharge status: Home / Self Care | Payer: BLUE CROSS/BLUE SHIELD | Source: Home / Self Care | Attending: Internal Medicine | Admitting: Internal Medicine

## 2020-04-19 ENCOUNTER — Other Ambulatory Visit: Payer: Self-pay

## 2020-04-19 DIAGNOSIS — R739 Hyperglycemia, unspecified: Secondary | ICD-10-CM | POA: Diagnosis present

## 2020-04-19 DIAGNOSIS — I129 Hypertensive chronic kidney disease with stage 1 through stage 4 chronic kidney disease, or unspecified chronic kidney disease: Secondary | ICD-10-CM | POA: Diagnosis present

## 2020-04-19 DIAGNOSIS — Z79899 Other long term (current) drug therapy: Secondary | ICD-10-CM

## 2020-04-19 DIAGNOSIS — G4733 Obstructive sleep apnea (adult) (pediatric): Secondary | ICD-10-CM | POA: Diagnosis present

## 2020-04-19 DIAGNOSIS — F1490 Cocaine use, unspecified, uncomplicated: Secondary | ICD-10-CM | POA: Diagnosis present

## 2020-04-19 DIAGNOSIS — N179 Acute kidney failure, unspecified: Secondary | ICD-10-CM | POA: Diagnosis present

## 2020-04-19 DIAGNOSIS — Z20822 Contact with and (suspected) exposure to covid-19: Secondary | ICD-10-CM | POA: Diagnosis present

## 2020-04-19 DIAGNOSIS — Z91030 Bee allergy status: Secondary | ICD-10-CM

## 2020-04-19 DIAGNOSIS — I10 Essential (primary) hypertension: Secondary | ICD-10-CM

## 2020-04-19 DIAGNOSIS — K859 Acute pancreatitis without necrosis or infection, unspecified: Secondary | ICD-10-CM | POA: Diagnosis present

## 2020-04-19 DIAGNOSIS — Z87892 Personal history of anaphylaxis: Secondary | ICD-10-CM

## 2020-04-19 DIAGNOSIS — N1831 Chronic kidney disease, stage 3a: Secondary | ICD-10-CM | POA: Diagnosis present

## 2020-04-19 DIAGNOSIS — I161 Hypertensive emergency: Secondary | ICD-10-CM | POA: Diagnosis present

## 2020-04-19 DIAGNOSIS — F17210 Nicotine dependence, cigarettes, uncomplicated: Secondary | ICD-10-CM | POA: Diagnosis present

## 2020-04-19 DIAGNOSIS — Z91013 Allergy to seafood: Secondary | ICD-10-CM

## 2020-04-19 DIAGNOSIS — Z6841 Body Mass Index (BMI) 40.0 and over, adult: Secondary | ICD-10-CM

## 2020-04-19 DIAGNOSIS — F1410 Cocaine abuse, uncomplicated: Secondary | ICD-10-CM | POA: Diagnosis present

## 2020-04-19 HISTORY — DX: Cocaine abuse, uncomplicated: F14.10

## 2020-04-19 HISTORY — DX: Opioid abuse, uncomplicated: F11.10

## 2020-04-19 LAB — RENAL FUNCTION PANEL
Albumin: 2.9 g/dL — ABNORMAL LOW (ref 3.5–5.0)
Anion gap: 15 (ref 5–15)
BUN: 79 mg/dL — ABNORMAL HIGH (ref 6–20)
CO2: 20 mmol/L — ABNORMAL LOW (ref 22–32)
Calcium: 8.7 mg/dL — ABNORMAL LOW (ref 8.9–10.3)
Chloride: 100 mmol/L (ref 98–111)
Creatinine, Ser: 6.83 mg/dL — ABNORMAL HIGH (ref 0.61–1.24)
GFR calc Af Amer: 9 mL/min — ABNORMAL LOW (ref >60)
GFR calc non Af Amer: 8 mL/min — ABNORMAL LOW (ref >60)
Glucose, Bld: 139 mg/dL — ABNORMAL HIGH (ref 70–99)
Phosphorus: 4.2 mg/dL (ref 2.5–4.6)
Potassium: 5.1 mmol/L (ref 3.5–5.1)
Sodium: 135 mmol/L (ref 135–145)

## 2020-04-19 LAB — GLUCOSE, CAPILLARY: Glucose-Capillary: 151 mg/dL — ABNORMAL HIGH (ref 70–99)

## 2020-04-19 MED ORDER — LIP MEDEX EX OINT
TOPICAL_OINTMENT | CUTANEOUS | Status: DC | PRN
  Filled 2020-04-19: qty 7

## 2020-04-19 NOTE — Progress Notes (Signed)
[facility] Progress Note   Subjective/interval events:   Normotensive overnight 130-140s.  On RA.   No UOP documented but said voiding without difficulty.  Wts not documented since admission wt.  Continued uptrend in cr to 6.83 today. K stable at 5.1, bicarb improved to 20. Patient wanting to sign out AMA.    Background:  HPI:  Ricky Boyle is a 59 y.o. male with a past medical history of hypertension who presented to the hospital with "pain in my stomach" and a headache.  He is also has some shortness of breath but no nausea or vomiting.  He was found to have pancreatitis and HTN emergency.  He received a CTA chest/abd/pelvis yesterday which ruled out any acute aortic syndrome.  Of note, the patient's blood pressure was markedly elevated on presentation at 200/110 (peak around 258/158) and reached a nadir of 110 - 130's systolic.  Nephrology is consulted for acute kidney injury.  On US kidneys without mass or hydro.  Noted a renal artery duplex was obtained and renal artery appears patient.   No urine output is documented but he tells me that he has been urinating without difficulty and strict ins and outs are ordered.  With regard to medications he does not recognize HCTZ.  He states that is normally on lisinopril and amlodipine but has been out of medication for a couple of weeks and has been taking his sister's medication.  He uses Aleve a couple of times a month  Objective Vitals:   04/18/20 1938 04/18/20 2300 04/18/20 2315 04/19/20 0339  BP: 135/63  140/61 138/84  Pulse: (!) 118 (!) 55 (!) 115 (!) 128  Resp: (!) 23 18 19 20   Temp: 98.3 F (36.8 C)  98.5 F (36.9 C) 99.6 F (37.6 C)  TempSrc: Oral  Axillary Oral  SpO2: 94% 100% 100% 100%  Weight:       Physical Exam General: obese, nontoxic Heart: tachycardic, regular Lungs: normal WOB on RA Abdomen: obese Extremities: no edema  Additional Objective Labs: Basic Metabolic Panel: Recent Labs  Lab 04/17/20 0305  04/17/20 0305 04/18/20 0807 04/18/20 1240 04/19/20 0102  NA 142   < > 136 135 135  K 4.8   < > 5.3* 5.1 5.1  CL 103   < > 100 101 100  CO2 27   < > 20* 18* 20*  GLUCOSE 149*   < > 133* 162* 139*  BUN 19   < > 58* 66* 79*  CREATININE 1.43*   < > 5.17* 5.58* 6.83*  CALCIUM 9.9   < > 9.1 8.9 8.7*  PHOS 4.9*  --   --   --  4.2   < > = values in this interval not displayed.   Liver Function Tests: Recent Labs  Lab 04/17/20 0305 04/17/20 0305 04/18/20 0807 04/18/20 1240 04/19/20 0102  AST 34  --  75* 74*  --   ALT 33  --  27 27  --   ALKPHOS 80  --  67 66  --   BILITOT 0.9  --  2.2* 2.0*  --   PROT 8.8*  --  7.3 6.9  --   ALBUMIN 4.3   < > 3.6 3.3* 2.9*   < > = values in this interval not displayed.   Recent Labs  Lab 04/16/20 1105 04/16/20 2130  LIPASE 33  --   AMYLASE  --  590*   CBC: Recent Labs  Lab 04/16/20 1105 04/16/20 1105 04/17/20  2355 04/18/20 0807 04/18/20 1240  WBC 9.0   < > 13.2* 17.8* 17.7*  HGB 16.7   < > 20.0* 18.2* 17.0  HCT 51.7   < > 62.2* 57.4* 53.9*  MCV 96.6  --  98.1 99.5 99.6  PLT 239   < > 215 177 154   < > = values in this interval not displayed.   Blood Culture No results found for: SDES, SPECREQUEST, CULT, REPTSTATUS  Cardiac Enzymes: No results for input(s): CKTOTAL, CKMB, CKMBINDEX, TROPONINI in the last 168 hours. CBG: Recent Labs  Lab 04/17/20 1302 04/17/20 1619 04/17/20 2349 04/18/20 1726 04/18/20 2356  GLUCAP 163* 151* 140* 137* 150*   Iron Studies: No results for input(s): IRON, TIBC, TRANSFERRIN, FERRITIN in the last 72 hours. @lablastinr3 @ Studies/Results: Abdomen Complete  Result Date: 04/18/2020 CLINICAL DATA:  Pancreatitis. Elevated total bilirubin. Kidney injury. EXAM: ABDOMEN ULTRASOUND COMPLETE COMPARISON:  04/16/2020, limited abdomen ultrasound. CT, 04/16/2020. FINDINGS: Gallbladder: No gallstones or wall thickening visualized. No sonographic Murphy sign noted by sonographer. Common bile duct: Diameter:  3 mm Liver: Bowel gas partly obscures the left lobe. Generalized increased echogenicity. Area of relative decreased echogenicity in the right lobe adjacent to the gallbladder, which may reflect focal fatty sparing, measuring 2.3 x 1.9 x 2.4 cm. No other visualized liver mass or lesion. Portal vein is patent on color Doppler imaging with normal direction of blood flow towards the liver. IVC: Not well visualized.  No gross abnormality. Pancreas: Obscured by midline bowel gas. Spleen: Size and appearance within normal limits. Right Kidney: Length: 11.0 cm. Echogenicity within normal limits. No mass or hydronephrosis visualized. Left Kidney: Length: 13.0 cm. Echogenicity within normal limits. No mass or hydronephrosis visualized. Abdominal aorta: Seen in the mid to distal portion. Proximal obscured by bowel gas. No aneurysm. Other findings: None. IMPRESSION: 1. No acute findings. 2. Normal gallbladder.  No bile duct dilation. 3. Liver appearance with increased echogenicity suggests hepatic steatosis. Focal lesion right lobe adjacent to the gallbladder is likely focal fatty sparing. 4. Study somewhat limited by midline bowel gas. Pancreas not visualized. 5. Normal kidneys. Electronically Signed   By: 04/18/2020 M.D.   On: 04/18/2020 12:13   VAS 04/20/2020 RENAL ARTERY DUPLEX  Result Date: 04/18/2020 ABDOMINAL VISCERAL Indications: Acute pancreatitis, asymptomatic hypertensive urgency, and acute              kidney injury. High Risk Factors: Hypertension, hyperlipidemia, current smoker. Limitations: Obesity, patient discomfort and air/bowel gas. Comparison Study: No prior. Performing Technologist: 04/20/2020 RDMS, RVT  Examination Guidelines: A complete evaluation includes B-mode imaging, spectral Doppler, color Doppler, and power Doppler as needed of all accessible portions of each vessel. Bilateral testing is considered an integral part of a complete examination. Limited examinations for reoccurring indications may  be performed as noted.  Duplex Findings: +------------------+--------+--------+--------------+ Right Renal ArteryPSV cm/sEDV cm/s   Comment     +------------------+--------+--------+--------------+ Origin                            not visualized +------------------+--------+--------+--------------+ Proximal                          not visualized +------------------+--------+--------+--------------+ Mid                               not visualized +------------------+--------+--------+--------------+ Distal  62      15       Patent     +------------------+--------+--------+--------------+ +-----------------+--------+--------+--------------+ Left Renal ArteryPSV cm/sEDV cm/s   Comment     +-----------------+--------+--------+--------------+ Origin                           not visualized +-----------------+--------+--------+--------------+ Proximal                         not visualized +-----------------+--------+--------+--------------+ Mid                              not visualized +-----------------+--------+--------+--------------+ Distal              87      29       Patent     +-----------------+--------+--------+--------------+  Technologist observations: Tenically difficult study due to patient's body habitus. Indication for the exam - Limited renal artery study to evaluate the kidneys for perfusion.   Summary: Renal:  Right: Renal artery appears patent at the distal segment with        evidence of perfusion noted within the kidney with color flow        imaging. Left:  Renal artery appears patent at the distal segment with        evidence of perfusion noted within the kidney with color flow        imaging.  *See table(s) above for measurements and observations.  Diagnosing physician: Lemar Livings MD  Electronically signed by Lemar Livings MD on 04/18/2020 at 3:40:45 PM.    Final    Medications: . sodium chloride 100 mL/hr at 04/18/20 1104    . heparin  5,000 Units Subcutaneous Q8H  . multivitamin with minerals  1 tablet Oral Daily  . nicotine  21 mg Transdermal Daily  . polyethylene glycol  17 g Oral Daily  . senna  1 tablet Oral Daily  . sodium bicarbonate  1,300 mg Oral BID    Assessment/Plan:  Patient wanting to sign out AMA.  I had a frank discussion with him regarding his worsening renal function and need for close monitoring.  We discussed development of kidney failure, electrolyte abnormalities which could lead to arrhythmias or even death.  He adamantly refused to stay.  I told him to avoid cocaine; he says he doesn't use it (+Utox).  He has no PCP.  I will have my office reach out to him to arrange follow up next week but based on today's interaction I doubt he will come. Page me if I can do anything to assist.   Estill Bakes MD 04/19/2020, 7:43 AM  [facility] Pager: [phone]

## 2020-04-19 NOTE — Progress Notes (Signed)
Subjective:  [name] told the RN that he is not aware of why he needs a procedure today. I saw him early morning after talked to RN about patient does not agree w procedure and RN and him do not know what procedure the pt will have. Explained that nephrology recommended access placement today for probable HD this admission. I explained that per nephrology, even if he needs HD, it will be temporary. Patient still is not interested in that and states that he does not want HD. He mentions that he wants to go home today but when I explain his kidney function is not good at all and he is not safe to go home today, he states that he will leave but not without our permission. He says he has good amount of urine and bowel movement. He thinks that his kidney is ok. He becomes tearful when hears that he has a kidney injury. I tried to addressed his concerns. He calms down after few seconds and I reassured him no one will do a procedure without his consent.   Objective:  Vital signs in last 24 hours: Vitals:   04/18/20 1938 04/18/20 2300 04/18/20 2315 04/19/20 0339  BP: 135/63  140/61 138/84  Pulse: (!) 118 (!) 55 (!) 115 (!) 128  Resp: (!) 23 18 19 20   Temp: 98.3 F (36.8 C)  98.5 F (36.9 C) 99.6 F (37.6 C)  TempSrc: Oral  Axillary Oral  SpO2: 94% 100% 100% 100%  Weight:       Constitutional: Obese gentleman. No acute distress Cardiovascular: RRR, nl S1S2, no murmur, no LEE Respiratory: Effort normal and breath sounds normal. No respiratory distress. No crackle GI: Soft. Bowel sounds are normal, no tenderness.  Neurological: Is alert and oriented x 3  Skin: Not diaphoretic. No erythema.  Psychiatric: becomes tearful when we talk about we tell him he should stay in hospital  Assessment/Plan:  Principal Problem:   Acute pancreatitis Active Problems:   Asymptomatic hypertensive urgency   Cocaine use   Acute kidney injury (HCC)   # Acute Pancreatitis of Unclear Etiology No stones on  , no recent abdominal procedures, no reported alcohol use, and no recent medications or scorpion bite.   Pain improved. N/V improved  - Pain overall well controlled on IV dilaudid 0.5-1mg  q4 hours - He is npo for procedure today but then will advance diet as tolerated, although patient denies appetite - Increasing NS to 125/h mL/hr - Continue Senokot and Miralax daily for constipation - CMP daily  # Severe AKI on possible CKD, likely 2/2 ATN Creatinine increased acutely from 1.43 on admission to 5.17 >> 5.58>>6.8 today. Likely due to ATN in the setting of rapid correction of HTN as well as contrast from CTA chest. Multifactorial.   US kidney unremarkable. Nephro saw the pt. Thinks it is a multifactorial ATN (normotensive HTN, Dual RAAS block, contrast induced) and may need temp HD this admission.  -He is NPO for access placement this AM but patient declined the procedure today. I talked to nephrology about patient is not agreeable with access placement and wants to go home. Nephro will see him soon and hopefully. -All risks of leaving hospital explained to patient. - nephrology recommended BP goal of 170-180. -Increase NS to 125 ml/h - strict in and out - unfortunately urine did not measure. Per RN he had some urine. Post void? bladder scan showed 0 ml of urine. Asked RN to document I/Os. - f/u labs: urine  sodium, urine protein:creatinine ratio  - CMp daily -UDS positive for cocaine as well as opioid -Appreciate nephro rec  # Severe Asymptomatic Hypertension Blood pressures was initially in 200's systolic in ED, although rapidly decreased s/p Nitro gtt, (more than the goal) amlodipine and Losartan on admission.  - Will hold amlodipine 10mg  and losartan 50mg  for now given rapid decrease in BP  - Increase NS 125 mL/hr as BP goal is 170/180 per nephro. Has has good urine out put. - Will continue to monitor closely   # OSA vs. OHS w/ Polycythemia   - Continue CPAP nightly   Code  Status: Full code Diet: Low fat diet as tolerated IVF: NS 125 mL/hr DVT PPx: Lovenox  Prior to Admission Living Arrangement: Home Anticipated Discharge Location: Home Barriers to Discharge: Not tolerating PO   ADDENDUM: Per RN note, unfortunately patient left against medical advice. He had full capacity to decide.  , MD 04/19/2020, 6:04 AM Pager: [phone] After 5pm on weekdays and 1pm on weekends: On Call pager [phone]

## 2020-04-19 NOTE — ED Triage Notes (Addendum)
Patient arrived by EMS. A&Ox4 . Patient was admitted to [facility] and stayed 4days before leaving AMA. He originally went to the hospital for constipation and difficultly urinating. EMS arrived to the  patients residence and found patient smoking a cigarette and complaining of SOB.

## 2020-04-19 NOTE — Progress Notes (Signed)
Patient was given [facility] paperwork as requested.  Nephrology has spoken with patient to explain severity of condition, patient still asked for [facility] paperwork.. AMA papers have been signed and placed in chart.  Patient walking down hallway with gown and no shoes.  This nurse asked patient to please put a shirt on and shoes before he leaves.  Walked with patient down hallway to show him a bathroom to change and he was extremely out of breath with no oxygen(patient was on 3 liters prior to leaving).  I asked multiple times if he wanted to stay to get proper treatment and he still stated "I am going home".  Charge nurse Josh also spoke with him in regards to severity of his condition and he still continued to walk down the hallway.  Charge nurse walked with him down to the ED entrance to make sure he got there safely since he was so short of breath.

## 2020-04-19 NOTE — Progress Notes (Signed)
0400 After pt came from the bathroom he was asked if he urinated.  And/or  if he urinated the previous times when he was assisted to the bathroom. The pt said that he did not urinate but had a little trickle of urine the last time that he went but passed a lot of gas.A bladder scan was done x 3 scans which showed no urine accumulation in the bladder. Pt said he does not have the urge to urinate at this time but said that he urinated while in the ED 3 times before he came to the floor and that it was not measured.

## 2020-04-20 ENCOUNTER — Emergency Department (HOSPITAL_COMMUNITY): Payer: BLUE CROSS/BLUE SHIELD

## 2020-04-20 DIAGNOSIS — N179 Acute kidney failure, unspecified: Secondary | ICD-10-CM

## 2020-04-20 DIAGNOSIS — F1490 Cocaine use, unspecified, uncomplicated: Secondary | ICD-10-CM

## 2020-04-20 DIAGNOSIS — K859 Acute pancreatitis without necrosis or infection, unspecified: Secondary | ICD-10-CM

## 2020-04-20 DIAGNOSIS — I10 Essential (primary) hypertension: Secondary | ICD-10-CM

## 2020-04-20 HISTORY — DX: Acute kidney failure, unspecified: N17.9

## 2020-04-20 LAB — COMPREHENSIVE METABOLIC PANEL
ALT: 26 U/L (ref 0–44)
AST: 41 U/L (ref 15–41)
Albumin: 3.3 g/dL — ABNORMAL LOW (ref 3.5–5.0)
Alkaline Phosphatase: 71 U/L (ref 38–126)
Anion gap: 18 — ABNORMAL HIGH (ref 5–15)
BUN: 108 mg/dL — ABNORMAL HIGH (ref 6–20)
CO2: 22 mmol/L (ref 22–32)
Calcium: 8.9 mg/dL (ref 8.9–10.3)
Chloride: 97 mmol/L — ABNORMAL LOW (ref 98–111)
Creatinine, Ser: 9.05 mg/dL — ABNORMAL HIGH (ref 0.61–1.24)
GFR calc Af Amer: 7 mL/min — ABNORMAL LOW (ref >60)
GFR calc non Af Amer: 6 mL/min — ABNORMAL LOW (ref >60)
Glucose, Bld: 154 mg/dL — ABNORMAL HIGH (ref 70–99)
Potassium: 4.6 mmol/L (ref 3.5–5.1)
Sodium: 137 mmol/L (ref 135–145)
Total Bilirubin: 2.3 mg/dL — ABNORMAL HIGH (ref 0.3–1.2)
Total Protein: 7.5 g/dL (ref 6.5–8.1)

## 2020-04-20 LAB — CBC
HCT: 43.1 % (ref 39.0–52.0)
Hemoglobin: 14.6 g/dL (ref 13.0–17.0)
MCH: 32.3 pg (ref 26.0–34.0)
MCHC: 33.9 g/dL (ref 30.0–36.0)
MCV: 95.4 fL (ref 80.0–100.0)
Platelets: 195 10*3/uL (ref 150–400)
RBC: 4.52 MIL/uL (ref 4.22–5.81)
RDW: 14 % (ref 11.5–15.5)
WBC: 15 10*3/uL — ABNORMAL HIGH (ref 4.0–10.5)
nRBC: 0 % (ref 0.0–0.2)

## 2020-04-20 LAB — RAPID URINE DRUG SCREEN, HOSP PERFORMED
Amphetamines: NOT DETECTED
Barbiturates: NOT DETECTED
Benzodiazepines: NOT DETECTED
Cocaine: POSITIVE — AB
Opiates: POSITIVE — AB
Tetrahydrocannabinol: NOT DETECTED

## 2020-04-20 LAB — LIPID PANEL
Cholesterol: 149 mg/dL (ref 0–200)
HDL: 17 mg/dL — ABNORMAL LOW (ref >40)
LDL Cholesterol: 87 mg/dL (ref 0–99)
Total CHOL/HDL Ratio: 8.8 RATIO
Triglycerides: 224 mg/dL — ABNORMAL HIGH (ref <150)
VLDL: 45 mg/dL — ABNORMAL HIGH (ref 0–40)

## 2020-04-20 LAB — CBC WITH DIFFERENTIAL/PLATELET
Abs Immature Granulocytes: 0.05 10*3/uL (ref 0.00–0.07)
Basophils Absolute: 0 10*3/uL (ref 0.0–0.1)
Basophils Relative: 0 %
Eosinophils Absolute: 0 10*3/uL (ref 0.0–0.5)
Eosinophils Relative: 0 %
HCT: 43.6 % (ref 39.0–52.0)
Hemoglobin: 15 g/dL (ref 13.0–17.0)
Immature Granulocytes: 0 %
Lymphocytes Relative: 9 %
Lymphs Abs: 1.4 10*3/uL (ref 0.7–4.0)
MCH: 32.8 pg (ref 26.0–34.0)
MCHC: 34.4 g/dL (ref 30.0–36.0)
MCV: 95.2 fL (ref 80.0–100.0)
Monocytes Absolute: 2.5 10*3/uL — ABNORMAL HIGH (ref 0.1–1.0)
Monocytes Relative: 16 %
Neutro Abs: 11.8 10*3/uL — ABNORMAL HIGH (ref 1.7–7.7)
Neutrophils Relative %: 75 %
Platelets: 185 10*3/uL (ref 150–400)
RBC: 4.58 MIL/uL (ref 4.22–5.81)
RDW: 13.9 % (ref 11.5–15.5)
WBC: 15.7 10*3/uL — ABNORMAL HIGH (ref 4.0–10.5)
nRBC: 0 % (ref 0.0–0.2)

## 2020-04-20 LAB — BILIRUBIN, FRACTIONATED(TOT/DIR/INDIR)
Bilirubin, Direct: 1.3 mg/dL — ABNORMAL HIGH (ref 0.0–0.2)
Indirect Bilirubin: 0.1 mg/dL — ABNORMAL LOW (ref 0.3–0.9)
Total Bilirubin: 1.4 mg/dL — ABNORMAL HIGH (ref 0.3–1.2)

## 2020-04-20 LAB — SARS CORONAVIRUS 2 BY RT PCR (HOSPITAL ORDER, PERFORMED IN ~~LOC~~ HOSPITAL LAB): SARS Coronavirus 2: NEGATIVE

## 2020-04-20 LAB — CREATININE, SERUM
Creatinine, Ser: 9.43 mg/dL — ABNORMAL HIGH (ref 0.61–1.24)
GFR calc Af Amer: 6 mL/min — ABNORMAL LOW (ref >60)
GFR calc non Af Amer: 5 mL/min — ABNORMAL LOW (ref >60)

## 2020-04-20 LAB — LIPASE, BLOOD: Lipase: 63 U/L — ABNORMAL HIGH (ref 11–51)

## 2020-04-20 LAB — LACTIC ACID, PLASMA: Lactic Acid, Venous: 1 mmol/L (ref 0.5–1.9)

## 2020-04-20 MED ORDER — FENTANYL CITRATE (PF) 100 MCG/2ML IJ SOLN
100.0000 ug | Freq: Once | INTRAMUSCULAR | Status: AC
  Administered 2020-04-20: 100 ug via INTRAVENOUS
  Filled 2020-04-20: qty 2

## 2020-04-20 MED ORDER — HYDROMORPHONE HCL 1 MG/ML IJ SOLN
0.5000 mg | INTRAMUSCULAR | Status: DC | PRN
  Administered 2020-04-20 – 2020-04-22 (×9): 0.5 mg via INTRAVENOUS
  Filled 2020-04-20 (×9): qty 1

## 2020-04-20 MED ORDER — AMLODIPINE BESYLATE 10 MG PO TABS
10.0000 mg | ORAL_TABLET | Freq: Every day | ORAL | Status: DC
  Administered 2020-04-20 – 2020-04-25 (×6): 10 mg via ORAL
  Filled 2020-04-20: qty 2
  Filled 2020-04-20: qty 1
  Filled 2020-04-20: qty 2
  Filled 2020-04-20 (×3): qty 1

## 2020-04-20 MED ORDER — SODIUM CHLORIDE 0.9% FLUSH
3.0000 mL | Freq: Two times a day (BID) | INTRAVENOUS | Status: DC
  Administered 2020-04-20 – 2020-04-24 (×8): 3 mL via INTRAVENOUS

## 2020-04-20 MED ORDER — LISINOPRIL 10 MG PO TABS
10.0000 mg | ORAL_TABLET | Freq: Every day | ORAL | Status: DC
  Administered 2020-04-20: 10 mg via ORAL
  Filled 2020-04-20: qty 1

## 2020-04-20 MED ORDER — HYDRALAZINE HCL 20 MG/ML IJ SOLN
5.0000 mg | Freq: Three times a day (TID) | INTRAMUSCULAR | Status: DC | PRN
  Administered 2020-04-20: 5 mg via INTRAVENOUS
  Filled 2020-04-20: qty 1

## 2020-04-20 MED ORDER — HEPARIN SODIUM (PORCINE) 5000 UNIT/ML IJ SOLN
5000.0000 [IU] | Freq: Three times a day (TID) | INTRAMUSCULAR | Status: DC
  Administered 2020-04-20 – 2020-04-25 (×14): 5000 [IU] via SUBCUTANEOUS
  Filled 2020-04-20 (×14): qty 1

## 2020-04-20 NOTE — ED Provider Notes (Signed)
[facility]-EMERGENCY DEPT Provider Note   CSN: [id number] Arrival date & time: 04/19/20  2340     History Chief Complaint  Patient presents with  . Shortness of Breath    Ricky Boyle is a 59 y.o. male.  The history is provided by the patient.  Shortness of Breath Severity:  Moderate Onset quality:  Gradual Timing:  Intermittent Progression:  Unchanged Chronicity:  New Relieved by:  Nothing Worsened by:  Nothing Associated symptoms: abdominal pain   Associated symptoms: no chest pain, no fever, no headaches and no vomiting   Patient with history of hypertension presents with shortness of breath. Patient reports he was just [facility] after leaving AGAINST MEDICAL ADVICE. He reports he became concerned when he was told that he may need to be on dialysis He reports he left the hospital went home and became short of breath. He also reports ongoing abdominal pain and shortness of breath  Per EMS, patient was found outside smoking a cigarette     Past Medical History:  Diagnosis Date  . Hypertension     Patient Active Problem List   Diagnosis Date Noted  . Acute kidney injury (HCC) 04/18/2020  . Acute pancreatitis 04/17/2020  . Asymptomatic hypertensive urgency 04/17/2020  . Cocaine use 04/17/2020    No past surgical history on file.     No family history on file.  Social History   Tobacco Use  . Smoking status: Current Every Day Smoker    Packs/day: 0.50    Years: 25.00    Pack years: 12.50    Types: Cigarettes  . Smokeless tobacco: Never Used  Substance Use Topics  . Alcohol use: No  . Drug use: No    Home Medications Prior to Admission medications   Medication Sig Start Date End Date Taking? Authorizing Provider  amLODipine (NORVASC) 10 MG tablet Take 1 tablet (10 mg total) by mouth daily. 01/11/20 04/16/20  Curatolo, Adam, DO  amLODipine-benazepril (LOTREL) 10-20 MG capsule Take 1 capsule by mouth  daily. Patient not taking: Reported on 04/16/2020 05/26/16   Loren RacerYelverton, David, MD  hydrochlorothiazide (HYDRODIURIL) 25 MG tablet Take 1 tablet (25 mg total) by mouth daily. Patient not taking: Reported on 04/16/2020 06/07/18   Gerhard MunchLockwood, Robert, MD  lisinopril (PRINIVIL,ZESTRIL) 10 MG tablet Take 1 tablet (10 mg total) by mouth daily. Patient not taking: Reported on 04/16/2020 06/07/18   Gerhard MunchLockwood, Robert, MD    Allergies    Bee venom and Shrimp [shellfish allergy]  Review of Systems   Review of Systems  Constitutional: Negative for fever.  Respiratory: Positive for shortness of breath.   Cardiovascular: Negative for chest pain.  Gastrointestinal: Positive for abdominal pain and constipation. Negative for vomiting.  Neurological: Negative for headaches.  All other systems reviewed and are negative.   Physical Exam Updated Vital Signs There were no vitals taken for this visit.  Physical Exam CONSTITUTIONAL: Well developed/well nourished HEAD: Normocephalic/atraumatic EYES: EOMI/PERRL NECK: supple no meningeal signs SPINE/BACK:entire spine nontender CV: S1/S2 noted, no murmurs/rubs/gallops noted LUNGS: Tachypnea, decreased breath sounds bilaterally ABDOMEN: soft, mild epigastric tenderness, no rebound or guarding, bowel sounds noted throughout abdomen, obese GU:no cva tenderness NEURO: Pt is awake/alert/appropriate, moves all extremitiesx4.  No facial droop.   EXTREMITIES: pulses normal/equal, full ROM SKIN: warm, color normal PSYCH: no abnormalities of mood noted, alert and oriented to situation  ED Results / Procedures / Treatments   Labs (all labs ordered are listed, but only abnormal results  are displayed) Labs Reviewed  CBC WITH DIFFERENTIAL/PLATELET - Abnormal; Notable for the following components:      Result Value   WBC 15.7 (*)    Neutro Abs 11.8 (*)    Monocytes Absolute 2.5 (*)    All other components within normal limits  RAPID URINE DRUG SCREEN, HOSP PERFORMED -  Abnormal; Notable for the following components:   Opiates POSITIVE (*)    Cocaine POSITIVE (*)    All other components within normal limits  COMPREHENSIVE METABOLIC PANEL - Abnormal; Notable for the following components:   Chloride 97 (*)    Glucose, Bld 154 (*)    BUN 108 (*)    Creatinine, Ser 9.05 (*)    Albumin 3.3 (*)    Total Bilirubin 2.3 (*)    GFR calc non Af Amer 6 (*)    GFR calc Af Amer 7 (*)    Anion gap 18 (*)    All other components within normal limits  LIPASE, BLOOD - Abnormal; Notable for the following components:   Lipase 63 (*)    All other components within normal limits  SARS CORONAVIRUS 2 BY RT PCR [facility] ORDER, PERFORMED IN [facility] LAB)    EKG EKG Interpretation  Date/Time:  Sunday April 20 2020 00:52:41 EDT Ventricular Rate:  113 PR Interval:    QRS Duration: 82 QT Interval:  307 QTC Calculation: 421 R Axis:   25 Text Interpretation: Sinus tachycardia Biatrial enlargement No significant change since last tracing Confirmed by Zadie Rhine ([id number]) on 04/20/2020 1:00:05 AM   Radiology US Abdomen Complete  Result Date: 04/18/2020 CLINICAL DATA:  Pancreatitis. Elevated total bilirubin. Kidney injury. EXAM: ABDOMEN ULTRASOUND COMPLETE COMPARISON:  04/16/2020, limited abdomen ultrasound. CT, 04/16/2020. FINDINGS: Gallbladder: No gallstones or wall thickening visualized. No sonographic Murphy sign noted by sonographer. Common bile duct: Diameter: 3 mm Liver: Bowel gas partly obscures the left lobe. Generalized increased echogenicity. Area of relative decreased echogenicity in the right lobe adjacent to the gallbladder, which may reflect focal fatty sparing, measuring 2.3 x 1.9 x 2.4 cm. No other visualized liver mass or lesion. Portal vein is patent on color Doppler imaging with normal direction of blood flow towards the liver. IVC: Not well visualized.  No gross abnormality. Pancreas: Obscured by midline bowel gas. Spleen: Size and  appearance within normal limits. Right Kidney: Length: 11.0 cm. Echogenicity within normal limits. No mass or hydronephrosis visualized. Left Kidney: Length: 13.0 cm. Echogenicity within normal limits. No mass or hydronephrosis visualized. Abdominal aorta: Seen in the mid to distal portion. Proximal obscured by bowel gas. No aneurysm. Other findings: None. IMPRESSION: 1. No acute findings. 2. Normal gallbladder.  No bile duct dilation. 3. Liver appearance with increased echogenicity suggests hepatic steatosis. Focal lesion right lobe adjacent to the gallbladder is likely focal fatty sparing. 4. Study somewhat limited by midline bowel gas. Pancreas not visualized. 5. Normal kidneys. Electronically Signed   By: Amie Portland M.D.   On: 04/18/2020 12:13   DG Chest Port 1 View  Result Date: 04/20/2020 CLINICAL DATA:  Constipation and difficulty urinating. EXAM: PORTABLE CHEST 1 VIEW COMPARISON:  None. FINDINGS: Mild atelectasis and/or early infiltrate is seen along the infrahilar regions, bilaterally. There is no evidence of a pleural effusion or pneumothorax. The cardiac silhouette is mildly enlarged. The visualized skeletal structures are unremarkable. IMPRESSION: Mild bilateral infrahilar atelectasis and/or early infiltrate. Electronically Signed   By: Aram Candela M.D.   On: 04/20/2020 00:31   VAS US  RENAL ARTERY DUPLEX  Result Date: 04/18/2020 ABDOMINAL VISCERAL Indications: Acute pancreatitis, asymptomatic hypertensive urgency, and acute              kidney injury. High Risk Factors: Hypertension, hyperlipidemia, current smoker. Limitations: Obesity, patient discomfort and air/bowel gas. Comparison Study: No prior. Performing Technologist: Marilynne Halsted RDMS, RVT  Examination Guidelines: A complete evaluation includes B-mode imaging, spectral Doppler, color Doppler, and power Doppler as needed of all accessible portions of each vessel. Bilateral testing is considered an integral part of a complete  examination. Limited examinations for reoccurring indications may be performed as noted.  Duplex Findings: +------------------+--------+--------+--------------+ Right Renal ArteryPSV cm/sEDV cm/s   Comment     +------------------+--------+--------+--------------+ Origin                            not visualized +------------------+--------+--------+--------------+ Proximal                          not visualized +------------------+--------+--------+--------------+ Mid                               not visualized +------------------+--------+--------+--------------+ Distal               62      15       Patent     +------------------+--------+--------+--------------+ +-----------------+--------+--------+--------------+ Left Renal ArteryPSV cm/sEDV cm/s   Comment     +-----------------+--------+--------+--------------+ Origin                           not visualized +-----------------+--------+--------+--------------+ Proximal                         not visualized +-----------------+--------+--------+--------------+ Mid                              not visualized +-----------------+--------+--------+--------------+ Distal              87      29       Patent     +-----------------+--------+--------+--------------+  Technologist observations: Tenically difficult study due to patient's body habitus. Indication for the exam - Limited renal artery study to evaluate the kidneys for perfusion.   Summary: Renal:  Right: Renal artery appears patent at the distal segment with        evidence of perfusion noted within the kidney with color flow        imaging. Left:  Renal artery appears patent at the distal segment with        evidence of perfusion noted within the kidney with color flow        imaging.  *See table(s) above for measurements and observations.  Diagnosing physician: Lemar Livings MD  Electronically signed by Lemar Livings MD on 04/18/2020 at 3:40:45 PM.    Final      Procedures Procedures   Medications Ordered in ED Medications  fentaNYL (SUBLIMAZE) injection 100 mcg (100 mcg Intravenous Given 04/20/20 0255)    ED Course  I have reviewed the triage vital signs and the nursing notes.  Pertinent labs & imaging results that were available during my care of the patient were reviewed by me and considered in my medical decision making (see chart for details).    MDM Rules/Calculators/A&P  12:42 AM Patient presents after he left [facility] AGAINST MEDICAL ADVICE. Patient reports that he does want to be on dialysis so he left the hospital. He now reports ongoing shortness of breath and continued abdominal pain. Patient was admitted for hypertensive emergency, acute pancreatitis, acute renal failure. I see no mention of imminent dialysis in the records. Will Obtain x-ray, labs and reassess. 4:17 AM Patient has worsening renal failure.  No hyperkalemia.  Patient did report some of his recurrent abdominal pain from pancreatitis Creatinine has increased from 6 to 9. Patient is now agreeable to be admitted Patient was just on the internal medicine service at [facility].  I discussed the case with the on-call resident Dr. Sande Brothers.  Patient will be admitted back to their service for further evaluation for renal failure. Final Clinical Impression(s) / ED Diagnoses Final diagnoses:  AKI (acute kidney injury) [facility])    Rx / DC Orders ED Discharge Orders    None       Zadie Rhine, MD 04/20/20 [phone]

## 2020-04-20 NOTE — ED Notes (Signed)
Pt. has ripped everything off including EKG leads and bp cuff

## 2020-04-20 NOTE — ED Notes (Signed)
Pt has been washed up,  and changed to a hospital bed.

## 2020-04-20 NOTE — H&P (Signed)
History and Physical        Hospital Admission Note Date: 04/20/2020  Patient name: Ricky Boyle Medical record number: [id number] Date of birth: 1960/08/08 Age: 59 y.o. Gender: male  PCP: Patient, No Pcp Per  Patient coming from: Home   Chief Complaint    Chief Complaint  Patient presents with  . Shortness of Breath      HPI:   This is a 59 year old male with a past medical history of hypertension, cocaine abuse who presented [facility] ED with complaints of abdominal pain for 6 days.    He was recently admitted to the internal medicine teaching service at [facility] on 04/16/2020 with abdominal pain, headache, shortness of breath without associated nausea or vomiting and was found to have pancreatitis based on elevated amylase (590) and hypertensive emergency (BP peaked around 258/158).  He received a CTA chest/abdomen/pelvis at the time which ruled out acute aortic syndrome.  He developed an AKI and nephrology was consulted.  Renal ultrasound was unremarkable.  Renal artery duplex was obtained and unremarkable.  He was noted to have been off of his blood pressure medications for several weeks and had been taking his sisters lisinopril in addition to Aleve occasionally.  His creatinine increased to 6.83 on 9/18.  There was discussion of potentially starting temporary dialysis while hospitalized however the patient became defensive and [facility].  [facility] with persistent epigastric abdominal pain without nausea or vomiting but does have associated diarrhea.  No other symptoms.  States he went home yesterday and smoked a cigarette.  When asked about his cocaine use he said "I am good now." When asked what this means, he did not clarify whether or not he recently took cocaine.  UDS positive for cocaine and opiates.  He is tolerating his diet.   ED Course:  Tachycardic, tachypneic, hypertensive 158/105-> 125/84.  Notable labs: K4.6, bicarb 22, glucose 154, BUN 108, creatinine 9.05 (yesterday: BUN 79, Cr 6.83), AG 18, lipase 63, T bili 2.3, WBC 15.7 (previously 17.7), UDS positive for opiates and cocaine.  COVID-19 negative.  CXR with mild bibasilar atelectasis and/or early infiltrate.  He was given Dilaudid 0.5 mg x 1 and lisinopril 10 mg x 1 in the ED plan was initially to [facility] however no beds are available and subsequently patient is being [facility] until he is able to be transferred.  Vitals:   04/20/20 0654 04/20/20 1004  BP: (!) 157/121 125/84  Pulse: (!) 103 100  Resp: 20 18  Temp:    SpO2: 97% 97%     Review of Systems:  Review of Systems  Constitutional: Negative for chills and fever.  Eyes: Negative for photophobia.  Respiratory: Negative for cough and shortness of breath.   Cardiovascular: Negative for chest pain and palpitations.  Gastrointestinal: Positive for abdominal pain and diarrhea. Negative for nausea and vomiting.  Genitourinary: Negative for dysuria and hematuria.  Musculoskeletal: Negative for myalgias.  Neurological: Negative for weakness.  All other systems reviewed and are negative.   Medical/Social/Family History   Past Medical History: Past Medical History:  Diagnosis Date  . Hypertension  No past surgical history on file.  Medications: Prior to Admission medications   Medication Sig Start Date End Date Taking? Authorizing Provider  amLODipine (NORVASC) 10 MG tablet Take 1 tablet (10 mg total) by mouth daily. 01/11/20 04/20/20 Yes Curatolo, Adam, DO  hydrochlorothiazide (HYDRODIURIL) 25 MG tablet Take 1 tablet (25 mg total) by mouth daily. 06/07/18  Yes Gerhard Munch, MD  amLODipine-benazepril (LOTREL) 10-20 MG capsule Take 1 capsule by mouth daily. Patient not taking: Reported on 04/16/2020 05/26/16   Loren Racer, MD  lisinopril (PRINIVIL,ZESTRIL) 10 MG  tablet Take 1 tablet (10 mg total) by mouth daily. Patient not taking: Reported on 04/16/2020 06/07/18   Gerhard Munch, MD    Allergies:   Allergies  Allergen Reactions  . Bee Venom Anaphylaxis  . Shrimp [Shellfish Allergy] Anaphylaxis    Social History:  reports that he has been smoking cigarettes. He has a 12.50 pack-year smoking history. He has never used smokeless tobacco. He reports that he does not drink alcohol and does not use drugs.  Family History: No family history on file.   Objective   Physical Exam: Blood pressure 125/84, pulse 100, temperature 98.7 F (37.1 C), temperature source Oral, resp. rate 18, SpO2 97 %.  Physical Exam Vitals and nursing note reviewed.  Constitutional:      Appearance: Normal appearance. He is obese.     Comments: Appears uncomfortable  HENT:     Head: Normocephalic and atraumatic.  Eyes:     Conjunctiva/sclera: Conjunctivae normal.  Cardiovascular:     Rate and Rhythm: Normal rate and regular rhythm.  Pulmonary:     Effort: Pulmonary effort is normal.     Breath sounds: Normal breath sounds.  Abdominal:     General: Abdomen is flat.     Palpations: Abdomen is soft.     Tenderness: There is abdominal tenderness in the epigastric area.  Musculoskeletal:        General: No swelling or tenderness.  Skin:    Coloration: Skin is not jaundiced or pale.  Neurological:     Mental Status: He is alert. Mental status is at baseline.  Psychiatric:        Mood and Affect: Mood normal.        Behavior: Behavior normal.     LABS on Admission: I have personally reviewed all the labs and imaging below    Basic Metabolic Panel: Recent Labs  Lab 04/17/20 0305 04/18/20 0807 04/19/20 0102 04/19/20 0102 04/20/20 0200 04/20/20 1232  NA 142   < > 135  --  137  --   K 4.8   < > 5.1  --  4.6  --   CL 103   < > 100  --  97*  --   CO2 27   < > 20*  --  22  --   GLUCOSE 149*   < > 139*  --  154*  --   BUN 19   < > 79*  --  108*  --    CREATININE 1.43*   < > 6.83*   < > 9.05* 9.43*  CALCIUM 9.9   < > 8.7*  --  8.9  --   MG 2.2  --   --   --   --   --   PHOS 4.9*  --  4.2  --   --   --    < > = values in this interval not displayed.   Liver Function Tests: Recent Labs  Lab 04/18/20 1240 04/18/20 1240 04/19/20 0102 04/20/20 0200  AST 74*  --   --  41  ALT 27  --   --  26  ALKPHOS 66  --   --  71  BILITOT 2.0*  --   --  2.3*  PROT 6.9  --   --  7.5  ALBUMIN 3.3*   < > 2.9* 3.3*   < > = values in this interval not displayed.   Recent Labs  Lab 04/16/20 1105 04/16/20 2130 04/20/20 0200  LIPASE 33  --  63*  AMYLASE  --  590*  --    No results for input(s): AMMONIA in the last 168 hours. CBC: Recent Labs  Lab 04/20/20 0200 04/20/20 0200 04/20/20 1232  WBC 15.7*  --  15.0*  NEUTROABS 11.8*  --   --   HGB 15.0  --  14.6  HCT 43.6  --  43.1  MCV 95.2   < > 95.4  PLT 185  --  195   < > = values in this interval not displayed.   Cardiac Enzymes: No results for input(s): CKTOTAL, CKMB, CKMBINDEX, TROPONINI in the last 168 hours. BNP: Invalid input(s): POCBNP CBG: Recent Labs  Lab 04/18/20 2356 04/19/20 0748  GLUCAP 150* 151*    Radiological Exams on Admission:  DG Chest Port 1 View  Result Date: 04/20/2020 CLINICAL DATA:  Constipation and difficulty urinating. EXAM: PORTABLE CHEST 1 VIEW COMPARISON:  None. FINDINGS: Mild atelectasis and/or early infiltrate is seen along the infrahilar regions, bilaterally. There is no evidence of a pleural effusion or pneumothorax. The cardiac silhouette is mildly enlarged. The visualized skeletal structures are unremarkable. IMPRESSION: Mild bilateral infrahilar atelectasis and/or early infiltrate. Electronically Signed   By: Aram Candela M.D.   On: 04/20/2020 00:31      EKG: Independently reviewed.  At baseline   A & P   Principal Problem:   Acute kidney injury (HCC) Active Problems:   Acute pancreatitis   Cocaine use   Essential  hypertension   1. AKI: Multifactorial-hemodynamic changes from hypertensive emergency, lisinopril and NSAID use, contrast from CT scan a. Afebrile and hemodynamically stable, urinating, currently normotensive but did receive lisinopril this a.m. b. He is now agreeable to starting HD if needed  c. Strict I's/O and daily weights d. N.p.o. after midnight in the event he needs access to start HD e. No NSAIDs f. Holding lisinopril g. Renal diet h. Nephrology consulted, discussed with Dr. Glenna Fellows, recommended against IV fluids i. Order placed to transfer back to [facility] when bed is available  2. Acute pancreatitis, unclear etiology (possibly cocaine induced vs. ?HCTZ use vs. Other) a. Lipase 33-> 63, amylase 590 on 9/15 b. RUQ [place] from 9/15 unremarkable c. Will check triglycerides and ethanol d. Tolerating diet, holding IV fluids as above e. Dilaudid 0.5 mg every 4 hours as needed for severe pain  3. Hypertensive emergency, currently normotensive a. BP up to 250/150 several days ago at [facility] prior to leaving AMA and had received beta-blockers in the ED (in the setting of cocaine use) with renal failure b. Hold lisinopril, Hold beta-blockers in the setting of cocaine use c. Hydralazine as needed d. Need to sort out his antihypertensives and needs a PCP prior to discharge  4. Cocaine abuse a. Encourage cessation  5. Increased Anion Gap, likely from uremia a. AG 18, BUN 108 b. Will check a lactic acid    DVT prophylaxis: Heparin   Code Status: Full Code  Diet: renal Family Communication: Admission, patients condition and plan of care including tests being ordered have been discussed with the patient who indicates understanding and agrees with the plan and Code Status.  Disposition Plan: The appropriate patient status for this patient is INPATIENT. Inpatient status is judged to be reasonable and necessary in order to provide the required intensity of service to ensure the patient's safety. The  patient's presenting symptoms, physical exam findings, and initial radiographic and laboratory data in the context of their chronic comorbidities is felt to place them at high risk for further clinical deterioration. Furthermore, it is not anticipated that the patient will be medically stable for discharge from the hospital within 2 midnights of admission. The following factors support the patient status of inpatient.   " The patient's presenting symptoms include abdominal pain. " The worrisome physical exam findings include abdominal pain " The initial radiographic and laboratory data are worrisome because of BUN 108, Cr 9.05. " The chronic co-morbidities include cocaine use, hypertension.   * I certify that at the point of admission it is my clinical judgment that the patient will require inpatient hospital care spanning beyond 2 midnights from the point of admission due to high intensity of service, high risk for further deterioration and high frequency of surveillance required.*   Status is: Inpatient  Remains inpatient appropriate because:Ongoing active pain requiring inpatient pain management, Ongoing diagnostic testing needed not appropriate for outpatient work up, IV treatments appropriate due to intensity of illness or inability to take PO and Inpatient level of care appropriate due to severity of illness   Dispo: The patient is from: Home              Anticipated d/c is to: Home              Anticipated d/c date is: 3 days              Patient currently is not medically stable to d/c.        The medical decision making on this patient was of high complexity and the patient is at high risk for clinical deterioration, therefore this is a level 3 admission.  Consultants  . nephrology  Procedures  . none  Time Spent on Admission: 75 minutes    Jae Dire, DO [facility] Pager [phone] 04/20/2020, 1:03 PM

## 2020-04-20 NOTE — Progress Notes (Signed)
[facility] Progress Note   Subjective/interval events:   Pt left [facility] AMA yesterday but then presented to [facility] around MN for dyspnea.  Creatinine up to 9, K 4.6, bicarb 22.  Agreeable to readmission.  Plan to transfer to [facility] when bed available given will likely need dialysis.   He says he got home and started thinking about our conversation in the AM and decided to come back.  He is apologetic.    Background:  HPI:  Ricky Boyle is a 59 y.o. male with a past medical history of hypertension who presented to the hospital with "pain in my stomach" and a headache.  He is also has some shortness of breath but no nausea or vomiting.  He was found to have pancreatitis and HTN emergency.  He received a CTA chest/abd/pelvis yesterday which ruled out any acute aortic syndrome.  Of note, the patient's blood pressure was markedly elevated on presentation at 200/110 (peak around 258/158) and reached a nadir of 110 - 130's systolic.  Nephrology is consulted for acute kidney injury.  On US kidneys without mass or hydro.  Noted a renal artery duplex was obtained and renal artery appears patient.   No urine output is documented but he tells me that he has been urinating without difficulty and strict ins and outs are ordered.  With regard to medications he does not recognize HCTZ.  He states that is normally on lisinopril and amlodipine but has been out of medication for a couple of weeks and has been taking his sister's medication.  He uses Aleve a couple of times a month  Objective Vitals:   04/20/20 0346 04/20/20 0654 04/20/20 1004 04/20/20 1312  BP: (!) 162/94 (!) 157/121 125/84 (!) 164/95  Pulse: (!) 111 (!) 103 100 (!) 104  Resp: 20 20 18 20   Temp:      TempSrc:      SpO2: 94% 97% 97% 95%   Physical Exam General: obese, nontoxic Heart: tachycardic, regular Lungs: normal WOB on RA Abdomen: obese Extremities: no edema GU: no foley but ~758mL urine in urinal jug  Additional  Objective Labs: Basic Metabolic Panel: Recent Labs  Lab 04/17/20 0305 04/18/20 0807 04/18/20 1240 04/18/20 1240 04/19/20 0102 04/20/20 0200 04/20/20 1232  NA 142   < > 135  --  135 137  --   K 4.8   < > 5.1  --  5.1 4.6  --   CL 103   < > 101  --  100 97*  --   CO2 27   < > 18*  --  20* 22  --   GLUCOSE 149*   < > 162*  --  139* 154*  --   BUN 19   < > 66*  --  79* 108*  --   CREATININE 1.43*   < > 5.58*   < > 6.83* 9.05* 9.43*  CALCIUM 9.9   < > 8.9  --  8.7* 8.9  --   PHOS 4.9*  --   --   --  4.2  --   --    < > = values in this interval not displayed.   Liver Function Tests: Recent Labs  Lab 04/18/20 0807 04/18/20 0807 04/18/20 1240 04/19/20 0102 04/20/20 0200 04/20/20 1232  AST 75*  --  74*  --  41  --   ALT 27  --  27  --  26  --   ALKPHOS 67  --  66  --  71  --   BILITOT 2.2*   < > 2.0*  --  2.3* 1.4*  PROT 7.3  --  6.9  --  7.5  --   ALBUMIN 3.6   < > 3.3* 2.9* 3.3*  --    < > = values in this interval not displayed.   Recent Labs  Lab 04/16/20 1105 04/16/20 2130 04/20/20 0200  LIPASE 33  --  63*  AMYLASE  --  590*  --    CBC: Recent Labs  Lab 04/17/20 0305 04/17/20 0305 04/18/20 0807 04/18/20 0807 04/18/20 1240 04/20/20 0200 04/20/20 1232  WBC 13.2*   < > 17.8*   < > 17.7* 15.7* 15.0*  NEUTROABS  --   --   --   --   --  11.8*  --   HGB 20.0*   < > 18.2*   < > 17.0 15.0 14.6  HCT 62.2*   < > 57.4*   < > 53.9* 43.6 43.1  MCV 98.1  --  99.5  --  99.6 95.2 95.4  PLT 215   < > 177   < > 154 185 195   < > = values in this interval not displayed.   Blood Culture No results found for: SDES, SPECREQUEST, CULT, REPTSTATUS  Cardiac Enzymes: No results for input(s): CKTOTAL, CKMB, CKMBINDEX, TROPONINI in the last 168 hours. CBG: Recent Labs  Lab 04/17/20 1619 04/17/20 2349 04/18/20 1726 04/18/20 2356 04/19/20 0748  GLUCAP 151* 140* 137* 150* 151*   Iron Studies: No results for input(s): IRON, TIBC, TRANSFERRIN, FERRITIN in the last 72  hours. @lablastinr3 @ Studies/Results: DG Chest Port 1 View  Result Date: 04/20/2020 CLINICAL DATA:  Constipation and difficulty urinating. EXAM: PORTABLE CHEST 1 VIEW COMPARISON:  None. FINDINGS: Mild atelectasis and/or early infiltrate is seen along the infrahilar regions, bilaterally. There is no evidence of a pleural effusion or pneumothorax. The cardiac silhouette is mildly enlarged. The visualized skeletal structures are unremarkable. IMPRESSION: Mild bilateral infrahilar atelectasis and/or early infiltrate. Electronically Signed   By: 04/22/2020 M.D.   On: 04/20/2020 00:31   Medications:  . amLODipine  10 mg Oral Daily  . heparin  5,000 Units Subcutaneous Q8H  . sodium chloride flush  3 mL Intravenous Q12H    Assessment/Plan:  **AKI:  Baseline 1.4 as of 9/16 now with severe AKI in setting of HTN urgency (cocaine, out of meds) + contrast.  UA with neg protein and 0-5 RBC.  No obstruction on imaging.  Cr up to 9 and he likely will need temporary HD but I'm hopeful he will recover renal function given fairly normal baseline and nonoliguric state.  Transfer to [facility] for iHD (plan for HD if labs continue to worsen and he develops indications).  Will tentatively make NPOpMN but given good UOP won't order catheter yet --> I'm hopeful that this turns around.   **HTN urgency:  +cocaine yesterday, out of meds.  Home meds have been resumed and BPs are currently acceptable.  **Pancreatitis:  Per primary.  If fluids are indicated for pancreatitis I'm ok with judicious fluids.   **Dyspnea: poss related to HTN urgency.  CXR poss early infiltrate v atelectasis; mild leukocytosis but afebrile, COVID19 neg.    **Cocaine abuse: rec cessation.   [facility] MD 04/20/2020, 2:30 PM  [facility] Pager: [phone]

## 2020-04-21 ENCOUNTER — Encounter (HOSPITAL_COMMUNITY): Payer: Self-pay | Admitting: Internal Medicine

## 2020-04-21 LAB — SODIUM, URINE, RANDOM: Sodium, Ur: 24 mmol/L

## 2020-04-21 LAB — COMPREHENSIVE METABOLIC PANEL
ALT: 27 U/L (ref 0–44)
AST: 26 U/L (ref 15–41)
Albumin: 2.9 g/dL — ABNORMAL LOW (ref 3.5–5.0)
Alkaline Phosphatase: 72 U/L (ref 38–126)
Anion gap: 14 (ref 5–15)
BUN: 110 mg/dL — ABNORMAL HIGH (ref 6–20)
CO2: 20 mmol/L — ABNORMAL LOW (ref 22–32)
Calcium: 8.2 mg/dL — ABNORMAL LOW (ref 8.9–10.3)
Chloride: 100 mmol/L (ref 98–111)
Creatinine, Ser: 8.72 mg/dL — ABNORMAL HIGH (ref 0.61–1.24)
GFR calc Af Amer: 7 mL/min — ABNORMAL LOW (ref >60)
GFR calc non Af Amer: 6 mL/min — ABNORMAL LOW (ref >60)
Glucose, Bld: 168 mg/dL — ABNORMAL HIGH (ref 70–99)
Potassium: 4.6 mmol/L (ref 3.5–5.1)
Sodium: 134 mmol/L — ABNORMAL LOW (ref 135–145)
Total Bilirubin: 1.6 mg/dL — ABNORMAL HIGH (ref 0.3–1.2)
Total Protein: 6.7 g/dL (ref 6.5–8.1)

## 2020-04-21 LAB — ETHANOL: Alcohol, Ethyl (B): <10 mg/dL (ref <10)

## 2020-04-21 LAB — CBC
HCT: 41.7 % (ref 39.0–52.0)
Hemoglobin: 13.9 g/dL (ref 13.0–17.0)
MCH: 32 pg (ref 26.0–34.0)
MCHC: 33.3 g/dL (ref 30.0–36.0)
MCV: 96.1 fL (ref 80.0–100.0)
Platelets: 201 10*3/uL (ref 150–400)
RBC: 4.34 MIL/uL (ref 4.22–5.81)
RDW: 14 % (ref 11.5–15.5)
WBC: 14.3 10*3/uL — ABNORMAL HIGH (ref 4.0–10.5)
nRBC: 0 % (ref 0.0–0.2)

## 2020-04-21 LAB — CREATININE, URINE, RANDOM: Creatinine, Urine: 125.57 mg/dL

## 2020-04-21 NOTE — ED Notes (Signed)
Pt reported thirst, discussed fluid restrictions with pt and provided 1/2 cup of water. Pt expresses understanding of fluid restrictions at this time.

## 2020-04-21 NOTE — ED Notes (Signed)
Patient refused in and out cath. Patient states he will not be in and out catheterized as he cannot handle the pain. Patient bladder scanned before voiding, and 343 ml's shown in bladder. Patient voided in a urinal and was able to void 464ml's. Patient was scanned post void and 42ml's were shown in the bladder. Patient denies feeling the urge to void more and denies feeling as if his bladder is full. MD messaged per his request with this information.

## 2020-04-21 NOTE — Progress Notes (Signed)
PROGRESS NOTE    Ricky Boyle  ZOX:[id number]  DOB: February 28, 1961  PCP: Patient, No Pcp Per Admit date:04/19/2020 Chief compliant: abdominal pain 59 year old male with a past medical history of hypertension, cocaine abuse who [facility] with complaints of abdominal pain for 6 days.  He was  admitted to the internal medicine teaching service at [facility] on 04/16/2020 with abdominal pain, headache, shortness of breath without associated nausea or vomiting and was found to have pancreatitis based on elevated amylase (590) and hypertensive emergency (BP peaked around 258/158).  He underwent CTA chest/abdomen/pelvis at the time which ruled out acute aortic syndrome. Subsequently, he developed an AKI and nephrology was consulted.  He was noted to have been off of his blood pressure medications for several weeks and had been taking his sisters lisinopril in addition to Aleve occasionally.  His creatinine increased to 6.83 on 9/18.  There was discussion of potentially starting temporary dialysis while hospitalized however the patient became defensive and [facility]. [facility] on [facility]/19 with persistent epigastric abdominal pain with associated diarrhea.  He was vague about cocaine use when asked in the ED.  Urine drug screen was positive for opiates and cocaine. ED Course: Afebrile,Tachycardic, tachypneic, hypertensive 158/105-> 125/84.  Notable labs: K4.6, bicarb 22, glucose 154, BUN 108, creatinine 9.05 (yesterday: BUN 79, Cr 6.83), AG 18, lipase 63, T bili 2.3, WBC 15.7 (previously 17.7), UDS positive for opiates and cocaine.  COVID-19 negative.  CXR with mild bibasilar atelectasis and/or early infiltrate.  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  Subjective:  Patient resting comfortably.  Noted to be on 2-1/2 L of O2 via  nasal cannula.  Denies feeling short of breath but states was hypoxic in recent hospitalization and placed on O2 as well.  He does have history of smoking.  Per RN, patient desatted to 91% on room air and placed on O2.  Objective: Vitals:   04/21/20 0800 04/21/20 0852 04/21/20 0853 04/21/20 1047  BP: (!) 145/118   (!) 143/90  Pulse:      Resp: (!) 21  (!) 21   Temp:      TempSrc:      SpO2:  91%      Intake/Output Summary (Last 24 hours) at 04/21/2020 1147 Last data filed at 04/21/2020 0412 Gross per 24 hour  Intake --  Output 1325 ml  Net -1325 ml   There were no vitals filed for this visit.  Physical Examination:  General: Heavy built, no acute distress noted Head ENT: Atraumatic normocephalic, PERRLA, neck supple Heart: S1-S2 heard, regular rate and rhythm, no murmurs.  No leg edema noted Lungs: Equal air entry bilaterally, no rhonchi or rales on exam, no accessory muscle use Abdomen: Bowel sounds heard, soft, tender in epigastrium, nondistended. No organomegaly.  No CVA tenderness Extremities: No pedal edema.  No cyanosis or clubbing. Neurological: Awake alert oriented x3, no focal weakness or numbness, strength and sensations to crude touch intact Skin: No wounds or rashes.  Data Reviewed: I have personally reviewed following labs and imaging studies  CBC: Recent Labs  Lab 04/18/20 0807 04/18/20 1240 04/20/20 0200 04/20/20 1232 04/21/20 0613  WBC 17.8* 17.7* 15.7* 15.0* 14.3*  NEUTROABS  --   --  11.8*  --   --  HGB 18.2* 17.0 15.0 14.6 13.9  HCT 57.4* 53.9* 43.6 43.1 41.7  MCV 99.5 99.6 95.2 95.4 96.1  PLT 177 154 185 195 201   Basic Metabolic Panel: Recent Labs  Lab 04/17/20 0305 04/17/20 0305 04/18/20 0807 04/18/20 0807 04/18/20 1240 04/19/20 0102 04/20/20 0200 04/20/20 1232 04/21/20 0338  NA 142   < > 136  --  135 135 137  --  134*  K 4.8   < > 5.3*  --  5.1 5.1 4.6  --  4.6  CL 103   < > 100  --  101 100 97*  --  100  CO2 27   < > 20*  --  18*  20* 22  --  20*  GLUCOSE 149*   < > 133*  --  162* 139* 154*  --  168*  BUN 19   < > 58*  --  66* 79* 108*  --  110*  CREATININE 1.43*   < > 5.17*   < > 5.58* 6.83* 9.05* 9.43* 8.72*  CALCIUM 9.9   < > 9.1  --  8.9 8.7* 8.9  --  8.2*  MG 2.2  --   --   --   --   --   --   --   --   PHOS 4.9*  --   --   --   --  4.2  --   --   --    < > = values in this interval not displayed.   GFR: Estimated Creatinine Clearance: 11.2 mL/min (A) (by C-G formula based on SCr of 8.72 mg/dL (H)). Liver Function Tests: Recent Labs  Lab 04/17/20 0305 04/17/20 0305 04/18/20 0807 04/18/20 1240 04/19/20 0102 04/20/20 0200 04/20/20 1232 04/21/20 0338  AST 34  --  75* 74*  --  41  --  26  ALT 33  --  27 27  --  26  --  27  ALKPHOS 80  --  67 66  --  71  --  72  BILITOT 0.9   < > 2.2* 2.0*  --  2.3* 1.4* 1.6*  PROT 8.8*  --  7.3 6.9  --  7.5  --  6.7  ALBUMIN 4.3   < > 3.6 3.3* 2.9* 3.3*  --  2.9*   < > = values in this interval not displayed.   Recent Labs  Lab 04/16/20 1105 04/16/20 2130 04/20/20 0200  LIPASE 33  --  63*  AMYLASE  --  590*  --    No results for input(s): AMMONIA in the last 168 hours. Coagulation Profile: No results for input(s): INR, PROTIME in the last 168 hours. Cardiac Enzymes: No results for input(s): CKTOTAL, CKMB, CKMBINDEX, TROPONINI in the last 168 hours. BNP (last 3 results) No results for input(s): PROBNP in the last 8760 hours. HbA1C: No results for input(s): HGBA1C in the last 72 hours. CBG: Recent Labs  Lab 04/17/20 1619 04/17/20 2349 04/18/20 1726 04/18/20 2356 04/19/20 0748  GLUCAP 151* 140* 137* 150* 151*   Lipid Profile: Recent Labs    04/20/20 1232  CHOL 149  HDL 17*  LDLCALC 87  TRIG 767*  CHOLHDL 8.8   Thyroid Function Tests: No results for input(s): TSH, T4TOTAL, FREET4, T3FREE, THYROIDAB in the last 72 hours. Anemia Panel: No results for input(s): VITAMINB12, FOLATE, FERRITIN, TIBC, IRON, RETICCTPCT in the last 72 hours. Sepsis  Labs: Recent Labs  Lab 04/16/20 1700 04/18/20 0807 04/20/20 1406  LATICACIDVEN  3.0* 2.8* 1.0    Recent Results (from the past 240 hour(s))  SARS Coronavirus 2 by RT PCR (hospital order, performed in [facility] hospital lab) Nasopharyngeal Nasopharyngeal Swab     Status: None   Collection Time: 04/16/20  8:41 PM   Specimen: Nasopharyngeal Swab  Result Value Ref Range Status   SARS Coronavirus 2 NEGATIVE NEGATIVE Final    Comment: (NOTE) SARS-CoV-2 target nucleic acids are NOT DETECTED.  The SARS-CoV-2 RNA is generally detectable in upper and lower respiratory specimens during the acute phase of infection. The lowest concentration of SARS-CoV-2 viral copies this assay can detect is 250 copies / mL. A negative result does not preclude SARS-CoV-2 infection and should not be used as the sole basis for treatment or other patient management decisions.  A negative result may occur with improper specimen collection / handling, submission of specimen other than nasopharyngeal swab, presence of viral mutation(s) within the areas targeted by this assay, and inadequate number of viral copies (<250 copies / mL). A negative result must be combined with clinical observations, patient history, and epidemiological information.  Fact Sheet for Patients:   BoilerBrush.com.cy  Fact Sheet for Healthcare Providers: https://pope.com/  This test is not yet approved or  cleared by the [place] FDA and has been authorized for detection and/or diagnosis of SARS-CoV-2 by FDA under an Emergency Use Authorization (EUA).  This EUA will remain in effect (meaning this test can be used) for the duration of the COVID-19 declaration under Section 564(b)(1) of the Act, 21 U.S.C. section 360bbb-3(b)(1), unless the authorization is terminated or revoked sooner.  Performed at [facility], [address] [place], [place] [address]   SARS Coronavirus 2  by RT PCR (hospital order, performed in [facility] hospital lab) Nasopharyngeal Nasopharyngeal Swab     Status: None   Collection Time: 04/20/20  2:02 AM   Specimen: Nasopharyngeal Swab  Result Value Ref Range Status   SARS Coronavirus 2 NEGATIVE NEGATIVE Final    Comment: (NOTE) SARS-CoV-2 target nucleic acids are NOT DETECTED.  The SARS-CoV-2 RNA is generally detectable in upper and lower respiratory specimens during the acute phase of infection. The lowest concentration of SARS-CoV-2 viral copies this assay can detect is 250 copies / mL. A negative result does not preclude SARS-CoV-2 infection and should not be used as the sole basis for treatment or other patient management decisions.  A negative result may occur with improper specimen collection / handling, submission of specimen other than nasopharyngeal swab, presence of viral mutation(s) within the areas targeted by this assay, and inadequate number of viral copies (<250 copies / mL). A negative result must be combined with clinical observations, patient history, and epidemiological information.  Fact Sheet for Patients:   BoilerBrush.com.cy  Fact Sheet for Healthcare Providers: https://pope.com/  This test is not yet approved or  cleared by the [place] FDA and has been authorized for detection and/or diagnosis of SARS-CoV-2 by FDA under an Emergency Use Authorization (EUA).  This EUA will remain in effect (meaning this test can be used) for the duration of the COVID-19 declaration under Section 564(b)(1) of the Act, 21 U.S.C. section 360bbb-3(b)(1), unless the authorization is terminated or revoked sooner.  Performed at [facility], [address], [place], [place] [address]       Radiology Studies: [facility] Chest Port 1 View  Result Date: 04/20/2020 CLINICAL DATA:  Constipation and difficulty urinating. EXAM: PORTABLE CHEST 1 VIEW COMPARISON:   None.  FINDINGS: Mild atelectasis and/or early infiltrate is seen along the infrahilar regions, bilaterally. There is no evidence of a pleural effusion or pneumothorax. The cardiac silhouette is mildly enlarged. The visualized skeletal structures are unremarkable. IMPRESSION: Mild bilateral infrahilar atelectasis and/or early infiltrate. Electronically Signed   By: Aram Candela M.D.   On: 04/20/2020 00:31      Scheduled Meds: . amLODipine  10 mg Oral Daily  . heparin  5,000 Units Subcutaneous Q8H  . sodium chloride flush  3 mL Intravenous Q12H   Continuous Infusions:    Assessment/Plan:  1.  Acute kidney injury: Appears to be worsening ATN in the setting of cocaine use, uncontrolled blood pressure, recent NSAID/ACE inhibitor use.  No benefit of IV hydration per discussion with nephrology by admitting physician.  Plan to transfer to [facility] campus/teaching service when bed available.  Nephrotoxins, may need temporary HD.  Renal US, Renal artery duplex was obtained in the recent admission and unremarkable.  2.  Acute pancreatitis: Now off HCTZ.  Not sure if related to cocaine use.  Lipase was only 33-63 in last hospitalization but amylase up to 590.  Supportive management with IV fluids, pain medications in recent hospitalization.  Currently tolerating diet.  Admitted with Dilaudid 0.5 mg every 4 as needed for severe pain-watch for drug-seeking given cocaine abuse.  3.  Hypertension: Patient had hypertensive emergency in recent hospitalization with blood pressure up to 250/150 in the setting of cocaine use.  He was utilizing his sister's medication, lisinopril which is now on hold.  Beta-blockers discontinued in the setting of cocaine use.  Off HCTZ in the setting of pancreatitis and AKI.  Continue Norvasc.  Can use hydralazine if needed.  4.  Cocaine abuse: Encouraged cessation.  Will benefit from substance abuse center referral upon discharge.  DVT prophylaxis: Heparin Code Status: Full  code Family / Patient Communication: Discussed with patient Disposition Plan:   Status is: Inpatient  Remains inpatient appropriate because:Persistent severe electrolyte disturbances, Ongoing diagnostic testing needed not appropriate for outpatient work up and Inpatient level of care appropriate due to severity of illness   Dispo: The patient is from: Home              Anticipated d/c is to: Home              Anticipated d/c date is: 3 days              Patient currently is not medically stable to d/c.           Time spent: 25 min     >50% time spent in discussions with care team and coordination of care.    Alessandra Bevels, MD [facility] Pager in [place]  If 7PM-7AM, please contact night-coverage [email or web address] 04/21/2020, 11:47 AM

## 2020-04-21 NOTE — Progress Notes (Signed)
[facility] Progress Note   Subjective/interval events:   Pt left [facility] AMA  but then presented to [facility] around MN for dyspnea.  Creatinine up to 9, K 4.6, bicarb 22.  Agreeable to readmission.  Plan to transfer to [facility] when bed available given will likely need dialysis.    Background:  HPI:  Ricky Boyle is a 59 y.o. male with a past medical history of hypertension who presented to the hospital with "pain in my stomach" and a headache.  He is also has some shortness of breath but no nausea or vomiting.  He was found to have pancreatitis and HTN emergency.  He received a CTA chest/abd/pelvis which ruled out any acute aortic syndrome.  Of note, the patient's blood pressure was markedly elevated on presentation at 200/110 (peak around 258/158) and reached a nadir of 110 - 130's systolic.  Nephrology was consulted for acute kidney injury.  On US kidneys without mass or hydro.  Noted a renal artery duplex was obtained and renal artery appears patient.   No urine output is documented but he tells me that he has been urinating without difficulty and strict ins and outs are ordered.  With regard to medications he does not recognize HCTZ.  He states that is normally on lisinopril and amlodipine but has been out of medication for a couple of weeks and has been taking his sister's medication.  He uses Aleve a couple of times a month  Objective Vitals:   04/21/20 0800 04/21/20 0852 04/21/20 0853 04/21/20 1047  BP: (!) 145/118   (!) 143/90  Pulse:      Resp: (!) 21  (!) 21   Temp:      TempSrc:      SpO2:  91%     Physical Exam General: obese, nontoxic Heart: tachycardic, regular Lungs: normal WOB on RA Abdomen: obese Extremities: no edema GU: no foley but ~762mL urine in urinal jug    CXR - IMPRESSION: Mild bilateral infrahilar atelectasis and/or early infiltrate.   Abd [place]: Right Kidney: Length: 11.0 cm. Echogenicity within normal limits. No mass or hydronephrosis visualized. Left  Kidney: Length: 13.0 cm. Echogenicity within normal limits. No mass or hydronephrosis visualized.   UA neg protein, 0-5 rbc/ wbc  Assessment/Plan:  **AKI:  Baseline 1.4 as of 9/16 now with severe AKI in setting of HTN urgency (cocaine, out of meds) + contrast.  UA with neg protein and 0-5 RBC.  No obstruction on imaging.  Cr peak 9.4 on 9/19, down to 8.7 today. Voiding well but c/o sig low abd pain in SP region. No voiding issues.  No indication for HD yet. Awaiting tx to [facility] when bed available. Will do post-void I/O cath, concerned about bladder retention. Will follow.   **HTN urgency:  +cocaine, out of meds.  Home meds have been resumed and BPs are currently acceptable.  **Pancreatitis:  Per primary.  If fluids are indicated for pancreatitis I'm ok with judicious fluids.   **Dyspnea: poss related to HTN urgency.  CXR poss early infiltrate v atelectasis; mild leukocytosis but afebrile, COVID19 neg.    **Cocaine abuse: rec cessation.   Vinson Moselle, MD 04/21/2020, 12:06 PM    Additional Objective Labs: Basic Metabolic Panel: Recent Labs  Lab 04/17/20 0305 04/18/20 0807 04/19/20 0102 04/19/20 0102 04/20/20 0200 04/20/20 1232 04/21/20 0338  NA 142   < > 135  --  137  --  134*  K 4.8   < > 5.1  --  4.6  --  4.6  CL 103   < > 100  --  97*  --  100  CO2 27   < > 20*  --  22  --  20*  GLUCOSE 149*   < > 139*  --  154*  --  168*  BUN 19   < > 79*  --  108*  --  110*  CREATININE 1.43*   < > 6.83*   < > 9.05* 9.43* 8.72*  CALCIUM 9.9   < > 8.7*  --  8.9  --  8.2*  PHOS 4.9*  --  4.2  --   --   --   --    < > = values in this interval not displayed.   Liver Function Tests: Recent Labs  Lab 04/18/20 1240 04/18/20 1240 04/19/20 0102 04/20/20 0200 04/20/20 1232 04/21/20 0338  AST 74*  --   --  41  --  26  ALT 27  --   --  26  --  27  ALKPHOS 66  --   --  71  --  72  BILITOT 2.0*   < >  --  2.3* 1.4* 1.6*  PROT 6.9  --   --  7.5  --  6.7  ALBUMIN 3.3*   < > 2.9* 3.3*  --   2.9*   < > = values in this interval not displayed.   Recent Labs  Lab 04/16/20 1105 04/16/20 2130 04/20/20 0200  LIPASE 33  --  63*  AMYLASE  --  590*  --    CBC: Recent Labs  Lab 04/18/20 0807 04/18/20 0807 04/18/20 1240 04/18/20 1240 04/20/20 0200 04/20/20 1232 04/21/20 0613  WBC 17.8*   < > 17.7*   < > 15.7* 15.0* 14.3*  NEUTROABS  --   --   --   --  11.8*  --   --   HGB 18.2*   < > 17.0   < > 15.0 14.6 13.9  HCT 57.4*   < > 53.9*   < > 43.6 43.1 41.7  MCV 99.5  --  99.6  --  95.2 95.4 96.1  PLT 177   < > 154   < > 185 195 201   < > = values in this interval not displayed.   Blood Culture No results found for: SDES, SPECREQUEST, CULT, REPTSTATUS  Cardiac Enzymes: No results for input(s): CKTOTAL, CKMB, CKMBINDEX, TROPONINI in the last 168 hours. CBG: Recent Labs  Lab 04/17/20 1619 04/17/20 2349 04/18/20 1726 04/18/20 2356 04/19/20 0748  GLUCAP 151* 140* 137* 150* 151*   Iron Studies: No results for input(s): IRON, TIBC, TRANSFERRIN, FERRITIN in the last 72 hours. @lablastinr3 @ Studies/Results: DG Chest Port 1 View  Result Date: 04/20/2020 CLINICAL DATA:  Constipation and difficulty urinating. EXAM: PORTABLE CHEST 1 VIEW COMPARISON:  None. FINDINGS: Mild atelectasis and/or early infiltrate is seen along the infrahilar regions, bilaterally. There is no evidence of a pleural effusion or pneumothorax. The cardiac silhouette is mildly enlarged. The visualized skeletal structures are unremarkable. IMPRESSION: Mild bilateral infrahilar atelectasis and/or early infiltrate. Electronically Signed   By: 04/22/2020 M.D.   On: 04/20/2020 00:31   Medications:  . amLODipine  10 mg Oral Daily  . heparin  5,000 Units Subcutaneous Q8H  . sodium chloride flush  3 mL Intravenous Q12H

## 2020-04-21 NOTE — ED Notes (Signed)
x2 attempts to draw blood work Unsuccessful Will have RN call for main phlebotomy

## 2020-04-21 NOTE — ED Notes (Addendum)
RN checked on pt, pt had removed O2 monitor and [place]. When reattached to monitor pt was satting 91%. RN repleaced [place] on patient, running at 3L.

## 2020-04-22 MED ORDER — HYDROCODONE-ACETAMINOPHEN 5-325 MG PO TABS
1.0000 | ORAL_TABLET | Freq: Four times a day (QID) | ORAL | Status: DC | PRN
  Administered 2020-04-22 – 2020-04-24 (×6): 1 via ORAL
  Filled 2020-04-22 (×6): qty 1

## 2020-04-22 NOTE — Progress Notes (Signed)
PROGRESS NOTE    Ricky Boyle  IRW:[id number]  DOB: February 12, 1961  PCP: Patient, No Pcp Per Admit date:04/19/2020 Chief compliant: abdominal pain  HPI: 59 year old male with a past medical history of hypertension, cocaine abuse who presented to [facility] ED with complaints of abdominal pain for 6 days.  He was  admitted to the internal medicine teaching service at [facility] on 04/16/2020 with abdominal pain, headache, shortness of breath without associated nausea or vomiting and was found to have pancreatitis based on elevated amylase (590) and hypertensive emergency (BP peaked around 258/158).  He underwent CTA chest/abdomen/pelvis at the time which ruled out acute aortic syndrome. Subsequently, he developed an AKI and nephrology was consulted.  He was noted to have been off of his blood pressure medications for several weeks and had been taking his sisters lisinopril in addition to Aleve occasionally.  His creatinine increased to 6.83 on 9/18.  There was discussion of potentially starting temporary dialysis while hospitalized however the patient became defensive and left [facility] AMA on 9/18. He presents to [facility] ED again on 9/19 with persistent epigastric abdominal pain with associated diarrhea.  He was vague about cocaine use when asked in the ED.  Urine drug screen was positive for opiates and cocaine. In ED: Afebrile,Tachycardic, tachypneic, hypertensive 158/105-> 125/84.  Notable labs: K4.6, bicarb 22, glucose 154, BUN 108, creatinine 9.05 (yesterday: BUN 79, Cr 6.83), AG 18, lipase 63, T bili 2.3, WBC 15.7 (previously 17.7), UDS positive for opiates and cocaine.  COVID-19 negative.  CXR with mild bibasilar atelectasis and/or early infiltrate.  He was given Dilaudid 0.5 mg x 1 and lisinopril 10 mg x 1 in the ED. Patient admitted to [facility] for further management until he can be transferred to [facility] campus/teaching service on 04/23/20.   Assessment/Plan: Principal Problem:   Acute kidney injury (HCC) Active Problems:    Acute pancreatitis   Cocaine use   Essential hypertension   Acute kidney injury, severe, POA - Unclear etiology, nephrology following, appreciate insight and recommendations  - Potentially ATN in the setting of cocaine use hypertension and recent NSAID use  - Creatinine minimally downtrending over the past 24 hours, continue to follow - Defer to nephrology for further intervention, patient was agreeable to dialysis if indicated today per our discussion  Acute pancreatitis, mild:  -Unclear etiology given cocaine use and HCTZ -Lipase only minimally elevated, abdominal pain minimal to nonexistent today even with deep palpation  -Team to advance diet as tolerated -Discontinue narcotics given patient's minimal symptoms and concern for abuse    Component Value Date/Time   LIPASE 63 (H) 04/20/2020 0200   Hypertensive emergency, improving - Blood pressure as high as 250/150 likely in setting of cocaine use/abuse  - Continue to monitor closely, avoid ACE/ARB given creatinine as above  - Beta-blockers previously discontinued in the setting of cocaine use - Off HCTZ in the setting of pancreatitis and AKI.   - Continue amlodipine, as needed hydralazine as indicated; unfortunately may have to use clonidine for blood pressure control given the limitations as above although given patient's history of noncompliance this is not an ideal medication for him either  Polysubstance abuse/cocaine abuse:  -Lengthy discussion about need for cessation given acute pancreatitis, hypertensive urgency/emergency and acute renal failure as outlined above   DVT prophylaxis: Heparin Code Status: Full code Family / Patient Communication: Discussed with patient Disposition Plan:   Status is: Inpatient  Remains inpatient appropriate due to ongoing need for further evaluation with nephrology, possible dialysis  and close monitoring, repeat labs and high risk for decompensation  Dispo: The patient is from: Home               Anticipated d/c is to: To be determined              Anticipated d/c date is: 3 days              Patient currently is not medically stable to d/c.  Subjective: No acute issues or events overnight, denies nausea, vomiting, diarrhea, constipation, headache, fevers, chills..  Objective: Vitals:   04/21/20 1706 04/21/20 1809 04/21/20 2023 04/22/20 0556  BP: 140/85 (!) 151/81 126/71 129/83  Pulse: 98 99 (!) 103 94  Resp: 19 18 20 19   Temp:  98.8 F (37.1 C) 99.3 F (37.4 C) 98.3 F (36.8 C)  TempSrc:  Oral Oral   SpO2: 98% 98% 95% 99%    Intake/Output Summary (Last 24 hours) at 04/22/2020 0802 Last data filed at 04/22/2020 0601 Gross per 24 hour  Intake 440 ml  Output --  Net 440 ml   There were no vitals filed for this visit.  Physical Examination:  General: Heavy built, no acute distress noted Head ENT: Atraumatic normocephalic, PERRLA, neck supple Heart: S1-S2 heard, regular rate and rhythm, no murmurs.  No leg edema noted Lungs: Equal air entry bilaterally, no rhonchi or rales on exam, no accessory muscle use Abdomen: Bowel sounds heard, soft, tender in epigastrium, nondistended. No organomegaly.  No CVA tenderness Extremities: No pedal edema.  No cyanosis or clubbing. Neurological: Awake alert oriented x3, no focal weakness or numbness, strength and sensations to crude touch intact Skin: No wounds or rashes.  Data Reviewed: I have personally reviewed following labs and imaging studies  CBC: Recent Labs  Lab 04/18/20 0807 04/18/20 1240 04/20/20 0200 04/20/20 1232 04/21/20 0613  WBC 17.8* 17.7* 15.7* 15.0* 14.3*  NEUTROABS  --   --  11.8*  --   --   HGB 18.2* 17.0 15.0 14.6 13.9  HCT 57.4* 53.9* 43.6 43.1 41.7  MCV 99.5 99.6 95.2 95.4 96.1  PLT 177 154 185 195 201   Basic Metabolic Panel: Recent Labs  Lab 04/17/20 0305 04/17/20 0305 04/18/20 0807 04/18/20 0807 04/18/20 1240 04/19/20 0102 04/20/20 0200 04/20/20 1232 04/21/20 0338  NA 142   < >  136  --  135 135 137  --  134*  K 4.8   < > 5.3*  --  5.1 5.1 4.6  --  4.6  CL 103   < > 100  --  101 100 97*  --  100  CO2 27   < > 20*  --  18* 20* 22  --  20*  GLUCOSE 149*   < > 133*  --  162* 139* 154*  --  168*  BUN 19   < > 58*  --  66* 79* 108*  --  110*  CREATININE 1.43*   < > 5.17*   < > 5.58* 6.83* 9.05* 9.43* 8.72*  CALCIUM 9.9   < > 9.1  --  8.9 8.7* 8.9  --  8.2*  MG 2.2  --   --   --   --   --   --   --   --   PHOS 4.9*  --   --   --   --  4.2  --   --   --    < > = values in this interval not  displayed.   GFR: Estimated Creatinine Clearance: 11.2 mL/min (A) (by C-G formula based on SCr of 8.72 mg/dL (H)). Liver Function Tests: Recent Labs  Lab 04/17/20 0305 04/17/20 0305 04/18/20 0807 04/18/20 1240 04/19/20 0102 04/20/20 0200 04/20/20 1232 04/21/20 0338  AST 34  --  75* 74*  --  41  --  26  ALT 33  --  27 27  --  26  --  27  ALKPHOS 80  --  67 66  --  71  --  72  BILITOT 0.9   < > 2.2* 2.0*  --  2.3* 1.4* 1.6*  PROT 8.8*  --  7.3 6.9  --  7.5  --  6.7  ALBUMIN 4.3   < > 3.6 3.3* 2.9* 3.3*  --  2.9*   < > = values in this interval not displayed.   Recent Labs  Lab 04/16/20 1105 04/16/20 2130 04/20/20 0200  LIPASE 33  --  63*  AMYLASE  --  590*  --    No results for input(s): AMMONIA in the last 168 hours. Coagulation Profile: No results for input(s): INR, PROTIME in the last 168 hours. Cardiac Enzymes: No results for input(s): CKTOTAL, CKMB, CKMBINDEX, TROPONINI in the last 168 hours. BNP (last 3 results) No results for input(s): PROBNP in the last 8760 hours. HbA1C: No results for input(s): HGBA1C in the last 72 hours. CBG: Recent Labs  Lab 04/17/20 1619 04/17/20 2349 04/18/20 1726 04/18/20 2356 04/19/20 0748  GLUCAP 151* 140* 137* 150* 151*   Lipid Profile: Recent Labs    04/20/20 1232  CHOL 149  HDL 17*  LDLCALC 87  TRIG 419*  CHOLHDL 8.8   Thyroid Function Tests: No results for input(s): TSH, T4TOTAL, FREET4, T3FREE, THYROIDAB  in the last 72 hours. Anemia Panel: No results for input(s): VITAMINB12, FOLATE, FERRITIN, TIBC, IRON, RETICCTPCT in the last 72 hours. Sepsis Labs: Recent Labs  Lab 04/16/20 1700 04/18/20 0807 04/20/20 1406  LATICACIDVEN 3.0* 2.8* 1.0    Recent Results (from the past 240 hour(s))  SARS Coronavirus 2 by RT PCR (hospital order, performed in [facility] hospital lab) Nasopharyngeal Nasopharyngeal Swab     Status: None   Collection Time: 04/16/20  8:41 PM   Specimen: Nasopharyngeal Swab  Result Value Ref Range Status   SARS Coronavirus 2 NEGATIVE NEGATIVE Final    Comment: (NOTE) SARS-CoV-2 target nucleic acids are NOT DETECTED.  The SARS-CoV-2 RNA is generally detectable in upper and lower respiratory specimens during the acute phase of infection. The lowest concentration of SARS-CoV-2 viral copies this assay can detect is 250 copies / mL. A negative result does not preclude SARS-CoV-2 infection and should not be used as the sole basis for treatment or other patient management decisions.  A negative result may occur with improper specimen collection / handling, submission of specimen other than nasopharyngeal swab, presence of viral mutation(s) within the areas targeted by this assay, and inadequate number of viral copies (<250 copies / mL). A negative result must be combined with clinical observations, patient history, and epidemiological information.  Fact Sheet for Patients:   BoilerBrush.com.cy  Fact Sheet for Healthcare Providers: https://pope.com/  This test is not yet approved or  cleared by the [place] FDA and has been authorized for detection and/or diagnosis of SARS-CoV-2 by FDA under an Emergency Use Authorization (EUA).  This EUA will remain in effect (meaning this test can be used) for the duration of the COVID-19 declaration under Section 564(b)(1)  of the Act, 21 U.S.C. section 360bbb-3(b)(1), unless the  authorization is terminated or revoked sooner.  Performed at [facility], [address] [place], [place] [address]   SARS Coronavirus 2 by RT PCR (hospital order, performed in [facility] hospital lab) Nasopharyngeal Nasopharyngeal Swab     Status: None   Collection Time: 04/20/20  2:02 AM   Specimen: Nasopharyngeal Swab  Result Value Ref Range Status   SARS Coronavirus 2 NEGATIVE NEGATIVE Final    Comment: (NOTE) SARS-CoV-2 target nucleic acids are NOT DETECTED.  The SARS-CoV-2 RNA is generally detectable in upper and lower respiratory specimens during the acute phase of infection. The lowest concentration of SARS-CoV-2 viral copies this assay can detect is 250 copies / mL. A negative result does not preclude SARS-CoV-2 infection and should not be used as the sole basis for treatment or other patient management decisions.  A negative result may occur with improper specimen collection / handling, submission of specimen other than nasopharyngeal swab, presence of viral mutation(s) within the areas targeted by this assay, and inadequate number of viral copies (<250 copies / mL). A negative result must be combined with clinical observations, patient history, and epidemiological information.  Fact Sheet for Patients:   BoilerBrush.com.cyhttps://www.fda.gov/media/136312/download  Fact Sheet for Healthcare Providers: https://pope.com/https://www.fda.gov/media/136313/download  This test is not yet approved or  cleared by the [place] FDA and has been authorized for detection and/or diagnosis of SARS-CoV-2 by FDA under an Emergency Use Authorization (EUA).  This EUA will remain in effect (meaning this test can be used) for the duration of the COVID-19 declaration under Section 564(b)(1) of the Act, 21 U.S.C. section 360bbb-3(b)(1), unless the authorization is terminated or revoked sooner.  Performed at [facility], [address] [place], [place] [address]       Radiology  Studies: No results found.    Scheduled Meds:  amLODipine  10 mg Oral Daily   heparin  5,000 Units Subcutaneous Q8H   sodium chloride flush  3 mL Intravenous Q12H    Time spent: 40min  Azucena FallenWilliam C Eulanda Dorion, DO   If 7PM-7AM, please contact night-coverage [email or web address] 04/22/2020, 8:02 AM

## 2020-04-22 NOTE — Progress Notes (Signed)
Patient non compliant with wearing heart rate monitor and reported to RN that he no longer wants to wear it, MD has been notified and patient has been educated. There have been no new orders and RN will continue to monitor this patient

## 2020-04-22 NOTE — Progress Notes (Signed)
Informed of patient who left from [facility] on 9/18. [facility] will take over care on 04/23/20 at 7 AM.

## 2020-04-22 NOTE — Progress Notes (Signed)
Patient ID: Ricky Boyle, male   DOB: [birth year], 59 y.o.   MRN: [id number] S: Pt complaining of abdominal pain and uncomfortable in the bed. O:BP 137/79 (BP Location: Left Arm)   Pulse 92   Temp 98.2 F (36.8 C) (Oral)   Resp 20   SpO2 99%   Intake/Output Summary (Last 24 hours) at 04/22/2020 1432 Last data filed at 04/22/2020 1337 Gross per 24 hour  Intake 1140 ml  Output 500 ml  Net 640 ml   Intake/Output: I/O last 3 completed shifts: In: 440 [P.O.:440] Out: 325 [Urine:325]  Intake/Output this shift:  Total I/O In: 700 [P.O.:700] Out: 500 [Urine:500] Weight change:  Gen: NAD CVS: RRR, no rub Resp: cta Abd: +BS, obese, mildly tender to palpation, no guarding or rebound Ext: no edema  Recent Labs  Lab 04/16/20 1105 04/16/20 1105 04/17/20 0305 04/18/20 0807 04/18/20 1240 04/19/20 0102 04/20/20 0200 04/20/20 1232 04/21/20 0338  NA 138  --  142 136 135 135 137  --  134*  K 3.9  --  4.8 5.3* 5.1 5.1 4.6  --  4.6  CL 103  --  103 100 101 100 97*  --  100  CO2 25  --  27 20* 18* 20* 22  --  20*  GLUCOSE 112*  --  149* 133* 162* 139* 154*  --  168*  BUN 18  --  19 58* 66* 79* 108*  --  110*  CREATININE 1.41*   < > 1.43* 5.17* 5.58* 6.83* 9.05* 9.43* 8.72*  ALBUMIN 3.6  --  4.3 3.6 3.3* 2.9* 3.3*  --  2.9*  CALCIUM 9.3  --  9.9 9.1 8.9 8.7* 8.9  --  8.2*  PHOS  --   --  4.9*  --   --  4.2  --   --   --   AST 24  --  34 75* 74*  --  41  --  26  ALT 29  --  33 27 27  --  26  --  27   < > = values in this interval not displayed.   Liver Function Tests: Recent Labs  Lab 04/18/20 1240 04/18/20 1240 04/19/20 0102 04/20/20 0200 04/20/20 1232 04/21/20 0338  AST 74*  --   --  41  --  26  ALT 27  --   --  26  --  27  ALKPHOS 66  --   --  71  --  72  BILITOT 2.0*   < >  --  2.3* 1.4* 1.6*  PROT 6.9  --   --  7.5  --  6.7  ALBUMIN 3.3*   < > 2.9* 3.3*  --  2.9*   < > = values in this interval not displayed.   Recent Labs  Lab 04/16/20 1105 04/16/20 2130  04/20/20 0200  LIPASE 33  --  63*  AMYLASE  --  590*  --    No results for input(s): AMMONIA in the last 168 hours. CBC: Recent Labs  Lab 04/18/20 0807 04/18/20 0807 04/18/20 1240 04/18/20 1240 04/20/20 0200 04/20/20 1232 04/21/20 0613  WBC 17.8*   < > 17.7*   < > 15.7* 15.0* 14.3*  NEUTROABS  --   --   --   --  11.8*  --   --   HGB 18.2*   < > 17.0   < > 15.0 14.6 13.9  HCT 57.4*   < > 53.9*   < >  43.6 43.1 41.7  MCV 99.5  --  99.6  --  95.2 95.4 96.1  PLT 177   < > 154   < > 185 195 201   < > = values in this interval not displayed.   Cardiac Enzymes: No results for input(s): CKTOTAL, CKMB, CKMBINDEX, TROPONINI in the last 168 hours. CBG: Recent Labs  Lab 04/17/20 1619 04/17/20 2349 04/18/20 1726 04/18/20 2356 04/19/20 0748  GLUCAP 151* 140* 137* 150* 151*    Iron Studies: No results for input(s): IRON, TIBC, TRANSFERRIN, FERRITIN in the last 72 hours. Studies/Results: No results found. Marland Kitchen amLODipine  10 mg Oral Daily  . heparin  5,000 Units Subcutaneous Q8H  . sodium chloride flush  3 mL Intravenous Q12H    BMET    Component Value Date/Time   NA 134 (L) 04/21/2020 0338   K 4.6 04/21/2020 0338   CL 100 04/21/2020 0338   CO2 20 (L) 04/21/2020 0338   GLUCOSE 168 (H) 04/21/2020 0338   BUN 110 (H) 04/21/2020 0338   CREATININE 8.72 (H) 04/21/2020 0338   CALCIUM 8.2 (L) 04/21/2020 0338   GFRNONAA 6 (L) 04/21/2020 0338   GFRAA 7 (L) 04/21/2020 0338   CBC    Component Value Date/Time   WBC 14.3 (H) 04/21/2020 0613   RBC 4.34 04/21/2020 0613   HGB 13.9 04/21/2020 0613   HCT 41.7 04/21/2020 0613   PLT 201 04/21/2020 0613   MCV 96.1 04/21/2020 0613   MCH 32.0 04/21/2020 0613   MCHC 33.3 04/21/2020 0613   RDW 14.0 04/21/2020 0613   LYMPHSABS 1.4 04/20/2020 0200   MONOABS 2.5 (H) 04/20/2020 0200   EOSABS 0.0 04/20/2020 0200   BASOSABS 0.0 04/20/2020 0200   Admitted 04/16/20 with hypertensive urgency and AKI/CKD but he left [facility] AMA  but then presented to  [facility] around [place] on 04/19/20 for dyspnea.  Creatinine up to 9, K 4.6, bicarb 22.  Agreeable to readmission.  He was transfered to [facility] given likelihood he will need dialysis.    Assessment/Plan:  1. AKI/CKD (unknown stage as his last Scr was 1.4 in 2016) in setting of hypertensive urgency, IV contrast, and cocaine.  Reports good UOP but is not collecting his urine.  Despite elevated BUN/Cr he denies any uremic symptoms.  We did discuss that he may need dialysis and will need to stop cocaine if he wants to delay the progression to ESRD 1. Scr improving but BUN up slightly 2. Continue to follow UOP and SCr 3. Will need education regarding RRT 2. HTN urgency- + cocaine and was out of meds.  Improved with home meds (except for lisiniopril due to AKI) 3. Pancreatitis- per primary 4. Dyspnea- improved 5. Cocaine abuse- discussed the need for abstinence 6. Vascular access- discussed that he will need an AVF or AVG even if he doesn't require dialysis at this time.  Will educate and if amenable will consult [facility] tomorrow.   Irena Cords, MD [employer] [phone]

## 2020-04-23 DIAGNOSIS — R1011 Right upper quadrant pain: Secondary | ICD-10-CM

## 2020-04-23 DIAGNOSIS — G4733 Obstructive sleep apnea (adult) (pediatric): Secondary | ICD-10-CM

## 2020-04-23 DIAGNOSIS — I161 Hypertensive emergency: Secondary | ICD-10-CM

## 2020-04-23 DIAGNOSIS — F1410 Cocaine abuse, uncomplicated: Secondary | ICD-10-CM

## 2020-04-23 DIAGNOSIS — Z720 Tobacco use: Secondary | ICD-10-CM

## 2020-04-23 DIAGNOSIS — R1012 Left upper quadrant pain: Secondary | ICD-10-CM

## 2020-04-23 DIAGNOSIS — Z6841 Body Mass Index (BMI) 40.0 and over, adult: Secondary | ICD-10-CM

## 2020-04-23 LAB — RENAL FUNCTION PANEL
Albumin: 2.3 g/dL — ABNORMAL LOW (ref 3.5–5.0)
Anion gap: 11 (ref 5–15)
BUN: 107 mg/dL — ABNORMAL HIGH (ref 6–20)
CO2: 20 mmol/L — ABNORMAL LOW (ref 22–32)
Calcium: 8.5 mg/dL — ABNORMAL LOW (ref 8.9–10.3)
Chloride: 106 mmol/L (ref 98–111)
Creatinine, Ser: 5.27 mg/dL — ABNORMAL HIGH (ref 0.61–1.24)
GFR calc Af Amer: 13 mL/min — ABNORMAL LOW (ref >60)
GFR calc non Af Amer: 11 mL/min — ABNORMAL LOW (ref >60)
Glucose, Bld: 175 mg/dL — ABNORMAL HIGH (ref 70–99)
Phosphorus: 5.1 mg/dL — ABNORMAL HIGH (ref 2.5–4.6)
Potassium: 4.1 mmol/L (ref 3.5–5.1)
Sodium: 137 mmol/L (ref 135–145)

## 2020-04-23 LAB — CBC
HCT: 38.1 % — ABNORMAL LOW (ref 39.0–52.0)
Hemoglobin: 12.4 g/dL — ABNORMAL LOW (ref 13.0–17.0)
MCH: 30.9 pg (ref 26.0–34.0)
MCHC: 32.5 g/dL (ref 30.0–36.0)
MCV: 95 fL (ref 80.0–100.0)
Platelets: 240 10*3/uL (ref 150–400)
RBC: 4.01 MIL/uL — ABNORMAL LOW (ref 4.22–5.81)
RDW: 14.2 % (ref 11.5–15.5)
WBC: 14.9 10*3/uL — ABNORMAL HIGH (ref 4.0–10.5)
nRBC: 0.1 % (ref 0.0–0.2)

## 2020-04-23 MED ORDER — SODIUM CHLORIDE 0.9 % IV SOLN: INTRAVENOUS | Status: DC

## 2020-04-23 NOTE — Progress Notes (Signed)
Subjective: Patient reports that he did well overnight, still having some diffuse abdominal pain.  Does not know what worsens the pain.  He has been able to eat solid foods, including chicken Caesar salad, cobbler, and bacon, denied any nausea or vomiting with eating.  Last bowel movement was yesterday.  Expressed concerns about his limited fluid intake and is requesting an increase in his fluids, he reports urinating well.  Discussed that his labs are still pending however he will likely need vascular access at some point.  We discussed that his pancreatitis seems to be improving.  Objective:  Vital signs in last 24 hours: Vitals:   04/22/20 1015 04/22/20 2243 04/23/20 0500 04/23/20 0533  BP: 137/79 106/61  127/63  Pulse: 92 89  93  Resp: 20 18  17   Temp: 98.2 F (36.8 C) 98.5 F (36.9 C)  97.7 F (36.5 C)  TempSrc: Oral     SpO2: 99% 93%  97%  Weight:  121.5 kg 121.5 kg    Physical Exam Constitutional:      Appearance: He is well-developed.  Cardiovascular:     Rate and Rhythm: Normal rate and regular rhythm.     Heart sounds: Murmur heard.   Pulmonary:     Effort: Pulmonary effort is normal.     Breath sounds: Normal breath sounds. No decreased breath sounds or wheezing.  Abdominal:     General: Bowel sounds are normal.     Palpations: Abdomen is soft.     Tenderness: There is abdominal tenderness (RUQ and LUQ). There is no guarding or rebound.  Musculoskeletal:        General: Normal range of motion.     Cervical back: Normal range of motion and neck supple.     Right lower leg: No edema.     Left lower leg: No edema.  Skin:    General: Skin is warm and dry.     Capillary Refill: Capillary refill takes less than 2 seconds.  Neurological:     General: No focal deficit present.     Mental Status: He is alert.  Psychiatric:        Mood and Affect: Mood normal.        Behavior: Behavior normal.    Assessment/Plan:  Principal Problem:   Acute kidney injury  (HCC) Active Problems:   Acute pancreatitis   Cocaine use   Essential hypertension  This is a 59 year old male with a history of hypertension, cocaine abuse, tobacco use, and OSA who initially presented on 9/15 with abdominal pain, headache, shortness of breath, and found to have pancreatitis and hypertensive emergency.  Obtained a CTA chest/abdomen/pelvis to rule out aortic syndrome.  And subsequently developed AKI and nephrology consulte, nephrology was consulted.  They had discussed temporary dialysis and patient left AMA on 9/18 because he did not want to start dialysis.  Presented to [facility] on 9/19 with persistent epigastric abdominal pain, diarrhea, and continued shortness of breath.  Noted to have elevation in creatinine from 6>9.  UDS positive for opiates and cocaine. Patient admitted to [facility] for further management until transferred to [facility] on 9/22.  AKI: Creatinine elevated to 9 on readmission in setting of hypertensive emergency, IV contrast, and cocaine use.  Nephrology is following and had discussed that patient may need dialysis and will need to stop using cocaine.  They did discuss that he will need in aVF for ABG even if patient does not require dialysis at this time, may  consult vascular today for this.  Creatinine today is 5.27, improved from Cr 2 days ago at 8.72. Nephrology started on IVFs.  -Nephrology following, appreciate recommendations -Strict I+Os -Started NS at 50 cc/hr -Daily renal function panel -Avoid nephrotoxic agents -Increase dietary fluid restriction to 1500 mL  Acute pancreatitis: Improving. Lipase 33>63, amylase on 9/15 was 590. Triglycerides 224, ethanol <10. Patient reports that he continues to have some abdominal pain however has been tolerating his diet well.  IV narcotics were discontinued yesterday due to minimal symptoms and concern for abuse. Currently has Norco 5-325 mg daily q6 PRN.   -Advance diet as tolerated -Norco 5-325 mg daily q6  PRN  Hypertensive emergency: Initially blood pressures were up to 250 systolic on admission on 9/15. On readmission BP was 125/84, resumed on amlodipine 10 mg daily and hydralazine PRN. Last hydralazine dose was 9/19. Blood pressures have been around 106-137/61-83. Improving and now stable.   -Currently on amlodipine 10 milligrams daily -Holding ACE/ARB secondary to AKI.  Holding HCTZ in setting of pancreatitis. -Does have hydralazine as needed, given improvement in blood pressures we will discontinue this for now -Had been holding beta-blocker in setting of cocaine use  Polysubstance use: UDS positive for cocaine and benzos. -TOC consult for cessation resources and PCP needs  Morbid obesity: BMI 43.  Educated on importance of weight loss, will need to follow-up with PCP for further education.  Prior to Admission Living Arrangement: Home Anticipated Discharge Location: Home Barriers to Discharge: Continued medical management and improvement in kidney function Dispo: Anticipated discharge in approximately 3-4 day(s).   Claudean Severance, MD 04/23/2020, 7:12 AM Pager: [phone] After 5pm on weekdays and 1pm on weekends: On Call pager [phone]

## 2020-04-23 NOTE — Progress Notes (Signed)
Patient ID: Ricky Boyle, male   DOB: January 31, 1961, 59 y.o.   MRN: [id number] S: Feels well without new complaints. O:BP 130/67 (BP Location: Left Arm)   Pulse 91   Temp 97.8 F (36.6 C) (Oral)   Resp 18   Wt 121.5 kg   SpO2 98%   BMI 43.23 kg/m   Intake/Output Summary (Last 24 hours) at 04/23/2020 1150 Last data filed at 04/23/2020 0601 Gross per 24 hour  Intake 1100 ml  Output 500 ml  Net 600 ml   Intake/Output: I/O last 3 completed shifts: In: 1500 [P.O.:1500] Out: 1000 [Urine:1000]  Intake/Output this shift:  No intake/output data recorded. Weight change:  Gen: NAD CVS: RRR, no rub Resp: cta Abd: +BS, soft, NT Ext: no edema Neuro: no asterixis  Recent Labs  Lab 04/17/20 0305 04/17/20 0305 04/18/20 0807 04/18/20 1240 04/19/20 0102 04/20/20 0200 04/20/20 1232 04/21/20 0338 04/23/20 0624  NA 142  --  136 135 135 137  --  134* 137  K 4.8  --  5.3* 5.1 5.1 4.6  --  4.6 4.1  CL 103  --  100 101 100 97*  --  100 106  CO2 27  --  20* 18* 20* 22  --  20* 20*  GLUCOSE 149*  --  133* 162* 139* 154*  --  168* 175*  BUN 19  --  58* 66* 79* 108*  --  110* 107*  CREATININE 1.43*   < > 5.17* 5.58* 6.83* 9.05* 9.43* 8.72* 5.27*  ALBUMIN 4.3  --  3.6 3.3* 2.9* 3.3*  --  2.9* 2.3*  CALCIUM 9.9  --  9.1 8.9 8.7* 8.9  --  8.2* 8.5*  PHOS 4.9*  --   --   --  4.2  --   --   --  5.1*  AST 34  --  75* 74*  --  41  --  26  --   ALT 33  --  27 27  --  26  --  27  --    < > = values in this interval not displayed.   Liver Function Tests: Recent Labs  Lab 04/18/20 1240 04/19/20 0102 04/20/20 0200 04/20/20 1232 04/21/20 0338 04/23/20 0624  AST 74*  --  41  --  26  --   ALT 27  --  26  --  27  --   ALKPHOS 66  --  71  --  72  --   BILITOT 2.0*  --  2.3* 1.4* 1.6*  --   PROT 6.9  --  7.5  --  6.7  --   ALBUMIN 3.3*   < > 3.3*  --  2.9* 2.3*   < > = values in this interval not displayed.   Recent Labs  Lab 04/16/20 2130 04/20/20 0200  LIPASE  --  63*  AMYLASE 590*  --     No results for input(s): AMMONIA in the last 168 hours. CBC: Recent Labs  Lab 04/18/20 1240 04/18/20 1240 04/20/20 0200 04/20/20 0200 04/20/20 1232 04/21/20 0613 04/23/20 0624  WBC 17.7*   < > 15.7*   < > 15.0* 14.3* 14.9*  NEUTROABS  --   --  11.8*  --   --   --   --   HGB 17.0   < > 15.0   < > 14.6 13.9 12.4*  HCT 53.9*   < > 43.6   < > 43.1 41.7 38.1*  MCV 99.6  --  95.2  --  95.4 96.1 95.0  PLT 154   < > 185   < > 195 201 240   < > = values in this interval not displayed.   Cardiac Enzymes: No results for input(s): CKTOTAL, CKMB, CKMBINDEX, TROPONINI in the last 168 hours. CBG: Recent Labs  Lab 04/17/20 1619 04/17/20 2349 04/18/20 1726 04/18/20 2356 04/19/20 0748  GLUCAP 151* 140* 137* 150* 151*    Iron Studies: No results for input(s): IRON, TIBC, TRANSFERRIN, FERRITIN in the last 72 hours. Studies/Results: No results found. Marland Kitchen amLODipine  10 mg Oral Daily  . heparin  5,000 Units Subcutaneous Q8H  . sodium chloride flush  3 mL Intravenous Q12H    BMET    Component Value Date/Time   NA 137 04/23/2020 0624   K 4.1 04/23/2020 0624   CL 106 04/23/2020 0624   CO2 20 (L) 04/23/2020 0624   GLUCOSE 175 (H) 04/23/2020 0624   BUN 107 (H) 04/23/2020 0624   CREATININE 5.27 (H) 04/23/2020 0624   CALCIUM 8.5 (L) 04/23/2020 0624   GFRNONAA 11 (L) 04/23/2020 0624   GFRAA 13 (L) 04/23/2020 0624   CBC    Component Value Date/Time   WBC 14.9 (H) 04/23/2020 0624   RBC 4.01 (L) 04/23/2020 0624   HGB 12.4 (L) 04/23/2020 0624   HCT 38.1 (L) 04/23/2020 0624   PLT 240 04/23/2020 0624   MCV 95.0 04/23/2020 0624   MCH 30.9 04/23/2020 0624   MCHC 32.5 04/23/2020 0624   RDW 14.2 04/23/2020 0624   LYMPHSABS 1.4 04/20/2020 0200   MONOABS 2.5 (H) 04/20/2020 0200   EOSABS 0.0 04/20/2020 0200   BASOSABS 0.0 04/20/2020 0200    Admitted 04/16/20 with hypertensive urgency and AKI/CKD but he left [facility] AMA but then presented to [facility] around [place] on 04/19/20 for dyspnea. Creatinine  up to 9, K 4.6, bicarb 22. Agreeable to readmission. He was transfered to [facility] given likelihood he will need dialysis.   Assessment/Plan:  1. AKI/CKD stage IIIa (unknown stage as his last Scr was 1.4 04/16/20) in setting of hypertensive urgency, IV contrast, and cocaine.  Reports good UOP but is not collecting his urine.  Despite elevated BUN/Cr he denies any uremic symptoms.  We did discuss that he may need dialysis and will need to stop cocaine if he wants to delay the progression to ESRD 1. Scr continues to improve and is 5.27 today.  BUN also slightly improved. 2. No uremic symptoms 3. Continue to follow UOP and SCr 4. FeNa is <1% so will start IVF's and follow UOP and Scr. 2. HTN urgency- + cocaine and was out of meds.  Improved with home meds (except for lisiniopril due to AKI) 3. Pancreatitis- per primary 4. Dyspnea- improved 5. Cocaine abuse- discussed the need for abstinence 6. Vascular access-  Will consult [facility]VS if Scr does not continue to improve   Irena Cords, MD [facility] [phone]

## 2020-04-24 DIAGNOSIS — R739 Hyperglycemia, unspecified: Secondary | ICD-10-CM

## 2020-04-24 LAB — COMPREHENSIVE METABOLIC PANEL
ALT: 32 U/L (ref 0–44)
AST: 21 U/L (ref 15–41)
Albumin: 2.5 g/dL — ABNORMAL LOW (ref 3.5–5.0)
Alkaline Phosphatase: 88 U/L (ref 38–126)
Anion gap: 10 (ref 5–15)
BUN: 74 mg/dL — ABNORMAL HIGH (ref 6–20)
CO2: 22 mmol/L (ref 22–32)
Calcium: 8.6 mg/dL — ABNORMAL LOW (ref 8.9–10.3)
Chloride: 107 mmol/L (ref 98–111)
Creatinine, Ser: 3.54 mg/dL — ABNORMAL HIGH (ref 0.61–1.24)
GFR calc Af Amer: 21 mL/min — ABNORMAL LOW (ref >60)
GFR calc non Af Amer: 18 mL/min — ABNORMAL LOW (ref >60)
Glucose, Bld: 294 mg/dL — ABNORMAL HIGH (ref 70–99)
Potassium: 4.4 mmol/L (ref 3.5–5.1)
Sodium: 139 mmol/L (ref 135–145)
Total Bilirubin: 0.9 mg/dL (ref 0.3–1.2)
Total Protein: 7.1 g/dL (ref 6.5–8.1)

## 2020-04-24 LAB — PHOSPHORUS: Phosphorus: 3.8 mg/dL (ref 2.5–4.6)

## 2020-04-24 LAB — CBC
HCT: 39.3 % (ref 39.0–52.0)
Hemoglobin: 13 g/dL (ref 13.0–17.0)
MCH: 31.9 pg (ref 26.0–34.0)
MCHC: 33.1 g/dL (ref 30.0–36.0)
MCV: 96.3 fL (ref 80.0–100.0)
Platelets: 267 10*3/uL (ref 150–400)
RBC: 4.08 MIL/uL — ABNORMAL LOW (ref 4.22–5.81)
RDW: 14.1 % (ref 11.5–15.5)
WBC: 17 10*3/uL — ABNORMAL HIGH (ref 4.0–10.5)
nRBC: 0 % (ref 0.0–0.2)

## 2020-04-24 LAB — GLUCOSE, CAPILLARY
Glucose-Capillary: >600 mg/dL (ref 70–99)
Glucose-Capillary: 379 mg/dL — ABNORMAL HIGH (ref 70–99)
Glucose-Capillary: 289 mg/dL — ABNORMAL HIGH (ref 70–99)
Glucose-Capillary: 261 mg/dL — ABNORMAL HIGH (ref 70–99)
Glucose-Capillary: 222 mg/dL — ABNORMAL HIGH (ref 70–99)

## 2020-04-24 MED ORDER — SODIUM CHLORIDE 0.9 % IV BOLUS
1000.0000 mL | Freq: Once | INTRAVENOUS | Status: AC
  Administered 2020-04-24: 1000 mL via INTRAVENOUS

## 2020-04-24 NOTE — Progress Notes (Addendum)
   Subjective:   Ricky Boyle states that he is feeling better today. He is eager to go home and states that he has been tolerating PO intake well. He says he continues to pass gas and has polyuria. He denies any nausea, CP or SOB.  Objective:  Vital signs in last 24 hours: Vitals:   04/23/20 1710 04/23/20 2142 04/24/20 0606 04/24/20 0847  BP: 125/67 (!) 100/43 (!) 159/80 (!) 142/83  Pulse: 92 89 96 91  Resp: 18 18 17 20   Temp: 98 F (36.7 C) 98.7 F (37.1 C) 99.4 F (37.4 C) 97.8 F (36.6 C)  TempSrc: Oral Oral Oral Oral  SpO2: 99% 98% 95% 99%  Weight:  125.1 kg     General: Patient appears well. No acute distress. Eyes: Sclera non-icteric. No conjunctival injection.  Respiratory: Lungs are CTA, bilaterally. No wheezes, rales, or rhonchi.  Cardiovascular: Regular rate and rhythm. No murmurs, rubs, or gallops.  Abdominal: Distended but soft without tenderness to palpation, guarding or rebound. Bowel sounds intact.  Skin: No lesions. No rashes.  Psych: Flat affect. Normal tone of voice.   Assessment/Plan:  Principal Problem:   Acute kidney injury (HCC) Active Problems:   Acute pancreatitis   Cocaine use   Essential hypertension  # Severe Hyperglycemia likely 2/2 Acute Pancreatitis  Blood sugars previously stable, although elevated to 294 on morning CMP and > 600 on CBG testing this afternoon. Patient endorses polyuria but denies other symptoms. Hb A1c 04/17/20 was 5.2 and patient denies history of DM or glucose lowering agents at home. Patient mildly tachycardic but otherwise stable, feeling well. - Repeat CBG 379 - Will hold off on insulin for now given no DM history and clinical stability, although will check CBG q2 hrs and begin insulin if sugars remain elevated - Will give 1L NS bolus  # Acute Pancreatitis Etiology of patient's pancreatitis is unclear. Lipase 33 > 63, amylase 9/15 was 590, triglycerides 224, ethanol < 10. Patient denies abdominal pain, nausea or vomiting  today and tolerates PO intake well.  - Continue to advance diet as tolerated - Fluid restriction lifted last night, will give NS bolus  - Continue norco 5-325mg  q6hrs PRN for pain   # Improving Prerenal AKI  Creatinine initially ~9 this admission, improved to 3.54 today. BUN 107 yesterday improved to 74 today. FENa < 1%. Etiology likely multifactorial, 2/2 HTN emergency (with overcorrection) and CTA chest, abdomen, pelvis last admission, with prerenal component, likely due to decreased PO intake in the setting of acute pancreatitis.  - Nephrology on board, appreciate their recommendations - Continue IVF   - Continue to monitor BMP  # Uncontrolled Hypertension Patient had HTN emergency on previous admission with BP as high as 240's systolic, controlled on oral medications after NTG drip. Previously controlled this admission on amlodipine alone; however, BP elevated this afternoon.  - Repeat blood pressure normalized  - Will continue amlodipine 10mg  daily for now - consider resuming lisinopril / HCTZ upon discharge once AKI and pancreatitis resolve  # Polysubstance Use  Patient has a history of cocaine use and UDS positive for benzos.  - TOC has been consulted for cessation resources and PCP needs.  Prior to Admission Living Arrangement: Home Anticipated Discharge Location: Home Barriers to Discharge: Hyperglycemia, renal function improvement  10/15, MD 04/24/2020, 12:55 PM Pager: [phone] After 5pm on weekdays and 1pm on weekends: On Call pager [phone]

## 2020-04-24 NOTE — Progress Notes (Signed)
Patient ID: Ricky Boyle, male   DOB: [birth year], 59 y.o.   MRN: [id number] S: Feels better and "ready to go home" O:BP (!) 142/83 (BP Location: Left Arm)   Pulse 91   Temp 97.8 F (36.6 C) (Oral)   Resp 20   Wt 125.1 kg   SpO2 99%   BMI 44.51 kg/m   Intake/Output Summary (Last 24 hours) at 04/24/2020 1240 Last data filed at 04/24/2020 1052 Gross per 24 hour  Intake 2958.25 ml  Output 0 ml  Net 2958.25 ml   Intake/Output: I/O last 3 completed shifts: In: 3418.3 [P.O.:3020; I.V.:398.3] Out: 0   Intake/Output this shift:  Total I/O In: 360 [P.O.:360] Out: -  Weight change: 3.6 kg Gen: NAD CVS: no rub Resp: cta Abd: benign Ext: no edema  Recent Labs  Lab 04/18/20 0807 04/18/20 0807 04/18/20 1240 04/19/20 0102 04/20/20 0200 04/20/20 1232 04/21/20 0338 04/23/20 0624 04/24/20 1036  NA 136  --  135 135 137  --  134* 137 139  K 5.3*  --  5.1 5.1 4.6  --  4.6 4.1 4.4  CL 100  --  101 100 97*  --  100 106 107  CO2 20*  --  18* 20* 22  --  20* 20* 22  GLUCOSE 133*  --  162* 139* 154*  --  168* 175* 294*  BUN 58*  --  66* 79* 108*  --  110* 107* 74*  CREATININE 5.17*   < > 5.58* 6.83* 9.05* 9.43* 8.72* 5.27* 3.54*  ALBUMIN 3.6  --  3.3* 2.9* 3.3*  --  2.9* 2.3* 2.5*  CALCIUM 9.1  --  8.9 8.7* 8.9  --  8.2* 8.5* 8.6*  PHOS  --   --   --  4.2  --   --   --  5.1* 3.8  AST 75*  --  74*  --  41  --  26  --  21  ALT 27  --  27  --  26  --  27  --  32   < > = values in this interval not displayed.   Liver Function Tests: Recent Labs  Lab 04/20/20 0200 04/20/20 0200 04/20/20 1232 04/21/20 0338 04/23/20 0624 04/24/20 1036  AST 41  --   --  26  --  21  ALT 26  --   --  27  --  32  ALKPHOS 71  --   --  72  --  88  BILITOT 2.3*   < > 1.4* 1.6*  --  0.9  PROT 7.5  --   --  6.7  --  7.1  ALBUMIN 3.3*   < >  --  2.9* 2.3* 2.5*   < > = values in this interval not displayed.   Recent Labs  Lab 04/20/20 0200  LIPASE 63*   No results for input(s): AMMONIA in the last  168 hours. CBC: Recent Labs  Lab 04/20/20 0200 04/20/20 0200 04/20/20 1232 04/20/20 1232 04/21/20 0613 04/23/20 0624 04/24/20 1036  WBC 15.7*   < > 15.0*   < > 14.3* 14.9* 17.0*  NEUTROABS 11.8*  --   --   --   --   --   --   HGB 15.0   < > 14.6   < > 13.9 12.4* 13.0  HCT 43.6   < > 43.1   < > 41.7 38.1* 39.3  MCV 95.2  --  95.4  --  96.1 95.0  96.3  PLT 185   < > 195   < > 201 240 267   < > = values in this interval not displayed.   Cardiac Enzymes: No results for input(s): CKTOTAL, CKMB, CKMBINDEX, TROPONINI in the last 168 hours. CBG: Recent Labs  Lab 04/17/20 2349 04/18/20 1726 04/18/20 2356 04/19/20 0748 04/24/20 1204  GLUCAP 140* 137* 150* 151* >600*    Iron Studies: No results for input(s): IRON, TIBC, TRANSFERRIN, FERRITIN in the last 72 hours. Studies/Results: No results found. Marland Kitchen amLODipine  10 mg Oral Daily  . heparin  5,000 Units Subcutaneous Q8H  . sodium chloride flush  3 mL Intravenous Q12H    BMET    Component Value Date/Time   NA 139 04/24/2020 1036   K 4.4 04/24/2020 1036   CL 107 04/24/2020 1036   CO2 22 04/24/2020 1036   GLUCOSE 294 (H) 04/24/2020 1036   BUN 74 (H) 04/24/2020 1036   CREATININE 3.54 (H) 04/24/2020 1036   CALCIUM 8.6 (L) 04/24/2020 1036   GFRNONAA 18 (L) 04/24/2020 1036   GFRAA 21 (L) 04/24/2020 1036   CBC    Component Value Date/Time   WBC 17.0 (H) 04/24/2020 1036   RBC 4.08 (L) 04/24/2020 1036   HGB 13.0 04/24/2020 1036   HCT 39.3 04/24/2020 1036   PLT 267 04/24/2020 1036   MCV 96.3 04/24/2020 1036   MCH 31.9 04/24/2020 1036   MCHC 33.1 04/24/2020 1036   RDW 14.1 04/24/2020 1036   LYMPHSABS 1.4 04/20/2020 0200   MONOABS 2.5 (H) 04/20/2020 0200   EOSABS 0.0 04/20/2020 0200   BASOSABS 0.0 04/20/2020 0200    Admitted 04/16/20 with hypertensive urgency and AKI/CKD [facility] then [facility] around [place] 9/18/21for dyspnea. Creatinine up to 9, K 4.6, bicarb 22. Agreeable to readmission.[facility] given likelihood hewill need dialysis.  Assessment/Plan:  1. AKI/CKD stage IIIa (unknown stage as his last Scr was 1.4 04/16/20) in setting of hypertensive urgency, IV contrast, and cocaine. Reports good UOP but is not collecting his urine. Despite elevated BUN/Cr he denies any uremic symptoms. We did discuss that he may need dialysis and will need to stop cocaine if he wants to delay the progression to ESRD 1. Scr continues to improve and is 3.54 today.  BUN also improved to 74. 2. No uremic symptoms 3. Continue to follow UOP and SCr 4. FeNa is <1% so will start IVF's and follow UOP and Scr. 5. Hopefully it will return to his baseline of 1.4 2. HTN urgency- + cocaine and was out of meds. Improved with home meds (except for lisiniopril due to AKI) 3. Pancreatitis- per primary 4. Dyspnea- improved 5. Cocaine abuse- discussed the need for abstinence 6. Disposition- should be able to be discharged tomorrow if his Scr continues to improve.   Irena Cords, MD [employer] [phone]

## 2020-04-24 NOTE — Plan of Care (Signed)
  Problem: Education: Goal: Knowledge of General Education information will improve Description Including pain rating scale, medication(s)/side effects and non-pharmacologic comfort measures Outcome: Progressing   

## 2020-04-25 DIAGNOSIS — R739 Hyperglycemia, unspecified: Secondary | ICD-10-CM | POA: Diagnosis present

## 2020-04-25 LAB — CBC
HCT: 37.7 % — ABNORMAL LOW (ref 39.0–52.0)
Hemoglobin: 12.6 g/dL — ABNORMAL LOW (ref 13.0–17.0)
MCH: 32.4 pg (ref 26.0–34.0)
MCHC: 33.4 g/dL (ref 30.0–36.0)
MCV: 96.9 fL (ref 80.0–100.0)
Platelets: 286 10*3/uL (ref 150–400)
RBC: 3.89 MIL/uL — ABNORMAL LOW (ref 4.22–5.81)
RDW: 14.3 % (ref 11.5–15.5)
WBC: 19.8 10*3/uL — ABNORMAL HIGH (ref 4.0–10.5)
nRBC: 0 % (ref 0.0–0.2)

## 2020-04-25 LAB — RENAL FUNCTION PANEL
Albumin: 2.4 g/dL — ABNORMAL LOW (ref 3.5–5.0)
Anion gap: 11 (ref 5–15)
BUN: 53 mg/dL — ABNORMAL HIGH (ref 6–20)
CO2: 18 mmol/L — ABNORMAL LOW (ref 22–32)
Calcium: 8.5 mg/dL — ABNORMAL LOW (ref 8.9–10.3)
Chloride: 110 mmol/L (ref 98–111)
Creatinine, Ser: 2.71 mg/dL — ABNORMAL HIGH (ref 0.61–1.24)
GFR calc Af Amer: 29 mL/min — ABNORMAL LOW (ref >60)
GFR calc non Af Amer: 25 mL/min — ABNORMAL LOW (ref >60)
Glucose, Bld: 223 mg/dL — ABNORMAL HIGH (ref 70–99)
Phosphorus: 3.7 mg/dL (ref 2.5–4.6)
Potassium: 4.8 mmol/L (ref 3.5–5.1)
Sodium: 139 mmol/L (ref 135–145)

## 2020-04-25 LAB — METANEPHRINES, PLASMA
Metanephrine, Free: 188.9 pg/mL — ABNORMAL HIGH (ref 0.0–88.0)
Normetanephrine, Free: 933.7 pg/mL — ABNORMAL HIGH (ref 0.0–136.8)

## 2020-04-25 LAB — GLUCOSE, CAPILLARY: Glucose-Capillary: 185 mg/dL — ABNORMAL HIGH (ref 70–99)

## 2020-04-25 MED ORDER — AMLODIPINE BESYLATE 10 MG PO TABS
10.0000 mg | ORAL_TABLET | Freq: Every day | ORAL | 0 refills | Status: DC
Start: 2020-04-25 — End: 2020-05-22

## 2020-04-25 MED ORDER — SODIUM CHLORIDE 0.9 % IV SOLN: INTRAVENOUS | Status: DC

## 2020-04-25 NOTE — Progress Notes (Signed)
Inpatient Diabetes Program Recommendations  AACE/ADA: New Consensus Statement on Inpatient Glycemic Control (2015)  Target Ranges:  Prepandial:   less than 140 mg/dL      Peak postprandial:   less than 180 mg/dL (1-2 hours)      Critically ill patients:  140 - 180 mg/dL   Lab Results  Component Value Date   GLUCAP 185 (H) 04/25/2020   HGBA1C 5.2 04/17/2020    Review of Glycemic Control  Diabetes history: None Outpatient Diabetes medications: None Current orders for Inpatient glycemic control: None  HgbA1C - 5.2% Now on renal diet.  Inpatient Diabetes Program Recommendations:     Add Novolog 0-9 units tidwc.  Will follow while inpatient.  Thank you. Ailene Ards, RD, LDN, CDE Inpatient Diabetes Coordinator [phone]

## 2020-04-25 NOTE — Progress Notes (Signed)
Patient ID: Ricky Boyle, male   DOB: [birth year], 59 y.o.   MRN: [id number] S: Feels well, no complaints. O:BP (!) 153/87 (BP Location: Right Arm)   Pulse 99   Temp 98.6 F (37 C) (Oral)   Resp 20   Ht 5\' 11"  (1.803 m)   Wt 125.1 kg   SpO2 100%   BMI 38.47 kg/m   Intake/Output Summary (Last 24 hours) at 04/25/2020 1219 Last data filed at 04/25/2020 04/27/2020 Gross per 24 hour  Intake 1715.77 ml  Output 0 ml  Net 1715.77 ml   Intake/Output: I/O last 3 completed shifts: In: 4534.2 [P.O.:2980; I.V.:1554.2] Out: 0   Intake/Output this shift:  No intake/output data recorded. Weight change: 0.003 kg Gen: NAD CVS: RRR, no rub Resp: cta Abd: benign  Ext: no edema  Recent Labs  Lab 04/18/20 1240 04/18/20 1240 04/19/20 0102 04/20/20 0200 04/20/20 1232 04/21/20 0338 04/23/20 0624 04/24/20 1036 04/25/20 1031  NA 135  --  135 137  --  134* 137 139 139  K 5.1  --  5.1 4.6  --  4.6 4.1 4.4 4.8  CL 101  --  100 97*  --  100 106 107 110  CO2 18*  --  20* 22  --  20* 20* 22 18*  GLUCOSE 162*  --  139* 154*  --  168* 175* 294* 223*  BUN 66*  --  79* 108*  --  110* 107* 74* 53*  CREATININE 5.58*   < > 6.83* 9.05* 9.43* 8.72* 5.27* 3.54* 2.71*  ALBUMIN 3.3*  --  2.9* 3.3*  --  2.9* 2.3* 2.5* 2.4*  CALCIUM 8.9  --  8.7* 8.9  --  8.2* 8.5* 8.6* 8.5*  PHOS  --   --  4.2  --   --   --  5.1* 3.8 3.7  AST 74*  --   --  41  --  26  --  21  --   ALT 27  --   --  26  --  27  --  32  --    < > = values in this interval not displayed.   Liver Function Tests: Recent Labs  Lab 04/20/20 0200 04/20/20 0200 04/20/20 1232 04/21/20 0338 04/21/20 0338 04/23/20 0624 04/24/20 1036 04/25/20 1031  AST 41  --   --  26  --   --  21  --   ALT 26  --   --  27  --   --  32  --   ALKPHOS 71  --   --  72  --   --  88  --   BILITOT 2.3*   < > 1.4* 1.6*  --   --  0.9  --   PROT 7.5  --   --  6.7  --   --  7.1  --   ALBUMIN 3.3*   < >  --  2.9*   < > 2.3* 2.5* 2.4*   < > = values in this interval not  displayed.   Recent Labs  Lab 04/20/20 0200  LIPASE 63*   No results for input(s): AMMONIA in the last 168 hours. CBC: Recent Labs  Lab 04/20/20 0200 04/20/20 0200 04/20/20 1232 04/20/20 1232 04/21/20 0613 04/21/20 0613 04/23/20 0624 04/24/20 1036 04/25/20 1031  WBC 15.7*   < > 15.0*   < > 14.3*   < > 14.9* 17.0* 19.8*  NEUTROABS 11.8*  --   --   --   --   --   --   --   --  HGB 15.0   < > 14.6   < > 13.9   < > 12.4* 13.0 12.6*  HCT 43.6   < > 43.1   < > 41.7   < > 38.1* 39.3 37.7*  MCV 95.2   < > 95.4  --  96.1  --  95.0 96.3 96.9  PLT 185   < > 195   < > 201   < > 240 267 286   < > = values in this interval not displayed.   Cardiac Enzymes: No results for input(s): CKTOTAL, CKMB, CKMBINDEX, TROPONINI in the last 168 hours. CBG: Recent Labs  Lab 04/24/20 1253 04/24/20 1622 04/24/20 1916 04/24/20 2129 04/25/20 0649  GLUCAP 379* 289* 261* 222* 185*    Iron Studies: No results for input(s): IRON, TIBC, TRANSFERRIN, FERRITIN in the last 72 hours. Studies/Results: No results found. Marland Kitchen amLODipine  10 mg Oral Daily  . heparin  5,000 Units Subcutaneous Q8H  . sodium chloride flush  3 mL Intravenous Q12H    BMET    Component Value Date/Time   NA 139 04/25/2020 1031   K 4.8 04/25/2020 1031   CL 110 04/25/2020 1031   CO2 18 (L) 04/25/2020 1031   GLUCOSE 223 (H) 04/25/2020 1031   BUN 53 (H) 04/25/2020 1031   CREATININE 2.71 (H) 04/25/2020 1031   CALCIUM 8.5 (L) 04/25/2020 1031   GFRNONAA 25 (L) 04/25/2020 1031   GFRAA 29 (L) 04/25/2020 1031   CBC    Component Value Date/Time   WBC 19.8 (H) 04/25/2020 1031   RBC 3.89 (L) 04/25/2020 1031   HGB 12.6 (L) 04/25/2020 1031   HCT 37.7 (L) 04/25/2020 1031   PLT 286 04/25/2020 1031   MCV 96.9 04/25/2020 1031   MCH 32.4 04/25/2020 1031   MCHC 33.4 04/25/2020 1031   RDW 14.3 04/25/2020 1031   LYMPHSABS 1.4 04/20/2020 0200   MONOABS 2.5 (H) 04/20/2020 0200   EOSABS 0.0 04/20/2020 0200   BASOSABS 0.0 04/20/2020  0200    Admitted 04/16/20 with hypertensive urgency and AKI/CKD but [facility] but then [facility] around [facility] 9/18/21for dyspnea. Creatinine up to 9, K 4.6, bicarb 22. Agreeable to readmission.[facility] likelihood hewill need dialysis.  Assessment/Plan:  1. AKI/CKDstage IIIa(unknown stage as his last Scr was 1.4 04/16/20) in setting of hypertensive urgency, IV contrast, and cocaine. Reports good UOP but is not collecting his urine. Despite elevated BUN/Cr he denies any uremic symptoms. We did discuss that he may need dialysis and will need to stop cocaine if he wants to delay the progression to ESRD 1. Scrcontinues to improve and is 3.54 today. BUN also improved to 74. 2. No uremic symptoms 3. Continue to follow UOP and SCr 4. FeNa is <1% so will start IVF's and follow UOP and Scr. 5. Hopefully it will return to his baseline of 1.4 6. Will arrange for outpatient follow up if his Scr does not return to baseline. 2. HTN urgency- + cocaine and was out of meds. Improved with home meds (except for lisiniopril due to AKI) 3. Pancreatitis- per primary 4. Dyspnea- improved 5. Cocaine abuse- discussed the need for abstinence 6. Disposition- stable for discharge and will need to f/u with his PCP in the next 1-2 weeks to recheck labs.   Irena Cords, MD [employer] [phone]

## 2020-04-25 NOTE — Progress Notes (Signed)
DISCHARGE NOTE HOME Ricky Boyle to be discharged Home per MD order. Discussed prescriptions and follow up appointments with the patient. Prescriptions given to patient; medication list explained in detail. Patient verbalized understanding.  Skin clean, dry and intact without evidence of skin break down, no evidence of skin tears noted. IV catheter discontinued intact. Site without signs and symptoms of complications. Dressing and pressure applied. Pt denies pain at the site currently. No complaints noted.  Patient free of lines, drains, and wounds.   An After Visit Summary (AVS) was printed and given to the patient. Patient escorted via wheelchair, and discharged home via private auto.  Selina Cooley BSN, RN3

## 2020-04-25 NOTE — Progress Notes (Signed)
° °  Subjective:   Mr. Doro states that he continues to feel well today. He denies any abdominal pain, fevers, chills. He states he is tolerating PO intake well and continues to pass gas. He denies CP, SOB or any other symptoms at this time. He is eager to go home as his daughter is present to pick him up.  Objective:  Vital signs in last 24 hours: Vitals:   04/24/20 2129 04/25/20 0539 04/25/20 0540 04/25/20 0900  BP: 128/60 128/82  (!) 153/87  Pulse: 97 95  99  Resp: 17 16  20   Temp: 98.3 F (36.8 C) 98.5 F (36.9 C)  98.6 F (37 C)  TempSrc:    Oral  SpO2: 96% 100%  100%  Weight: 125.1 kg     Height:   5\' 11"  (1.803 m)    General: Patient is obese and resting comfortably in NAD. Respiratory: Lungs are CTA, bilaterally. No wheezes, rales, or rhonchi.  Cardiovascular: Regular rate and rhythm. No murmurs, rubs, or gallops.  Abdominal: Distended but without tenderness to palpation, guarding or rebound. Bowel sounds intact.  Skin: No lesions. No rashes.   Assessment/Plan:  Principal Problem:   Acute kidney injury (HCC) Active Problems:   Acute pancreatitis   Cocaine use   Essential hypertension   Hyperglycemia  # Acute Pancreatitis # Severe Hyperglycemia  Patient was admitted for acute pancreatitis identified on CT imaging with initial amylase 9/15 of 590, lipase 33 > 63, triglycerides 224, ethanol < 10. Patient denies any fevers, chills, abdominal pain, or decreased PO intake, tolerating food well. Blood sugars remain elevated although continue to improve today. Hb A1c 04/17/20 was 5.2 and patient denies history of DM or glucose lowering agents at home.  - Will hold off on insulin or glucose lowering agents given hyperglycemia is improving and likely reactive to pancreatitis - Patient will need close outpatient glucose monitoring   # Improving Prerenal AKI  Creatinine initially ~9 this admission, with significant uremia, BUN > 100. Creatinine today improved to 2.71 and BUN  improved to 53. FENa < 1%. Etiology likely multifactorial, 2/2 HTN emergency (with overcorrection) possibly in the setting of recent cocaine use, and CTA chest, abdomen, pelvis last admission, with prerenal component, likely due to decreased PO intake in the setting of acute pancreatitis.  - Per nephrology, patient is okay for discharge today given improvement in renal function with outpatient follow up with PCP in 1-2 weeks; will arrange with our [facility] clinic  # Uncontrolled Hypertension Patient had HTN emergency on previous admission with BP as high as 240's systolic, controlled on oral medications after NTG drip. Intermittently well controlled this admission on amlodipine 10mg  daily alone.  - Plan to discharge with amlodipine 10mg  daily  - continue to hold HCTZ in setting of recent pancreatitis  - continue to hold lisinopril until creatinine stabilizes  # Polysubstance Use  Patient has a history of cocaine use and UDS positive for benzos.  - TOC has been consulted for cessation resources - Will need follow up in outpatient setting   Prior to Admission Living Arrangement: Home Anticipated Discharge Location: Home Barriers to Discharge: None Patient discharged this afternoon with PCP follow up in [facility] clinic.  [facility], MD 04/25/2020, 4:28 PM Pager: [phone] After 5pm on weekdays and 1pm on weekends: On Call pager [phone]

## 2020-04-25 NOTE — Discharge Summary (Signed)
Name: Ricky Boyle MRN: [id number] DOB: [birth year] 59 y.o. PCP: Patient, No Pcp Per  Date of Admission: 04/16/2020 10:44 AM Date of Discharge:(Patient left AMA)  04/19/2020 Attending Physician: No att. providers foundDr. Oswaldo Boyle  Discharge Diagnosis: 1. Principal Problem:   Acute pancreatitis Active Problems:   Asymptomatic hypertensive urgency   Cocaine use   Acute kidney injury [facility])    Discharge Medications: Allergies as of 04/19/2020      Reactions   Bee Venom Anaphylaxis   Shrimp [shellfish Allergy] Anaphylaxis      Medication List    ASK your doctor about these medications   amLODipine 10 MG tablet Commonly known as: NORVASC Take 1 tablet (10 mg total) by mouth daily.   amLODipine-benazepril 10-20 MG capsule Commonly known as: Lotrel Take 1 capsule by mouth daily.   hydrochlorothiazide 25 MG tablet Commonly known as: HYDRODIURIL Take 1 tablet (25 mg total) by mouth daily.   lisinopril 10 MG tablet Commonly known as: ZESTRIL Take 1 tablet (10 mg total) by mouth daily.       Disposition and follow-up:   [name] left [facility] AGAINST MEDICAL ADVICE.  At the hospital follow up visit please address: 1.  Acute pancreatitis: Please assess for improvement of symptoms, repeat lipase. 2.  Severe AKI: Repeat BMP for kidney function, assess for evidence of uremia or urgent HD needs.  Make sure he avoids nephrotoxins including NSAID.  Holding ACE and ARB's. Patient will need urgent/emergent evaluation and be seen by nephrologist for severe AKI.  2.  Hypertensive emergency: Please recheck blood pressure, ensure he is compliant with his antihypertensive medications. 3.  Consult service for cocaine use 2.  Labs / imaging needed at time of follow-up: Renal panel, volume status, lipase   Follow-up Appointments:   Hospital Course by problem list: 59 year old male with past medical history of HTN, cocaine use disorder, who presented with  abdominal pain and headache.  He found to have acute pancreatitis and was also significantly hypertensive. He was admitted for acute pancreatitis and hypertensive emergency.   #Acute pancreatitis: Patient presented with severe epigastric pain, nausea and vomiting for 2 days before arrival.  Lipase and TG were normal but CT scan showed evidence of pancreatitis.  Amylase was elevated at 590 and elevated lactic acid (3) on arrival.  No evidence of gallstone on ultrasound. Patient did not report alcohol use or any new medications.  Unclear etiology for acute pancreatitis.  He received supportive care and his symptoms improved.  Patient left AMA before complete resolution of symptoms and AGAINST MEDICAL ADVICE.   #Hypertension emergency: BP was at 200-250/110-158 on arrival.  Patient received nitro drip.  His systolic blood pressure dropped to 127/6 hours which was more than the goal.  He also had a CT a to rule out aortic dissection.  #Severe AKI/ATN, with hyperkalemia and metabolic acidosis: Creatinine increased acutely from 1.43 on admission to 5.17 >> 5.58.  Multifactorial, ATN in setting of lisinopril use, questionable to all RAAS blockade (he was out of his medication and took his sister's meds), also in setting of contrast he was in hospital for CTA, also probably in setting of quick drop of blood pressure after admission.  Renal ultrasound did not show hydronephrosis, obstruction.  Had normal-appearing kidneys.  Doppler ultrasound showed normal perfusion. Nephrology was on board.  Patient received bicarbonate supplement, IV fluid and supportive care.  He did not required urgent hemodialysis but given high risk for progression of kidney disease, and  eventual need for HD, patient was highly recommended to stay in hospital for monitoring kidney function and catheter placement for future HD if needed.  Patient declined catheter placement and left the hospital AGAINST MEDICAL ADVICE and despite being  informed about his high risk clinical status.   Discharge Vitals:   BP 128/78 (BP Location: Right Arm)   Pulse (!) 134   Temp 98.8 F (37.1 C) (Oral)   Resp (!) 24   Wt 117.9 kg   SpO2 100%   BMI 41.97 kg/m   Pertinent Labs, Studies, and Procedures:   Component     Latest Ref Rng & Units 04/18/2020 04/19/2020        12:40 PM   Sodium     135 - 145 mmol/L 135 135  Potassium     3.5 - 5.1 mmol/L 5.1 5.1  Chloride     98 - 111 mmol/L 101 100  CO2     22 - 32 mmol/L 18 (L) 20 (L)  Glucose     70 - 99 mg/dL 478 (H) 295 (H)  BUN     6 - 20 mg/dL 66 (H) 79 (H)  Creatinine     0.61 - 1.24 mg/dL 6.21 (H) 3.08 (H)  Calcium     8.9 - 10.3 mg/dL 8.9 8.7 (L)  Phosphorus     2.5 - 4.6 mg/dL  4.2  Albumin     3.5 - 5.0 g/dL 3.3 (L) 2.9 (L)  GFR, Est Non African American     >60 mL/min 10 (L) 8 (L)  GFR, Est African American     >60 mL/min 12 (L) 9 (L)  Anion gap     5 - 15 16 (H) 15   Discharge Instructions:   SignedChevis Pretty, MD 04/25/2020, 2:06 PM   Pager: [phone]

## 2020-04-26 NOTE — Discharge Summary (Signed)
Name: Ricky Boyle MRN: [id number] DOB: 11/17/1960 59 y.o. PCP: Patient, No Pcp Per  Date of Admission: 04/19/2020 11:44 PM Date of Discharge: 04/25/2020 Attending Physician: Dr. Sandre Boyle  Discharge Diagnosis: 1. Principal Problem:   Acute kidney injury (HCC) Active Problems:   Acute pancreatitis   Cocaine use   Essential hypertension   Hyperglycemia  1. Acute Pancreatitis  2. Acute Kidney Injury 3. Uncontrolled Hypertension 4. Polysubstance Use Disorder  5. Hyperglycemia   Discharge Medications: Allergies as of 04/25/2020      Reactions   Bee Venom Anaphylaxis   Shrimp [shellfish Allergy] Anaphylaxis      Medication List    STOP taking these medications   amLODipine-benazepril 10-20 MG capsule Commonly known as: Lotrel   hydrochlorothiazide 25 MG tablet Commonly known as: HYDRODIURIL   lisinopril 10 MG tablet Commonly known as: ZESTRIL     TAKE these medications   amLODipine 10 MG tablet Commonly known as: NORVASC Take 1 tablet (10 mg total) by mouth daily.       Disposition and follow-up:   [name] was discharged from [facility] in Stable condition.  At the hospital follow up visit please address:  1.   A. Acute Pancreatitis - Triglycerides elevated to 224 and patient would benefit from outpatient lipid panel testing. Patient developed what was thought to be reactive hyperglycemia and would benefit from outpatient blood glucose monitoring.   B. Uncontrolled Hypertension - Blood pressures elevated on recent admission in the 200's systolic, which worsened after beta-blocker, consistent with recent cocaine use as noted on UDS. Blood pressures this admission were intermittently well controlled this admission on amlodipine 10mg  daily alone and patient was discharged with this. Home HCTZ was held 2/2 pancreatitis and lisinopril was held 2/2 acute kidney injury, although patient would likely benefit from these added back as tolerated.   C.  Polysubstance Use Disorder - Patient endorses recent cocaine use and UDS positive for cocaine and benzos. Patient would benefit from continued motivational interviewing techniques.   D. Acute Hyperglycemia - Patient's Hb A1c 04/17/20 was 5.2, although he developed significant hyperglycemia into 300's during admission, thought to be reactive to pancreatitis. Will need close follow up.  E. Acute Kidney Injury - Severely elevated Creatinine / BUN on admission (likely 2/2 cocaine use, HTN rapidly corrected, and contrast for CTA previous admission) drastically improved prior to discharge, with baseline creatinine ~1.4. Patient educated on cocaine as likely contributing factor and will require close follow up.   2.  Labs / imaging needed at time of follow-up: CBC, CMP, consider CBG monitoring if BG elevated  3.  Pending labs/ test needing follow-up: None   Follow-up Appointments: Message sent to [facility] and patient notified he will receive call to schedule an appointment within the next week for follow up of blood pressures, blood sugars, pancreatitis, and also kidney function.   Hospital Course by problem list:  1. Acute Pancreatitis Patient was just recently admitted here for abdominal pain, decreased PO intake, nausea and vomiting and was found to have findings of acute pancreatitis on CT scan with initial amylase 590 and lipase 33. He decided to leave AMA after developing AKI, told he may require dialysis, and went to [facility]; however, patient was again transferred to [facility]. On this admission, his abdominal pain slowly improved and his nausea, vomiting, and decreased appetite resolved. The cause of his pancreatitis remains unclear, with only mildly elevated triglycerides, negative ETOH, although patient does use cocaine. His  pancreatitis was complicated by elevated blood sugars. Hb A1c was 5.2 on 04/17/20 and patient denied history of DM, although sugars elevated to >600, 300's during  admission. Blood sugars gradually improved without insulin, thought to be reactive to pancreatitis although will require close follow up. Pain stable for discharge on regular diet without pain medication.   2. Acute Kidney Injury  3. Uncontrolled Hypertension  On previous admission, patient developed acute, severe elevation in creatinine from around baseline to ~9 with BUN > 100. He continued to make urine without uremic symptoms, but left AMA when told he may need dialysis. This admission, his kidney function gradually improved to 2.71 and BUN imrpoved to 53. FENa < 1% consistent with prerenal AKI, likely due to overcorrected hypertension on previous admission with recent cocaine use per UDS and CTA chest, abdomen, pelvis prior admission requiring contrast. Reported baseline creatinine ~1.4.  Blood pressure was intermittently well controlled during admission on amlodipine 10mg  daily alone. HCTZ was held in the setting of pancreatitis and lisinopril was held given AKI.  4. Polysubstance Use Disorder Patient endorses frequent use of cocaine and UDS also positive for Benzodiazepines. Patient was educated that HTN and kidney function may worsen with use of cocaine and TOC was consulted for cessation resources. Patient did not have signs of withdrawal while in the hospital.  Discharge Vitals:   BP (!) 153/87 (BP Location: Right Arm)   Pulse 99   Temp 98.6 F (37 C) (Oral)   Resp 20   Ht 5\' 11"  (1.803 m)   Wt 125.1 kg   SpO2 100%   BMI 38.47 kg/m   Pertinent Labs, Studies, and Procedures:   Labs:  Creatinine previous admission: 9.43 --> 2.71 upon this discharge  BUN 110 --> 53 on discharge Glucose 23 on discharge  WBC 19.8 on discharge  Hemoglobin 12.6 on discharge  Triglycerides 224 HDL 17, VLDL 45  UDS + cocaine, benzos  ETOH < 10  No new imaging was obtained this admission given recent admission for same.    Discharge Instructions: Discharge Instructions    Call MD for:   persistant dizziness or light-headedness   Complete by: As directed    Call MD for:  persistant nausea and vomiting   Complete by: As directed    Call MD for:  severe uncontrolled pain   Complete by: As directed    Call MD for:  temperature >100.4   Complete by: As directed    Diet - low sodium heart healthy   Complete by: As directed    Diet Carb Modified   Complete by: As directed    Discharge instructions   Complete by: As directed    Mr. Clive, Parcel were admitted to the hospital with severely elevated blood pressure recently and were found to have acute pancreatitis. You improved with IV fluids. You were also found to have severe injury to your kidneys, likely due to a combination of your elevated blood pressures, history of cocaine use, and contrast from your imaging studies last hospital visit.   Your kidney function has greatly improved. Your high blood sugars are likely due to inflammation from your pancreatitis.   I have ordered amlodipine to your [facility] pharmacy. Please take one tablet daily.  Please STOP taking your other blood pressure medications (hydrochlorothiazide and lisinopril) listed above for now.   I have reached out to our [facility] clinic here on the ground floor of the hospital for an appointment. You should expect a call  from them for a visit within the next week or two to follow up your sugars, blood pressure and pain. Please call at [phone] if you do not hear from them.   Thank you and we hope you continue to feel better!   Increase activity slowly   Complete by: As directed       Signed: Glenford Bayley, MD 04/26/2020, 10:50 PM   Pager: [phone]

## 2020-04-28 ENCOUNTER — Telehealth: Payer: Self-pay | Admitting: General Practice

## 2020-04-28 NOTE — Telephone Encounter (Signed)
-----   Message from Glenford Bayley, MD sent at 04/26/2020 10:15 PM EDT ----- Regarding: Appointment Needed Hello,  I recently discharged Ricky Boyle from [facility] hospital and he does not have a PCP. Could we please schedule him an appointment in [facility] within the next week for follow up of pancreatitis, blood sugars, blood pressure?   Thank you!  Glenford Bayley, PGY1  [phone]

## 2020-04-28 NOTE — Telephone Encounter (Signed)
Called the patient at the request of Dr. Laddie Aquas to schedule a HFU appointment.  Patient agreed to an appointment for next Monday 05/05/2020 at 8:45 am.  Forwarding back to Dr. Laddie Aquas to make her aware.

## 2020-04-30 ENCOUNTER — Encounter (HOSPITAL_COMMUNITY): Payer: Self-pay | Admitting: Emergency Medicine

## 2020-04-30 ENCOUNTER — Telehealth: Payer: Self-pay | Admitting: Student

## 2020-04-30 ENCOUNTER — Emergency Department (HOSPITAL_COMMUNITY)
Admission: EM | Admit: 2020-04-30 | Discharge: 2020-04-30 | Disposition: A | Discharge status: Home / Self Care | Payer: BLUE CROSS/BLUE SHIELD | Source: Home / Self Care

## 2020-04-30 ENCOUNTER — Other Ambulatory Visit: Payer: Self-pay

## 2020-04-30 ENCOUNTER — Emergency Department (HOSPITAL_COMMUNITY): Payer: BLUE CROSS/BLUE SHIELD

## 2020-04-30 DIAGNOSIS — K863 Pseudocyst of pancreas: Secondary | ICD-10-CM | POA: Diagnosis not present

## 2020-04-30 DIAGNOSIS — Z5321 Procedure and treatment not carried out due to patient leaving prior to being seen by health care provider: Secondary | ICD-10-CM | POA: Insufficient documentation

## 2020-04-30 DIAGNOSIS — R739 Hyperglycemia, unspecified: Secondary | ICD-10-CM | POA: Insufficient documentation

## 2020-04-30 DIAGNOSIS — K8689 Other specified diseases of pancreas: Secondary | ICD-10-CM | POA: Diagnosis not present

## 2020-04-30 HISTORY — DX: Acute kidney failure, unspecified: N17.9

## 2020-04-30 LAB — CBG MONITORING, ED: Glucose-Capillary: >600 mg/dL (ref 70–99)

## 2020-04-30 MED ORDER — ACETAMINOPHEN 325 MG PO TABS
650.0000 mg | ORAL_TABLET | Freq: Once | ORAL | Status: AC
  Administered 2020-04-30: 650 mg via ORAL
  Filled 2020-04-30: qty 2

## 2020-04-30 NOTE — ED Notes (Signed)
Called for Pt to recheck Vitals. No answer. Pt was seen going outside but at this time we have not located Pt inside or out.

## 2020-04-30 NOTE — Telephone Encounter (Signed)
TOC APPT [facility] WITH DR [facility] ON 05/05/2020 @ 8:45 AM

## 2020-04-30 NOTE — ED Triage Notes (Signed)
Patient arrived with EMS from home reports thirsty/polydipsia with fatigue this week , CBG = 600+ by EMS , febrile at triage .

## 2020-04-30 NOTE — ED Notes (Signed)
Pt was called for xray, no answer. Pt was called on the phone, no answer.

## 2020-04-30 NOTE — ED Notes (Signed)
Unable to get blood in triage, RN notified

## 2020-04-30 NOTE — ED Notes (Signed)
Called for Pt to recheck vitals. Still no answer

## 2020-05-01 NOTE — Telephone Encounter (Signed)
Called patient for TOC. States he is not feeling well since leaving hospital on 04/25/2020. Went to ED 2 nights ago and left w/o being seen after waiting 7 hours. HFU r/s from 05/05/2020 to tomorrow with Dr. Arther Dames. Instructed patient to bring all med bottles with him. Also gave instructions to stop at [facility] entrance and asked to be brought to clinic via w/c. Patient was very Adult nurse. Kinnie Feil, BSN, RN-BC

## 2020-05-01 NOTE — Telephone Encounter (Signed)
Attempted to call patient to discuss lab findings of elevated metanephrines in the setting of recent HTN, pancreatitis and hyperglycemia from recent admission. Patient informed [facility] nursing staff that he has not felt well; however, patient unable to be contacted for discussion. Patient has appointment with me in the morning and will discuss need for further workup at that time.   Glenford Bayley, PGY1  [phone]

## 2020-05-02 ENCOUNTER — Emergency Department (HOSPITAL_COMMUNITY): Payer: BLUE CROSS/BLUE SHIELD

## 2020-05-02 ENCOUNTER — Other Ambulatory Visit: Payer: Self-pay

## 2020-05-02 ENCOUNTER — Encounter (HOSPITAL_COMMUNITY): Payer: Self-pay | Admitting: Emergency Medicine

## 2020-05-02 ENCOUNTER — Encounter: Payer: Self-pay | Admitting: Student

## 2020-05-02 ENCOUNTER — Ambulatory Visit (INDEPENDENT_AMBULATORY_CARE_PROVIDER_SITE_OTHER): Payer: BLUE CROSS/BLUE SHIELD | Admitting: Student

## 2020-05-02 ENCOUNTER — Encounter (HOSPITAL_COMMUNITY): Payer: Self-pay | Admitting: Student in an Organized Health Care Education/Training Program

## 2020-05-02 ENCOUNTER — Inpatient Hospital Stay (HOSPITAL_COMMUNITY)
Admission: EM | Admit: 2020-05-02 | Discharge: 2020-05-04 | DRG: 438 | Disposition: A | Discharge status: Home / Self Care | Payer: BLUE CROSS/BLUE SHIELD | Attending: Student in an Organized Health Care Education/Training Program | Admitting: Student in an Organized Health Care Education/Training Program

## 2020-05-02 VITALS — BP 137/86 | HR 104 | Temp 98.4°F | Ht 71.0 in | Wt 252.6 lb

## 2020-05-02 DIAGNOSIS — Z91013 Allergy to seafood: Secondary | ICD-10-CM | POA: Diagnosis not present

## 2020-05-02 DIAGNOSIS — Z91030 Bee allergy status: Secondary | ICD-10-CM | POA: Diagnosis not present

## 2020-05-02 DIAGNOSIS — R739 Hyperglycemia, unspecified: Secondary | ICD-10-CM

## 2020-05-02 DIAGNOSIS — R300 Dysuria: Secondary | ICD-10-CM | POA: Diagnosis present

## 2020-05-02 DIAGNOSIS — I700 Atherosclerosis of aorta: Secondary | ICD-10-CM | POA: Diagnosis present

## 2020-05-02 DIAGNOSIS — E875 Hyperkalemia: Secondary | ICD-10-CM | POA: Diagnosis present

## 2020-05-02 DIAGNOSIS — E11649 Type 2 diabetes mellitus with hypoglycemia without coma: Secondary | ICD-10-CM | POA: Diagnosis not present

## 2020-05-02 DIAGNOSIS — I129 Hypertensive chronic kidney disease with stage 1 through stage 4 chronic kidney disease, or unspecified chronic kidney disease: Secondary | ICD-10-CM | POA: Diagnosis not present

## 2020-05-02 DIAGNOSIS — Z8249 Family history of ischemic heart disease and other diseases of the circulatory system: Secondary | ICD-10-CM

## 2020-05-02 DIAGNOSIS — R1084 Generalized abdominal pain: Secondary | ICD-10-CM

## 2020-05-02 DIAGNOSIS — E1165 Type 2 diabetes mellitus with hyperglycemia: Secondary | ICD-10-CM | POA: Diagnosis present

## 2020-05-02 DIAGNOSIS — I10 Essential (primary) hypertension: Secondary | ICD-10-CM

## 2020-05-02 DIAGNOSIS — Z6841 Body Mass Index (BMI) 40.0 and over, adult: Secondary | ICD-10-CM | POA: Diagnosis not present

## 2020-05-02 DIAGNOSIS — K859 Acute pancreatitis without necrosis or infection, unspecified: Secondary | ICD-10-CM

## 2020-05-02 DIAGNOSIS — N179 Acute kidney failure, unspecified: Secondary | ICD-10-CM | POA: Diagnosis present

## 2020-05-02 DIAGNOSIS — E669 Obesity, unspecified: Secondary | ICD-10-CM | POA: Diagnosis present

## 2020-05-02 DIAGNOSIS — Z79899 Other long term (current) drug therapy: Secondary | ICD-10-CM | POA: Diagnosis not present

## 2020-05-02 DIAGNOSIS — Z20822 Contact with and (suspected) exposure to covid-19: Secondary | ICD-10-CM | POA: Diagnosis present

## 2020-05-02 DIAGNOSIS — F17210 Nicotine dependence, cigarettes, uncomplicated: Secondary | ICD-10-CM | POA: Diagnosis present

## 2020-05-02 DIAGNOSIS — K863 Pseudocyst of pancreas: Principal | ICD-10-CM | POA: Diagnosis present

## 2020-05-02 DIAGNOSIS — E089 Diabetes mellitus due to underlying condition without complications: Secondary | ICD-10-CM

## 2020-05-02 DIAGNOSIS — IMO0002 Reserved for concepts with insufficient information to code with codable children: Secondary | ICD-10-CM | POA: Diagnosis present

## 2020-05-02 DIAGNOSIS — K8689 Other specified diseases of pancreas: Secondary | ICD-10-CM | POA: Diagnosis present

## 2020-05-02 DIAGNOSIS — N189 Chronic kidney disease, unspecified: Secondary | ICD-10-CM | POA: Diagnosis not present

## 2020-05-02 DIAGNOSIS — R947 Abnormal results of other endocrine function studies: Secondary | ICD-10-CM | POA: Diagnosis not present

## 2020-05-02 DIAGNOSIS — K0889 Other specified disorders of teeth and supporting structures: Secondary | ICD-10-CM | POA: Diagnosis not present

## 2020-05-02 HISTORY — DX: Reserved for concepts with insufficient information to code with codable children: IMO0002

## 2020-05-02 HISTORY — DX: Diabetes mellitus due to underlying condition without complications: E08.9

## 2020-05-02 HISTORY — DX: Acute pancreatitis without necrosis or infection, unspecified: K85.90

## 2020-05-02 LAB — COMPREHENSIVE METABOLIC PANEL
ALT: 25 U/L (ref 0–44)
AST: 15 U/L (ref 15–41)
Albumin: 2.8 g/dL — ABNORMAL LOW (ref 3.5–5.0)
Alkaline Phosphatase: 94 U/L (ref 38–126)
Anion gap: 14 (ref 5–15)
BUN: 51 mg/dL — ABNORMAL HIGH (ref 6–20)
CO2: 20 mmol/L — ABNORMAL LOW (ref 22–32)
Calcium: 9.1 mg/dL (ref 8.9–10.3)
Chloride: 93 mmol/L — ABNORMAL LOW (ref 98–111)
Creatinine, Ser: 2.04 mg/dL — ABNORMAL HIGH (ref 0.61–1.24)
GFR calc Af Amer: 40 mL/min — ABNORMAL LOW (ref >60)
GFR calc non Af Amer: 35 mL/min — ABNORMAL LOW (ref >60)
Glucose, Bld: 538 mg/dL (ref 70–99)
Potassium: 5.3 mmol/L — ABNORMAL HIGH (ref 3.5–5.1)
Sodium: 127 mmol/L — ABNORMAL LOW (ref 135–145)
Total Bilirubin: 0.7 mg/dL (ref 0.3–1.2)
Total Protein: 7.8 g/dL (ref 6.5–8.1)

## 2020-05-02 LAB — URINALYSIS, ROUTINE W REFLEX MICROSCOPIC
Bacteria, UA: NONE SEEN
Bilirubin Urine: NEGATIVE
Glucose, UA: >=500 mg/dL — AB
Hgb urine dipstick: NEGATIVE
Ketones, ur: NEGATIVE mg/dL
Leukocytes,Ua: NEGATIVE
Nitrite: NEGATIVE
Protein, ur: NEGATIVE mg/dL
Specific Gravity, Urine: 1.013 (ref 1.005–1.030)
pH: 5 (ref 5.0–8.0)

## 2020-05-02 LAB — CBC
HCT: 41.5 % (ref 39.0–52.0)
Hemoglobin: 13.7 g/dL (ref 13.0–17.0)
MCH: 30.9 pg (ref 26.0–34.0)
MCHC: 33 g/dL (ref 30.0–36.0)
MCV: 93.5 fL (ref 80.0–100.0)
Platelets: 317 10*3/uL (ref 150–400)
RBC: 4.44 MIL/uL (ref 4.22–5.81)
RDW: 12.9 % (ref 11.5–15.5)
WBC: 14.6 10*3/uL — ABNORMAL HIGH (ref 4.0–10.5)
nRBC: 0 % (ref 0.0–0.2)

## 2020-05-02 LAB — CBG MONITORING, ED
Glucose-Capillary: 325 mg/dL — ABNORMAL HIGH (ref 70–99)
Glucose-Capillary: 349 mg/dL — ABNORMAL HIGH (ref 70–99)
Glucose-Capillary: 386 mg/dL — ABNORMAL HIGH (ref 70–99)
Glucose-Capillary: 454 mg/dL — ABNORMAL HIGH (ref 70–99)

## 2020-05-02 LAB — GLUCOSE, CAPILLARY: Glucose-Capillary: 553 mg/dL (ref 70–99)

## 2020-05-02 LAB — RESPIRATORY PANEL BY RT PCR (FLU A&B, COVID)
Influenza A by PCR: NEGATIVE
Influenza B by PCR: NEGATIVE
SARS Coronavirus 2 by RT PCR: NEGATIVE

## 2020-05-02 LAB — LIPASE, BLOOD: Lipase: 30 U/L (ref 11–51)

## 2020-05-02 MED ORDER — HYDROMORPHONE HCL 1 MG/ML IJ SOLN
0.5000 mg | INTRAMUSCULAR | Status: DC | PRN
  Administered 2020-05-02 – 2020-05-04 (×5): 0.5 mg via INTRAVENOUS
  Filled 2020-05-02 (×2): qty 1
  Filled 2020-05-02 (×2): qty 0.5
  Filled 2020-05-02: qty 1

## 2020-05-02 MED ORDER — INSULIN ASPART 100 UNIT/ML ~~LOC~~ SOLN
4.0000 [IU] | Freq: Once | SUBCUTANEOUS | Status: AC
  Administered 2020-05-02: 4 [IU] via INTRAVENOUS

## 2020-05-02 MED ORDER — SODIUM ZIRCONIUM CYCLOSILICATE 10 G PO PACK
10.0000 g | PACK | Freq: Once | ORAL | Status: AC
  Administered 2020-05-02: 10 g via ORAL
  Filled 2020-05-02: qty 1

## 2020-05-02 MED ORDER — INSULIN ASPART PROT & ASPART (70-30 MIX) 100 UNIT/ML ~~LOC~~ SUSP
2.0000 [IU] | Freq: Once | SUBCUTANEOUS | Status: DC
  Filled 2020-05-02: qty 10

## 2020-05-02 MED ORDER — FENTANYL CITRATE (PF) 100 MCG/2ML IJ SOLN
50.0000 ug | Freq: Once | INTRAMUSCULAR | Status: AC
  Administered 2020-05-02: 50 ug via INTRAVENOUS
  Filled 2020-05-02: qty 2

## 2020-05-02 MED ORDER — IOHEXOL 300 MG/ML  SOLN
80.0000 mL | Freq: Once | INTRAMUSCULAR | Status: AC | PRN
  Administered 2020-05-02: 80 mL via INTRAVENOUS

## 2020-05-02 MED ORDER — SODIUM CHLORIDE 0.9 % IV SOLN: INTRAVENOUS | Status: DC

## 2020-05-02 MED ORDER — INSULIN ASPART 100 UNIT/ML ~~LOC~~ SOLN
2.0000 [IU] | Freq: Once | SUBCUTANEOUS | Status: AC
  Administered 2020-05-02: 2 [IU] via INTRAVENOUS

## 2020-05-02 MED ORDER — SODIUM CHLORIDE 0.9 % IV BOLUS
1000.0000 mL | Freq: Once | INTRAVENOUS | Status: AC
  Administered 2020-05-02: 1000 mL via INTRAVENOUS

## 2020-05-02 MED ORDER — ONDANSETRON HCL 4 MG/2ML IJ SOLN: 4.0000 mg | Freq: Four times a day (QID) | INTRAMUSCULAR | Status: DC | PRN

## 2020-05-02 MED ORDER — ACETAMINOPHEN 325 MG PO TABS: 650.0000 mg | ORAL_TABLET | Freq: Four times a day (QID) | ORAL | Status: DC | PRN

## 2020-05-02 MED ORDER — ONDANSETRON HCL 4 MG PO TABS: 4.0000 mg | ORAL_TABLET | Freq: Four times a day (QID) | ORAL | Status: DC | PRN

## 2020-05-02 MED ORDER — ACETAMINOPHEN 650 MG RE SUPP: 650.0000 mg | Freq: Four times a day (QID) | RECTAL | Status: DC | PRN

## 2020-05-02 MED ORDER — INSULIN ASPART 100 UNIT/ML ~~LOC~~ SOLN
0.0000 [IU] | SUBCUTANEOUS | Status: DC
  Administered 2020-05-02 – 2020-05-03 (×3): 11 [IU] via SUBCUTANEOUS
  Administered 2020-05-03: 3 [IU] via SUBCUTANEOUS
  Administered 2020-05-03: 2 [IU] via SUBCUTANEOUS

## 2020-05-02 NOTE — H&P (Addendum)
Date: 05/02/2020               Patient Name:  Ricky Boyle MRN: [id number]  DOB: 10/02/1960 Age / Sex: 59 y.o., male   PCP: Patient, No Pcp Per         Medical Service: Internal Medicine Teaching Service         Attending Physician: Dr. Oswaldo Done, Marquita Palms, *    First Contact: Dr. Morrie Sheldon Pager: [phone]  Second Contact: Dr. Barbaraann Faster Pager: [phone]       After Hours (After 5p/  First Contact Pager: [phone]  weekends / holidays): Second Contact Pager: [phone]   Chief Complaint: generalized weakness, abdominal pain  History of Present Illness:   Patient presents with hx of HTN, cocaine use, recurrent pancreatitis with recent admission for this on 9/15 presenting with generalized weakness, fatigue, and abdominal pain since he was last discharged. Was seen in clinic this morning and also complained of subjective fevers/chills for 1 day though he denied this to me. Requires Miralax for BMs. He states his pain "rotates" around his abdomen and affects many different areas, does not go through his back. Noted an episode of blurry vision. He is also noted to have hyperglycemia to 500s, no Hx of DM, endorses polyuria and polydipsia. Denies nausea, vomiting, syncope.  Meds:  Current Outpatient Medications  Medication Instructions  . amLODipine (NORVASC) 10 mg, Oral, Daily  . Multiple Vitamins-Minerals (MULTIVITAMIN WITH MINERALS) tablet 1 tablet, Oral, Daily  . nicotine (NICODERM CQ - DOSED IN MG/24 HOURS) 21 mg, Transdermal, Daily   Allergies: Allergies as of 05/02/2020 - Review Complete 05/02/2020  Allergen Reaction Noted  . Bee venom Anaphylaxis 09/22/2013  . Shrimp [shellfish allergy] Anaphylaxis 09/22/2013   Past Medical History:  Diagnosis Date  . AKI (acute kidney injury) (HCC)   . Cocaine abuse (HCC)   . Hypertension   . Opioid abuse (HCC)     Family History:  Family History  Problem Relation Age of Onset  . Hypertension Mother   . Hypertension Sister   . Cancer  Neg Hx   . Heart attack Neg Hx     Social History:  Social History   Tobacco Use  . Smoking status: Current Every Day Smoker    Packs/day: 0.50    Years: 25.00    Pack years: 12.50    Types: Cigarettes  . Smokeless tobacco: Never Used  Substance Use Topics  . Alcohol use: No  . Drug use: No    Review of Systems: Review of Systems  Constitutional: Positive for chills, diaphoresis and fever (subjective).  HENT: Negative for congestion and sore throat.   Eyes: Positive for blurred vision.  Respiratory: Negative for cough and shortness of breath.   Cardiovascular: Negative for chest pain and palpitations.  Gastrointestinal: Positive for abdominal pain and constipation. Negative for blood in stool, diarrhea, melena, nausea and vomiting.  Genitourinary: Negative for dysuria and urgency.  Musculoskeletal: Negative for back pain and neck pain.  Skin: Negative for itching and rash.  Neurological: Positive for dizziness (lightheaded if getting up quickly) and weakness. Negative for loss of consciousness.  Endo/Heme/Allergies: Positive for polydipsia.     Physical Exam: Physical Exam Vitals and nursing note reviewed.  Constitutional:      General: He is not in acute distress.    Appearance: He is obese. He is ill-appearing.  HENT:     Head: Normocephalic and atraumatic.     Comments: Small area of swelling above his  R lip which is moderately tender, difficult to visualize intra-oral area, no clear fluctuance or drainage Poor dentition    Mouth/Throat:     Mouth: Mucous membranes are moist.     Pharynx: Oropharynx is clear. No pharyngeal swelling or oropharyngeal exudate.  Eyes:     Extraocular Movements: Extraocular movements intact.  Cardiovascular:     Rate and Rhythm: Normal rate and regular rhythm.     Heart sounds: Normal heart sounds.  Pulmonary:     Effort: Pulmonary effort is normal. No respiratory distress.     Breath sounds: Normal breath sounds. No wheezing,  rhonchi or rales.  Abdominal:     General: Abdomen is protuberant. Bowel sounds are decreased.     Palpations: Abdomen is soft.     Tenderness: There is abdominal tenderness (mild) in the epigastric area.  Skin:    General: Skin is warm and dry.  Neurological:     General: No focal deficit present.     Mental Status: He is alert.  Psychiatric:        Mood and Affect: Mood normal.    EKG: personally reviewed my interpretation is NSR with J point elevations in V1 V2 V3 CT abdomen: subtotal pancreatic necrosis with 2 pseudo-cysts Lipase 30 Initial blood sugar 538  Assessment & Plan by Problem: Active Problems:   Acute kidney injury (HCC)   Essential hypertension   Hyperglycemia   Pancreatitis, recurrent   Ricky Boyle is a 59 y.o. year old male with hx of HTN, cocaine and benzo use, recurrent pancreatitis  presenting for generalized weakness and abdominal pain, CT scan with new pancreatic necrosis and pseudo-cyst x2. Hemodynamically stable.  Pancreatic necrosis and pseudo-cysts Patient with history of recurrent pancreatitis, was last admitted for this on 9/15, did well with bowel rest, IV fluids, and pain control. Etiology unclear (denies alcohol use, TG normal, RUQ [place] without gallstones), idiopathic vs 2/2 cocaine use. Lipase 30 this admission, though he does not normally have large lipase elevation but previously has had imaging consistent with pancreatitis. Now presenting with fatigue and abd pain, CT scan with new subtotal pancreatic necrosis and 2 pseudo-cysts. Clinically stable, white count improving, subjective fevers but none recorded this admission.  -made NPO, but became upset and was very insistent on eating. He recently left AMA so given flight risk, allowed clear liquids tonight and NPO after midnight -pain control with dilaudid -normal saline at 150cc/hr -holding off on Abx for now given clinical stability, no fevers recorded, no gas on imaging -consider MRCP to r/o  gallstone induced, though negative RUQ [place] previously -IR consulted, will see in AM -GI consulted, will see in AM  Hyperglycemia in the setting of pancreatitis Blood sugar of 538 on admission, improving with insulin. Not in DKA. Denies any hx of DM. A1c of 5.2 on 9/16.  -continue SSI -f/u A1c  HTN  Elevated urine metanephrines Started on Amlodipine recently. BP 150s/90s so far this admission, though recently had admission with hypertensive emergency in setting of cocaine use. States he has not used cocaine in past 2 weeks.  Serum metanephrines elevated at previous admission. No known FHx of cancer. CT scan without adrenal abnormality. Has episodes of headaches and sweating, but reports only when he's run out of his BP meds. -holding home amlodipine while NPO -history not totally consistent with pheochromocytoma. Monitor additional symptoms and consider further imaging such as iobenguane I-123 or FDG-PET scan  Resolving AKI vs CKD Had AKI on recent admission with creatinine as  high as 9.43 secondary to overly rapid correction of hypertension with recent cocaine use and CT contrast. Baseline creatinine ~1.4. Improved with IV fluids. His creatinine levels have continued to trend down consistently, 2.04 this admission.  -IV fluids for concurrent pancreatitis -daily BMP -avoid nephrotoxins  Dental pain and swelling Complains of an area of pain and swelling above his R lip for the past 2 days. No fevers recorded, though he complained of subjective fever/chills at home. Area of swelling noted with moderate tenderness, but difficult to visualize intra-oral aspect. Poor dentition. -consider dental consult -received contrast today for CT abd. Given recent AKI, ordered BMP and considering CT maxillofacial with contrast pending renal function -pain control as above  Cocaine use disorder Tobacco use disorder Reports no cocaine use since admission 2 weeks ago with hypertensive emergency. States he is  smoking 6 cigarettes a day, while he previously used 2 packs per day. -commended his progress and counseled on cessation  Dispo: Admit patient to Inpatient with expected length of stay greater than 2 midnights.  Signed: Remo Lipps, MD 05/02/2020, 9:14 PM  Pager: [phone]  After 5pm on weekdays and 1pm on weekends: On Call pager: [phone]

## 2020-05-02 NOTE — ED Notes (Signed)
Pt returned from CT °

## 2020-05-02 NOTE — Patient Instructions (Addendum)
Ricky Boyle,   Your blood sugar today is significantly elevated and your continued abdominal pain, weakness, thirst and increased urination are likely due to hyperglycemic emergency, possibly with continuation of your pancreatitis you were found to have on your previous admission.   You will be admitted to the hospital today after being seen in the emergency department.   You will have your labs drawn in the hospital.   Thank you and I hope you continue to feel better.

## 2020-05-02 NOTE — ED Triage Notes (Signed)
Pt reports seen here recently, told he had issues with his kidneys, returns today for lower abd pain and fatigue. Denies urinary symptoms.

## 2020-05-02 NOTE — Assessment & Plan Note (Signed)
Patient has generalized, severe abdominal pain, likely due to continued pancreatitis since recent admission vs. In setting of hyperglycemia vs. 2/2 constipation which patient states resolves with miralax at home. BM do improve his pain. Denies boring pain to the back.  - Will need repeat lipase - May need repeat CT scan if elevated - Continue bowel regimen

## 2020-05-02 NOTE — Progress Notes (Addendum)
   CC: Generalized Weakness  HPI:  [name] is a 59 y.o. M w/ PMHx uncontrolled HTN and polysubstance abuse (cocaine, benzos), presenting for severe generalized weakness associated with constant diffuse abdominal pain (x 2 weeks), decreased appetite, fevers/chills x 1 day, and intermittent diaphoresis. He also notes some pain in his lower left back and constipation that has improved with Miralax at home. He was recently admitted 9/15-9/24/21 for abdominal pain, headache, SOB, N/V, found to have acute pancreatitis. Despite normal Hb A1c 9/16, he developed severe hyperglycemia. He was discharged due to improvement in labs and symptoms, although returns today feeling worse. He says he continues to take amlodipine and prefers this over lisinopril he had been on in the past for HTN which caused leg swelling. He notes that he has a history of frequent skin abscesses and has a swollen tender area on his right cheek which he believes is causing his fevers and chills. He denies CP, SOB, light-headedness, dizziness, headaches, or palpitations. Endorses polyuria and polydipsia and states he has drank a lot of sugary drinks at home.   SHx: No abdominal surgeries Social Hx: patient states he has not used cocaine since previous admission. Denies hx alcohol use. Has decreased cigarette use from 2 PPD to 1 PPD.  Past Medical History:  Diagnosis Date  . AKI (acute kidney injury) (HCC)   . Cocaine abuse (HCC)   . Hypertension   . Opioid abuse (HCC)    Review of Systems:  All others negative except as noted above.   Physical Exam:  Vitals:   05/02/20 0909  BP: 137/86  Pulse: (!) 104  Temp: 98.4 F (36.9 C)  TempSrc: Oral  SpO2: 100%  Weight: 252 lb 9.6 oz (114.6 kg)  Height: 5\' 11"  (1.803 m)   General: Patient appears uncomfortable although in no acute distress. Eyes: Sclera non-icteric. No conjunctival injection.  HEENT: No nasal discharge, there is TTP over the right maxillary sinus region.  Edentulous. Respiratory: Lungs are CTA, bilaterally. No wheezes, rales, or rhonchi.  Cardiovascular: Regular rate and rhythm. No murmurs, rubs, or gallops. Trace bilateral lower extremity pitting edema.  Abdominal: Distended with moderate epigastric TTP and lower abdominal TTP in LLQ and RLQ with voluntary guarding but no rebound, with intact bowel sounds.  Neurological: Alert and oriented x 3  Skin: No rashes. There is a 1.5x1.5" area of tenderness and edema with some fluctuance over the right cheek. Psych: Normal affect. Normal tone of voice.   Assessment & Plan:   See Encounters Tab for problem based charting.  Patient seen with Dr. .  Heide Spark, MD 05/02/2020, 11:00pm Pager: [phone]

## 2020-05-02 NOTE — ED Notes (Signed)
Pt transported to CT ?

## 2020-05-02 NOTE — Assessment & Plan Note (Signed)
Patient had HTN emergency on recent admission this month in setting of recent cocaine use. Blood pressure have remained well controlled on amlodipine 10mg  daily. He notes LE edema with lisinopril and has not taken his other home medications. Was found to have elevated urinary metanephrines so pheochromocytoma is on the differential, although likely elevated in setting of cocaine use. States he has not used cocaine since previous admission. - Continue amlodipine 10mg  daily with close monitoring

## 2020-05-02 NOTE — ED Provider Notes (Signed)
[facility] EMERGENCY DEPARTMENT Provider Note   CSN: [id number] Arrival date & time: 05/02/20  1025     History Chief Complaint  Patient presents with  . Abdominal Pain    Ricky Boyle is a 59 y.o. male with a past medical history of hypertension, pancreatitis, substance abuse presenting to the ED with a chief complaint of abdominal pain and hyperglycemia.  Patient was admitted to the hospital and discharged almost 1 week ago for abdominal pain in the setting of acute pancreatitis.  He was found to have hyperglycemia with normal hemoglobin A1c.  The hyperglycemia thought to be reactive to his pancreatitis.  He was not placed on any medications for hyperglycemia and was told to follow-up closely.  He continues to have abdominal pain which he thought may have been due to constipation as well.  He has had to take the laxative regularly to have regular bowel movements with last dose and bowel movement being yesterday.  He did have one episode of dysuria yesterday.  Denies any fevers, chest pain, shortness of breath, vomiting, leg swelling.  He has not taken any medications for pain. He was evaluated in the internal medicine clinic today and sent to the ED for further work-up, treatment and admission.  HPI     Past Medical History:  Diagnosis Date  . AKI (acute kidney injury) (HCC)   . Cocaine abuse (HCC)   . Hypertension   . Opioid abuse [facility])     Patient Active Problem List   Diagnosis Date Noted  . Hyperglycemia 04/25/2020  . Acute renal failure (ARF) (HCC) 04/20/2020  . ARF (acute renal failure) (HCC) 04/20/2020  . Essential hypertension 04/20/2020  . Acute kidney injury (HCC) 04/18/2020  . Acute pancreatitis 04/17/2020  . Asymptomatic hypertensive urgency 04/17/2020  . Cocaine use 04/17/2020  . Abdominal pain 04/16/2020    History reviewed. No pertinent surgical history.     No family history on file.  Social History   Tobacco Use  . Smoking  status: Current Every Day Smoker    Packs/day: 0.50    Years: 25.00    Pack years: 12.50    Types: Cigarettes  . Smokeless tobacco: Never Used  Substance Use Topics  . Alcohol use: No  . Drug use: No    Home Medications Prior to Admission medications   Medication Sig Start Date End Date Taking? Authorizing Provider  amLODipine (NORVASC) 10 MG tablet Take 1 tablet (10 mg total) by mouth daily. 04/25/20 05/25/20 Yes Glenford Bayley, MD  Multiple Vitamins-Minerals (MULTIVITAMIN WITH MINERALS) tablet Take 1 tablet by mouth daily.   Yes [provider]  nicotine (NICODERM CQ - DOSED IN MG/24 HOURS) 21 mg/24hr patch Place 21 mg onto the skin daily.   Yes [provider]    Allergies    Bee venom and Shrimp [shellfish allergy]  Review of Systems   Review of Systems  Constitutional: Negative for appetite change, chills and fever.  HENT: Negative for ear pain, rhinorrhea, sneezing and sore throat.   Eyes: Negative for photophobia and visual disturbance.  Respiratory: Negative for cough, chest tightness, shortness of breath and wheezing.   Cardiovascular: Negative for chest pain and palpitations.  Gastrointestinal: Positive for abdominal pain. Negative for blood in stool, constipation, diarrhea, nausea and vomiting.  Genitourinary: Negative for dysuria, hematuria and urgency.  Musculoskeletal: Negative for myalgias.  Skin: Negative for rash.  Neurological: Negative for dizziness, weakness and light-headedness.    Physical Exam Updated Vital Signs BP Marland Kitchen)  152/120   Pulse 95   Temp 98.3 F (36.8 C) (Oral)   Resp (!) 29   SpO2 96%   Physical Exam Vitals and nursing note reviewed.  Constitutional:      General: He is not in acute distress.    Appearance: He is well-developed. He is obese.  HENT:     Head: Normocephalic and atraumatic.     Nose: Nose normal.  Eyes:     General: No scleral icterus.       Left eye: No discharge.     Conjunctiva/sclera:  Conjunctivae normal.  Cardiovascular:     Rate and Rhythm: Normal rate and regular rhythm.     Heart sounds: Normal heart sounds. No murmur heard.  No friction rub. No gallop.   Pulmonary:     Effort: Pulmonary effort is normal. No respiratory distress.     Breath sounds: Normal breath sounds.  Abdominal:     General: Bowel sounds are normal. There is no distension.     Palpations: Abdomen is soft.     Tenderness: There is abdominal tenderness in the left upper quadrant and left lower quadrant. There is no guarding.  Musculoskeletal:        General: Normal range of motion.     Cervical back: Normal range of motion and neck supple.  Skin:    General: Skin is warm and dry.     Findings: No rash.  Neurological:     Mental Status: He is alert.     Motor: No abnormal muscle tone.     Coordination: Coordination normal.     ED Results / Procedures / Treatments   Labs (all labs ordered are listed, but only abnormal results are displayed) Labs Reviewed  COMPREHENSIVE METABOLIC PANEL - Abnormal; Notable for the following components:      Result Value   Sodium 127 (*)    Potassium 5.3 (*)    Chloride 93 (*)    CO2 20 (*)    Glucose, Bld 538 (*)    BUN 51 (*)    Creatinine, Ser 2.04 (*)    Albumin 2.8 (*)    GFR calc non Af Amer 35 (*)    GFR calc Af Amer 40 (*)    All other components within normal limits  CBC - Abnormal; Notable for the following components:   WBC 14.6 (*)    All other components within normal limits  URINALYSIS, ROUTINE W REFLEX MICROSCOPIC - Abnormal; Notable for the following components:   Color, Urine STRAW (*)    Glucose, UA >=500 (*)    All other components within normal limits  URINE CULTURE  RESPIRATORY PANEL BY RT PCR (FLU A&B, COVID)  LIPASE, BLOOD    EKG None  Radiology No results found.  Procedures Procedures (including critical care time)  Medications Ordered in ED Medications  insulin aspart (novoLOG) injection 2 Units (has no  administration in time range)  sodium chloride 0.9 % bolus 1,000 mL (1,000 mLs Intravenous New Bag/Given 05/02/20 1324)  sodium zirconium cyclosilicate (LOKELMA) packet 10 g (10 g Oral Given 05/02/20 1325)    ED Course  I have reviewed the triage vital signs and the nursing notes.  Pertinent labs & imaging results that were available during my care of the patient were reviewed by me and considered in my medical decision making (see chart for details).    MDM Rules/Calculators/A&P  59 year old male with recent admission for pancreatitis, hyperglycemia, AKI, uncontrolled hypertension presenting to the ED with a chief complaint of abdominal pain.  Has been having persistent abdominal pain since his admission and discharge.  Has been taking laxatives on a regular basis with improvement in bowel movements.  He was evaluated in IM clinic today, sent to the ED for further evaluation for concern for hypoglycemia.  Hyperglycemia noted on admission but improved with IV fluids.  Patient with some tenderness of the abdomen today.  Work-up significant for leukocytosis of 14.6, creatinine of 2 which is improved from his discharge.  Lipase normal today, although similar findings on admission with CT evidence of pancreatitis. Potassium elevated at 5.3, hyponatremia of 127 and blood glucose of 538 with normal anion gap.  Will give IV fluids, Lokelma, 2 units of subcutaneous insulin and reassess.  UA without evidence of infection. Will need CT of abdomen pelvis to rule out other infectious process or recurrent pancreatitis Patient will need admission for ongoing management of his recurrent hyperglycemia, as recommended by IM team. Care handed off to oncoming provider pending remainder of workup.   Portions of this note were generated with Scientist, clinical (histocompatibility and immunogenetics). Dictation errors may occur despite best attempts at proofreading.  Final Clinical Impression(s) / ED Diagnoses Final diagnoses:   Hyperglycemia  Hyperkalemia    Rx / DC Orders ED Discharge Orders    None       Dietrich Pates, PA-C 05/02/20 1506    Sabas Sous, MD 05/02/20 1620

## 2020-05-02 NOTE — ED Provider Notes (Signed)
  Physical Exam  BP (!) 152/120   Pulse 95   Temp 98.3 F (36.8 C) (Oral)   Resp (!) 29   SpO2 96%   Physical Exam  ED Course/Procedures     Procedures  MDM   Pt signed out to me by Malachi Carl, PA-C. Please see previous notes for further history.   In brief, pt presenting for evaluation of elevated BGLs, abd pain, and constipation. Pt was recently admitted for the same. He had pancreatitis on CT, but normal lipase. His BGLs were elevated at the time, but A1c was normal. Today he was at the clinic for f/u, had recurrent sxs and elevated BGLs and sent to the ED for further eval and likely admission. Workup in the ED shows BGL of 538, no DKA> elevated potassium of 5.3. mildly elevated SCr at 2, improved from when he was discharged. He has received fluids, lokelma, and insulin, waiting on CT before admission.   Repeat cbg 1.5 hrs   CT shows shows complicated pancreatitis with necrosis and 2 large pseudocysts. Will call for admission.   Discussed with IMTS, pt to be admitted.           Alveria Apley, PA-C 05/02/20 2102    Eber Hong, MD 05/04/20 [phone]

## 2020-05-02 NOTE — Assessment & Plan Note (Signed)
Last Hb A1c 04/17/20 was 5.2. Patient denies history of diabetes although notes polyuria, polydipsia, diffuse abdominal pain, decreased PO intake with consumption of "only sugar" recently with fluids. Sugars became severely elevated on recent admission, thought to be reactive to acute pancreatitis. Blood glucose today 553. Patient may be in DKA vs. HSS currently. - Patient was sent directly to ED triage  - ED notified - Will require fluid resuscitation and CMP/CMP

## 2020-05-03 ENCOUNTER — Other Ambulatory Visit: Payer: Self-pay

## 2020-05-03 DIAGNOSIS — I129 Hypertensive chronic kidney disease with stage 1 through stage 4 chronic kidney disease, or unspecified chronic kidney disease: Secondary | ICD-10-CM

## 2020-05-03 DIAGNOSIS — F17210 Nicotine dependence, cigarettes, uncomplicated: Secondary | ICD-10-CM

## 2020-05-03 DIAGNOSIS — N189 Chronic kidney disease, unspecified: Secondary | ICD-10-CM

## 2020-05-03 DIAGNOSIS — K8689 Other specified diseases of pancreas: Secondary | ICD-10-CM

## 2020-05-03 DIAGNOSIS — K863 Pseudocyst of pancreas: Principal | ICD-10-CM

## 2020-05-03 DIAGNOSIS — N179 Acute kidney failure, unspecified: Secondary | ICD-10-CM

## 2020-05-03 DIAGNOSIS — K0889 Other specified disorders of teeth and supporting structures: Secondary | ICD-10-CM

## 2020-05-03 DIAGNOSIS — E11649 Type 2 diabetes mellitus with hypoglycemia without coma: Secondary | ICD-10-CM

## 2020-05-03 DIAGNOSIS — K859 Acute pancreatitis without necrosis or infection, unspecified: Secondary | ICD-10-CM

## 2020-05-03 DIAGNOSIS — R947 Abnormal results of other endocrine function studies: Secondary | ICD-10-CM

## 2020-05-03 LAB — BASIC METABOLIC PANEL
Anion gap: 13 (ref 5–15)
BUN: 39 mg/dL — ABNORMAL HIGH (ref 6–20)
CO2: 20 mmol/L — ABNORMAL LOW (ref 22–32)
Calcium: 9.2 mg/dL (ref 8.9–10.3)
Chloride: 102 mmol/L (ref 98–111)
Creatinine, Ser: 1.66 mg/dL — ABNORMAL HIGH (ref 0.61–1.24)
GFR calc Af Amer: 52 mL/min — ABNORMAL LOW (ref >60)
GFR calc non Af Amer: 45 mL/min — ABNORMAL LOW (ref >60)
Glucose, Bld: 211 mg/dL — ABNORMAL HIGH (ref 70–99)
Potassium: 4.9 mmol/L (ref 3.5–5.1)
Sodium: 135 mmol/L (ref 135–145)

## 2020-05-03 LAB — CBC
HCT: 41.7 % (ref 39.0–52.0)
Hemoglobin: 13.7 g/dL (ref 13.0–17.0)
MCH: 30.9 pg (ref 26.0–34.0)
MCHC: 32.9 g/dL (ref 30.0–36.0)
MCV: 94.1 fL (ref 80.0–100.0)
Platelets: 300 10*3/uL (ref 150–400)
RBC: 4.43 MIL/uL (ref 4.22–5.81)
RDW: 12.9 % (ref 11.5–15.5)
WBC: 17.1 10*3/uL — ABNORMAL HIGH (ref 4.0–10.5)
nRBC: 0 % (ref 0.0–0.2)

## 2020-05-03 LAB — URINE CULTURE: Culture: NO GROWTH

## 2020-05-03 LAB — GLUCOSE, CAPILLARY
Glucose-Capillary: 295 mg/dL — ABNORMAL HIGH (ref 70–99)
Glucose-Capillary: 255 mg/dL — ABNORMAL HIGH (ref 70–99)

## 2020-05-03 LAB — CBG MONITORING, ED
Glucose-Capillary: 176 mg/dL — ABNORMAL HIGH (ref 70–99)
Glucose-Capillary: 127 mg/dL — ABNORMAL HIGH (ref 70–99)
Glucose-Capillary: 304 mg/dL — ABNORMAL HIGH (ref 70–99)

## 2020-05-03 MED ORDER — INSULIN ASPART 100 UNIT/ML ~~LOC~~ SOLN
5.0000 [IU] | Freq: Three times a day (TID) | SUBCUTANEOUS | Status: DC
  Administered 2020-05-03 – 2020-05-04 (×3): 5 [IU] via SUBCUTANEOUS

## 2020-05-03 MED ORDER — AMLODIPINE BESYLATE 10 MG PO TABS
10.0000 mg | ORAL_TABLET | Freq: Every day | ORAL | Status: DC
  Administered 2020-05-03 – 2020-05-04 (×2): 10 mg via ORAL
  Filled 2020-05-03: qty 1
  Filled 2020-05-03: qty 2

## 2020-05-03 MED ORDER — LACTATED RINGERS IV SOLN: INTRAVENOUS | Status: DC

## 2020-05-03 MED ORDER — LACTATED RINGERS IV SOLN: INTRAVENOUS | Status: AC

## 2020-05-03 MED ORDER — INSULIN ASPART 100 UNIT/ML ~~LOC~~ SOLN
0.0000 [IU] | Freq: Every day | SUBCUTANEOUS | Status: DC
  Administered 2020-05-03: 3 [IU] via SUBCUTANEOUS

## 2020-05-03 MED ORDER — INSULIN ASPART 100 UNIT/ML ~~LOC~~ SOLN
0.0000 [IU] | Freq: Three times a day (TID) | SUBCUTANEOUS | Status: DC
  Administered 2020-05-03 – 2020-05-04 (×2): 8 [IU] via SUBCUTANEOUS
  Administered 2020-05-04: 11 [IU] via SUBCUTANEOUS

## 2020-05-03 MED ORDER — INSULIN GLARGINE 100 UNIT/ML ~~LOC~~ SOLN
10.0000 [IU] | Freq: Every day | SUBCUTANEOUS | Status: DC
  Administered 2020-05-03: 10 [IU] via SUBCUTANEOUS
  Filled 2020-05-03 (×2): qty 0.1

## 2020-05-03 MED ORDER — INSULIN GLARGINE 100 UNIT/ML ~~LOC~~ SOLN
10.0000 [IU] | Freq: Every day | SUBCUTANEOUS | Status: DC
  Filled 2020-05-03: qty 0.1

## 2020-05-03 NOTE — ED Notes (Signed)
BMP sample hemolyzed. Tried to attempt to draw blood via venipuncture. Unable to obtain. Will request second phlebotomy to obtain.

## 2020-05-03 NOTE — Consult Note (Addendum)
Referring Provider: ? Primary Care Physician:  Patient, No Pcp Per Primary Gastroenterologist:  Unassigned  Reason for Consultation:  Pancreatitis  HPI: Ricky Boyle is a 59 y.o. male hx of HTN, cocaine use, recurrent pancreatitis with recent admission for this on 9/15.  He was treated conservatively and was discharged on 9/18, but tells me that he was not really feeling much better at that point.Marland Kitchen  He presents back now with generalized weakness, fatigue, and abdominal pain.  CT scan of the abdomen and pelvis shows the following:  IMPRESSION: 1. Complicated Pancreatitis. 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 - second developing pseudocyst along the undersurface of the greater curve of the stomach, roughly 1/2 the size of the first. - associated phlegmon in the mesentery of the proximal small bowel.  2. Splenic vein and portal vein remain patent, but there are new perigastric and mesenteric collaterals. No bowel obstruction. No biliary ductal dilatation.  3. Aortic Atherosclerosis (ICD10-I70.0).  His lipase is normal.  He does not have any IVFs running currently and his Hgb is 13.7 grams, unchanged from previous.  He actually tells me that he is feeling better now though and is asking to eat.  He says that he has not used cocaine in a couple of weeks and has "sworn it off" now, but his UDS is positive here again today.   Past Medical History:  Diagnosis Date  . AKI (acute kidney injury) (HCC)   . Cocaine abuse (HCC)   . Hypertension   . Opioid abuse (HCC)     History reviewed. No pertinent surgical history.  Prior to Admission medications   Medication Sig Start Date End Date Taking? Authorizing Provider  amLODipine (NORVASC) 10 MG tablet Take 1 tablet (10 mg total) by mouth daily. 04/25/20 05/25/20 Yes Glenford Bayley, MD  Multiple Vitamins-Minerals (MULTIVITAMIN WITH MINERALS)  tablet Take 1 tablet by mouth daily.   Yes [provider]  nicotine (NICODERM CQ - DOSED IN MG/24 HOURS) 21 mg/24hr patch Place 21 mg onto the skin daily.   Yes [provider]    Current Facility-Administered Medications  Medication Dose Route Frequency Provider Last Rate Last Admin  . 0.9 %  sodium chloride infusion   Intravenous Continuous Claudean Severance, MD   Stopped at 05/03/20 0145  . acetaminophen (TYLENOL) tablet 650 mg  650 mg Oral Q6H PRN Claudean Severance, MD       Or  . acetaminophen (TYLENOL) suppository 650 mg  650 mg Rectal Q6H PRN Claudean Severance, MD      . HYDROmorphone (DILAUDID) injection 0.5 mg  0.5 mg Intravenous Q4H PRN Claudean Severance, MD   0.5 mg at 05/03/20 0750  . insulin aspart (novoLOG) injection 0-15 Units  0-15 Units Subcutaneous Q4H Claudean Severance, MD   2 Units at 05/03/20 0734  . ondansetron (ZOFRAN) tablet 4 mg  4 mg Oral Q6H PRN Claudean Severance, MD       Or  . ondansetron [facility]) injection 4 mg  4 mg Intravenous Q6H PRN Claudean Severance, MD       Current Outpatient Medications  Medication Sig Dispense Refill  . amLODipine (NORVASC) 10 MG tablet Take 1 tablet (10 mg total) by mouth daily. 30 tablet 0  . Multiple Vitamins-Minerals (MULTIVITAMIN WITH MINERALS) tablet Take 1 tablet by mouth daily.    . nicotine (NICODERM CQ - DOSED IN MG/24 HOURS)  21 mg/24hr patch Place 21 mg onto the skin daily.      Allergies as of 05/02/2020 - Review Complete 05/02/2020  Allergen Reaction Noted  . Bee venom Anaphylaxis 09/22/2013  . Shrimp [shellfish allergy] Anaphylaxis 09/22/2013    Family History  Problem Relation Age of Onset  . Hypertension Mother   . Hypertension Sister   . Cancer Neg Hx   . Heart attack Neg Hx     Social History   Socioeconomic History  . Marital status: Widowed    Spouse name: Not on file  . Number of children: Not on file  . Years of education: Not on file  . Highest education level: Not on  file  Occupational History  . Not on file  Tobacco Use  . Smoking status: Current Every Day Smoker    Packs/day: 0.50    Years: 25.00    Pack years: 12.50    Types: Cigarettes  . Smokeless tobacco: Never Used  Substance and Sexual Activity  . Alcohol use: No  . Drug use: No  . Sexual activity: Yes  Other Topics Concern  . Not on file  Social History Narrative  . Not on file   Social Determinants of Health   Financial Resource Strain:   . Difficulty of Paying Living Expenses: Not on file  Food Insecurity:   . Worried About Programme researcher, broadcasting/film/video in the Last Year: Not on file  . Ran Out of Food in the Last Year: Not on file  Transportation Needs:   . Lack of Transportation (Medical): Not on file  . Lack of Transportation (Non-Medical): Not on file  Physical Activity:   . Days of Exercise per Week: Not on file  . Minutes of Exercise per Session: Not on file  Stress:   . Feeling of Stress : Not on file  Social Connections:   . Frequency of Communication with Friends and Family: Not on file  . Frequency of Social Gatherings with Friends and Family: Not on file  . Attends Religious Services: Not on file  . Active Member of Clubs or Organizations: Not on file  . Attends Banker Meetings: Not on file  . Marital Status: Not on file  Intimate Partner Violence:   . Fear of Current or Ex-Partner: Not on file  . Emotionally Abused: Not on file  . Physically Abused: Not on file  . Sexually Abused: Not on file    Review of Systems: ROS is O/W negative except as mentioned in the HPI.  Physical Exam: Vital signs in last 24 hours: Temp:  [97.9 F (36.6 C)-98.4 F (36.9 C)] 97.9 F (36.6 C) (10/02 0753) Pulse Rate:  [95-111] 102 (10/02 0753) Resp:  [16-29] 16 (10/02 0753) BP: (111-169)/(79-120) 139/88 (10/02 0753) SpO2:  [93 %-100 %] 95 % (10/02 0753) Weight:  [114.6 kg] 114.6 kg (10/01 0909)   General:   Alert,  Well-developed, well-nourished, pleasant and  cooperative in NAD Head:  Normocephalic and atraumatic. Eyes:  Sclera clear, no icterus.   Conjunctiva pink. Ears:  Normal auditory acuity. Mouth:  No deformity or lesions.   Lungs:  Clear throughout to auscultation.   No wheezes, crackles, or rhonchi.  Heart:  Regular rate and rhythm; no murmurs, clicks, rubs,  or gallops. Abdomen:  Soft,nontender, BS active,nonpalp mass or hsm.   Rectal:  Deferred  Msk:  Symmetrical without gross deformities. Pulses:  Normal pulses noted. Extremities:  Without clubbing or edema. Neurologic:  Alert and  oriented  x4;  grossly normal neurologically. Skin:  Intact without significant lesions or rashes. Psych:  Alert and cooperative. Normal mood and affect.  Lab Results: Recent Labs    05/02/20 1034 05/03/20 0334  WBC 14.6* 17.1*  HGB 13.7 13.7  HCT 41.5 41.7  PLT 317 300   BMET Recent Labs    05/02/20 1034 05/03/20 0334  NA 127* 135  K 5.3* 4.9  CL 93* 102  CO2 20* 20*  GLUCOSE 538* 211*  BUN 51* 39*  CREATININE 2.04* 1.66*  CALCIUM 9.1 9.2   LFT Recent Labs    05/02/20 1034  PROT 7.8  ALBUMIN 2.8*  AST 15  ALT 25  ALKPHOS 94  BILITOT 0.7   Studies/Results: CT ABDOMEN PELVIS W CONTRAST  Result Date: 05/02/2020 CLINICAL DATA:  [age] year old male with lower abdominal pain, fatigue. EXAM: CT ABDOMEN AND PELVIS WITH CONTRAST TECHNIQUE: Multidetector CT imaging of the abdomen and pelvis was performed using the standard protocol following bolus administration of intravenous contrast. CONTRAST:  80mL OMNIPAQUE IOHEXOL 300 MG/ML  SOLN COMPARISON:  CTA chest abdomen and pelvis 04/16/2020. CT Abdomen and Pelvis 07/07/2009. FINDINGS: Lower chest: Lung base atelectasis greater on the left. No pericardial or pleural effusion. Hepatobiliary: Vicarious contrast excretion suspected in the gallbladder. Liver enhancement remains within normal limits. No bile duct enlargement. Pancreas: Subtotal pancreatic necrosis with oval pseudo cyst occupying the  pancreatic bed in the lesser sac encompassing 55 x 153 by 60 mm (AP by transverse by CC), estimated volume 200 mL. There is a 2nd developing pseudocyst along the undersurface of the gastric greater curve (series 3, image 29 and sagittal image 79) which appears slightly less organized, measuring 51 by about 72 by 45 mm (AP by transverse by CC). There is preserved enhancement of the pancreatic uncinate and a portion of the pancreatic head. No ductal dilatation identified. Spleen: Negative. Adrenals/Urinary Tract: Adrenal glands remain normal. Kidneys appears stable. Renal enhancement and contrast excretion is symmetric. Unremarkable urinary bladder. Stomach/Bowel: No dilated large or small bowel. Negative appendix. Small volume of oral contrast intermittently in the small intestine. Mild mass effect on the stomach related to pancreatic pseudocysts described above. Increased perigastric vascular collaterals and mild stranding. The stomach and duodenum are decompressed. Confluent inflammation and/or phlegmon in the small bowel mesentery just distal to the ligament of Treitz (series 3, image 39). Superimposed increased mesenteric vascular collaterals compared to last month. No pneumoperitoneum. No layering free fluid, although there is thickening and nodularity of the left lateral conal fascia continuing into the pelvis. Vascular/Lymphatic: Aortoiliac calcified atherosclerosis. Major arterial structures remain patent. The splenic vein remains patent on series 3, images 26 and 25 although is abutted by the pseudocyst. Elsewhere the portal venous system appears to remain patent. Reproductive: Negative. Other: No pelvic free fluid. Musculoskeletal: No acute osseous abnormality identified. IMPRESSION: 1. Complicated Pancreatitis. 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 - second developing pseudocyst along the undersurface of the  greater curve of the stomach, roughly 1/2 the size of the first. - associated phlegmon in the mesentery of the proximal small bowel. 2. Splenic vein and portal vein remain patent, but there are new perigastric and mesenteric collaterals. No bowel obstruction. No biliary ductal dilatation. 3. Aortic Atherosclerosis (ICD10-I70.0). Electronically Signed   By: Odessa FlemingH  Hall M.D.   On: 05/02/2020 19:27   IMPRESSION:  *Complicated pancreatitis with subtotal necrosis and developing pseudocysts.  ? Secondary to cocaine use.  Imaging negative for gallstones or biliary tree abnormalities.  Triglycerides are normal.  He denies any ETOH use.  PLAN: -Supportive care with IV hydration, pain control, antiemetics.  He does not have IVFs running currently so I have replaced an order to start NS at 175 cc/hour for now.  He is hungry which is a good sign but will allow clear liquids only for now. -Trend labs including CBC and BMP. -Hold on antibiotics for now.  He has an elevated white blood cell count, but is afebrile. -Will check IgG4 level to rule out autoimmune pancreatitis.  Princella Pellegrini. Zehr  05/03/2020, 8:47 AM   ________________________________________________________________________  [facility] MD note:  I personally examined the patient, reviewed the data and agree with the assessment and plan described above.  He does not drink alcohol but he does use cocaine and smokes 2 PPD. Cocaine is probably a pretty rare cause of acute pancreatitis, smoking cigarettes is quite a bit more common.  He does not have gallstones in his GB.  I agree with checking IgG4 level to rule out that entity.  I don't think this is a new episode of acute pancreatitis. Rather, I suspect that the large peripancreatic fluid collections are residual effect from the acute pancreatitis 2-3 weeks ago.  I can see very little remaining pancreatic parenchyma on his CT (limited to head, neck). The rest of his pancreas seems obliterated.  Impressively  he does not really have much abdominal pains and he has NO nausea or vomiting.  I think it is safe for him to continue eating and I ordered low fat, heart healthy diet.  No signs of SIRS.  IV fluids around 50 or so should suffice for now.  With near total obliteration of his pancreas he is going to have troubles with his blood sugars, probably indefinitely.  The evolving peripancreatic fluid collections may or may not resolve over time and will require periodic imaging. I do not think he needs antibiotics or any type of drainage procedures for this.  If the fluid collection start to cause significant pains or obstruct his GI tract then that will need to be reconsidered but that is certainly not the case now.  I can take weeks or longer for fluid collections such as these to resorb.  He knows smoking is a well described risk factor for futher damage to his pancreas whereas cocaine is a more rare cause.  Will follow along.   Rob Bunting, MD [facility] Pager [phone]

## 2020-05-03 NOTE — ED Notes (Signed)
Pt given black decaf coffee, cranberry juice and popsicle per pt request

## 2020-05-03 NOTE — Progress Notes (Addendum)
Subjective: Patient reports that he feels much improved this AM. He would like to be able to eat. He continues to have mild abdominal pain. Patient reports that he has not had emesis or diarrhea. He has been able to ambulate well overnight in the ED.  Objective:  Vital signs in last 24 hours: Vitals:   05/03/20 0041 05/03/20 0237 05/03/20 0448 05/03/20 0753  BP: (!) 152/101 (!) 154/97 111/79 139/88  Pulse: (!) 110 (!) 104 (!) 107 (!) 102  Resp: 18 16 16 16   Temp:    97.9 F (36.6 C)  TempSrc:    Axillary  SpO2: 97% 96%  95%   Physical Exam Constitutional:      Appearance: He is well-developed.  HENT:     Head: Normocephalic and atraumatic.     Comments: No selling noted in maxillofacial area. Per patient the area ( R upper lip) "popped" overnight that he was concerned about. Did not pop into his mouth. Cardiovascular:     Rate and Rhythm: Normal rate and regular rhythm.  Pulmonary:     Effort: Pulmonary effort is normal.     Breath sounds: Normal breath sounds.  Abdominal:     General: Bowel sounds are decreased. There is distension.     Tenderness: There is no abdominal tenderness.  Skin:    General: Skin is warm and dry.  Neurological:     Mental Status: He is alert and oriented to person, place, and time.     Assessment/Plan:  Principal Problem:   Pancreatitis, recurrent Active Problems:   Essential hypertension   Hyperglycemia  Kivon Aprea is a 59 y.o. year old male with hx of HTN, cocaine and benzo use, recurrent pancreatitis admitted for symptomatic hyperglycemia and pancreatic pseudocysts.   Pancreatic necrosis and pseudocysts: Clinically stable.  Does not seem to be in active pancreatitis.  Seems to be having only mild-moderate symptoms from the pseudocysts.  Greatly appreciate GI consultation.  Planning for supportive care, no indication for antibiotics or drainage right now.  Will need to advance his diet and see if he has early satiety.  Etiology of the  pseudycysts is likely from his recent hospitalizations for pancreatitis, etiology of that was likely related to tobacco use.   New diagnosis diabetes: Blood sugars the last 2 admissions have been steadily rising.  He is admitted with symptomatic hypoglycemia causing polyuria, polydipsia, and dehydration.  No signs of DKA, but certainly at risk for this in the future.  Etiology of this diabetes is almost certainly related to the extensive pancreatic necrosis and loss of endocrine function.  As such she will do best with a insulin based diabetes regimen.  Required 31 units of sliding scale insulin yesterday.  We will transition this to long-acting and mealtime. -10 U Lantus daily -5 units short acting insulin with meals -Sliding scale insulin and diabetes educator.   HTN  Hx Elevated plasma metanephrines Patient did not report a headache this AM> His BP this AM 139/88. -Restart home amlodopine -history not totally consistent with pheochromocytoma. Monitor additional symptoms and consider further imaging such as iobenguane I-123 or FDG-PET scan   Resolving AKI vs CKD Most recent Cr 1.66. Baseline ~ 1.4. Cr has been trending downward since admission with IV fluids. Patient was likely dehydrated as he presented with hyperglycemia.  -IV fluids for concurrent pancreatitis and clear liquid diet. -daily BMP -avoid nephrotoxins   Dental pain and swelling- resolved There was no growth, swelling, or pain at the R  upper lip this AM. Patient reported that "it popped" overnight. Area was likely a cyst. Patient reported that the contents did not pop into his mouth he only noticed this AM that his face was no longer swollen or painful.    Cocaine use disorder Tobacco use disorder Reports no cocaine use since admission 2 weeks ago with hypertensive emergency. States he is smoking 6 cigarettes a day, while he previously used 2 packs per day. -commended his progress and counseled on cessation   Prior to  Admission Living Arrangement: Home Anticipated Discharge Location: Home Barriers to Discharge: Treatment Dispo: Anticipated discharge in approximately 1-2 day(s).   Bobbye Morton, MD 05/03/2020, 11:32 AM Pager: [phone] After 5pm on weekdays and 1pm on weekends: On Call pager [phone]

## 2020-05-03 NOTE — ED Notes (Signed)
GI at bedside

## 2020-05-03 NOTE — ED Notes (Signed)
Pt transferred to hospital bed for comfort.

## 2020-05-04 LAB — BASIC METABOLIC PANEL
Anion gap: 9 (ref 5–15)
BUN: 30 mg/dL — ABNORMAL HIGH (ref 6–20)
CO2: 24 mmol/L (ref 22–32)
Calcium: 9 mg/dL (ref 8.9–10.3)
Chloride: 103 mmol/L (ref 98–111)
Creatinine, Ser: 1.59 mg/dL — ABNORMAL HIGH (ref 0.61–1.24)
GFR calc Af Amer: 55 mL/min — ABNORMAL LOW (ref >60)
GFR calc non Af Amer: 47 mL/min — ABNORMAL LOW (ref >60)
Glucose, Bld: 255 mg/dL — ABNORMAL HIGH (ref 70–99)
Potassium: 4.6 mmol/L (ref 3.5–5.1)
Sodium: 136 mmol/L (ref 135–145)

## 2020-05-04 LAB — CBC
HCT: 38.6 % — ABNORMAL LOW (ref 39.0–52.0)
Hemoglobin: 12.6 g/dL — ABNORMAL LOW (ref 13.0–17.0)
MCH: 31.6 pg (ref 26.0–34.0)
MCHC: 32.6 g/dL (ref 30.0–36.0)
MCV: 96.7 fL (ref 80.0–100.0)
Platelets: 286 10*3/uL (ref 150–400)
RBC: 3.99 MIL/uL — ABNORMAL LOW (ref 4.22–5.81)
RDW: 13 % (ref 11.5–15.5)
WBC: 13.1 10*3/uL — ABNORMAL HIGH (ref 4.0–10.5)
nRBC: 0 % (ref 0.0–0.2)

## 2020-05-04 LAB — GLUCOSE, CAPILLARY
Glucose-Capillary: 328 mg/dL — ABNORMAL HIGH (ref 70–99)
Glucose-Capillary: 276 mg/dL — ABNORMAL HIGH (ref 70–99)

## 2020-05-04 MED ORDER — LANTUS SOLOSTAR 100 UNIT/ML ~~LOC~~ SOPN: 20.0000 [IU] | PEN_INJECTOR | Freq: Every day | SUBCUTANEOUS | 0 refills | Status: DC

## 2020-05-04 MED ORDER — BLOOD GLUCOSE MONITOR KIT: PACK | 0 refills | Status: DC

## 2020-05-04 MED ORDER — SENNOSIDES-DOCUSATE SODIUM 8.6-50 MG PO TABS
1.0000 | ORAL_TABLET | Freq: Every day | ORAL | Status: DC
  Administered 2020-05-04: 1 via ORAL
  Filled 2020-05-04: qty 1

## 2020-05-04 MED ORDER — NOVOLOG FLEXPEN 100 UNIT/ML ~~LOC~~ SOPN: 10.0000 [IU] | PEN_INJECTOR | Freq: Three times a day (TID) | SUBCUTANEOUS | 0 refills | Status: DC

## 2020-05-04 MED ORDER — SENNOSIDES-DOCUSATE SODIUM 8.6-50 MG PO TABS: 1.0000 | ORAL_TABLET | Freq: Every evening | ORAL | 0 refills | Status: DC | PRN

## 2020-05-04 NOTE — Progress Notes (Signed)
[facility] Progress Note    Since last GI note: Eating solid food without abd pain, nausea or vomiting. Biggest complaint is constipation, no BM in 2 days or so.    Objective: Vital signs in last 24 hours: Temp:  [97.9 F (36.6 C)-99 F (37.2 C)] 99 F (37.2 C) (10/03 0425) Pulse Rate:  [89-102] 89 (10/03 0425) Resp:  [16-20] 20 (10/03 0425) BP: (133-143)/(81-96) 133/81 (10/03 0425) SpO2:  [93 %-100 %] 93 % (10/03 0425) Weight:  [113.2 kg-113.4 kg] 113.2 kg (10/03 0425) Last BM Date: 05/02/20 General: alert and oriented times 3 Heart: regular rate and rythm Abdomen: soft, mildly, non-distended, normal bowel sounds  Lab Results: Recent Labs    05/02/20 1034 05/03/20 0334 05/04/20 0318  WBC 14.6* 17.1* 13.1*  HGB 13.7 13.7 12.6*  PLT 317 300 286  MCV 93.5 94.1 96.7   Recent Labs    05/02/20 1034 05/03/20 0334 05/04/20 0318  NA 127* 135 136  K 5.3* 4.9 4.6  CL 93* 102 103  CO2 20* 20* 24  GLUCOSE 538* 211* 255*  BUN 51* 39* 30*  CREATININE 2.04* 1.66* 1.59*  CALCIUM 9.1 9.2 9.0   Recent Labs    05/02/20 1034  PROT 7.8  ALBUMIN 2.8*  AST 15  ALT 25  ALKPHOS 94  BILITOT 0.7    Medications: Scheduled Meds: . amLODipine  10 mg Oral Daily  . insulin aspart  0-15 Units Subcutaneous TID WC  . insulin aspart  0-5 Units Subcutaneous QHS  . insulin aspart  5 Units Subcutaneous TID WC  . insulin glargine  10 Units Subcutaneous QHS   Continuous Infusions: . lactated ringers 50 mL/hr at 05/03/20 1304   PRN Meds:.acetaminophen **OR** acetaminophen, HYDROmorphone (DILAUDID) injection, ondansetron **OR** ondansetron (ZOFRAN) IV   Assessment/Plan: 59 y.o. male with evolving pancreatic fluid collections, psuedocystis since acute pancreatitis about 2 weeks ago  Etiology of his pancreatitis 2 weeks ago not completely clear (he denies etoh, has no gallstones).  Cigarette smoking may have contributed, cocaine use likewise but is probably less  likely.  The evolving pancreatic fluid collections are impressive on imaging and it appears that much of his pancreatic parenchyma is obliterated. Normally I would expect significant abd discomfort and post prandial vomiting or at least nausea however he fortunately does not have those issues. Given the obliteration of much of his pancreas I suspect he will be insulin requiring diabetic indefinitely.  He understands that the evolving fluid collections may get worse with time or may resolve with time.  Stopping smoking, stopping cocaine may increase his chances of better recover.   He will need surveillance imaging with CT scan in 4-5 weeks, or sooner if he starts to develop post prandial issues or significant abd pains.    I recommended he increase his daily miralax to two doses once daily for his constipation.  He is safe for d/c from my perspective.    Rachael Fee, MD  05/04/2020, 7:52 AM [facility] Pager [phone]

## 2020-05-04 NOTE — Discharge Instructions (Signed)
Hyperglycemia Hyperglycemia is when the sugar (glucose) level in your blood is too high. It may not cause symptoms. If you do have symptoms, they may include warning signs, such as:  Feeling more thirsty than normal.  Hunger.  Feeling tired.  Needing to pee (urinate) more than normal.  Blurry eyesight (vision). You may get other symptoms as it gets worse, such as:  Dry mouth.  Not being hungry (loss of appetite).  Fruity-smelling breath.  Weakness.  Weight gain or loss that is not planned. Weight loss may be fast.  A tingling or numb feeling in your hands or feet.  Headache.  Skin that does not bounce back quickly when it is lightly pinched and released (poor skin turgor).  Pain in your belly (abdomen).  Cuts or bruises that heal slowly. High blood sugar can happen to people who do or do not have diabetes. High blood sugar can happen slowly or quickly, and it can be an emergency. Follow these instructions at home: General instructions  Take over-the-counter and prescription medicines only as told by your doctor.  Do not use products that contain nicotine or tobacco, such as cigarettes and e-cigarettes. If you need help quitting, ask your doctor.  Limit alcohol intake to no more than 1 drink per day for nonpregnant women and 2 drinks per day for men. One drink equals 12 oz of beer, 5 oz of wine, or 1 oz of hard liquor.  Manage stress. If you need help with this, ask your doctor.  Keep all follow-up visits as told by your doctor. This is important. Eating and drinking   Stay at a healthy weight.  Exercise regularly, as told by your doctor.  Drink enough fluid, especially when you: ? Exercise. ? Get sick. ? Are in hot temperatures.  Eat healthy foods, such as: ? Low-fat (lean) proteins. ? Complex carbs (complex carbohydrates), such as whole wheat bread or brown rice. ? Fresh fruits and vegetables. ? Low-fat dairy products. ? Healthy fats.  Drink enough  fluid to keep your pee (urine) clear or pale yellow. If you have diabetes:   Make sure you know the symptoms of hyperglycemia.  Follow your diabetes management plan, as told by your doctor. Make sure you: ? Take insulin and medicines as told. ? Follow your exercise plan. ? Follow your meal plan. Eat on time. Do not skip meals. ? Check your blood sugar as often as told. Make sure to check before and after exercise. If you exercise longer or in a different way than you normally do, check your blood sugar more often. ? Follow your sick day plan whenever you cannot eat or drink normally. Make this plan ahead of time with your doctor.  Share your diabetes management plan with people in your workplace, school, and household.  Check your urine for ketones when you are ill and as told by your doctor.  Carry a card or wear jewelry that says that you have diabetes. Contact a doctor if:  Your blood sugar level is higher than 240 mg/dL (13.3 mmol/L) for 2 days in a row.  You have problems keeping your blood sugar in your target range.  High blood sugar happens often for you. Get help right away if:  You have trouble breathing.  You have a change in how you think, feel, or act (mental status).  You feel sick to your stomach (nauseous), and that feeling does not go away.  You cannot stop throwing up (vomiting). These symptoms may   be an emergency. Do not wait to see if the symptoms will go away. Get medical help right away. Call your local emergency services (911 in the [place] Do not drive yourself to the hospital. Summary  Hyperglycemia is when the sugar (glucose) level in your blood is too high.  High blood sugar can happen to people who do or do not have diabetes.  Make sure you drink enough fluids, eat healthy foods, and exercise regularly.  Contact your doctor if you have problems keeping your blood sugar in your target range. This information is not intended to replace advice given  to you by your health care provider. Make sure you discuss any questions you have with your health care provider. Document Revised: 04/05/2016 Document Reviewed: 04/05/2016 Elsevier Patient Education  2020 Elsevier Inc.   Ricky Boyle,  The clinic will call and schedule an appointment for you early next week. You will need to follow up for further management of your diabetes. You will also need to check your blood sugars at home. Your prescriptions were sent to [facility].   My best,  Trey Paula

## 2020-05-04 NOTE — Progress Notes (Signed)
   Subjective:  Ricky Boyle is a 59 y.o. male living with  HTN, tobacco use, polysubstance use ( cocaine and benzodiazepine), and recurrent pancreatitis with recent admission admitted complicated pancreatitis on hospital day 2  Patient has had improvement in symptoms. Reports mild discomfort in stomach and says he thinks it is because he has not had a bowel movement. We discussed his Insulin regimen and he said he felt comfortable going home and administering Insulin.   Objective:  Vital signs in last 24 hours: Vitals:   05/03/20 1311 05/03/20 1448 05/03/20 2205 05/04/20 0425  BP: (!) 138/95 (!) 143/89 (!) 140/96 133/81  Pulse: 90 (!) 102 93 89  Resp: 16 19 16 20   Temp: 97.9 F (36.6 C) 98.9 F (37.2 C) 98.9 F (37.2 C) 99 F (37.2 C)  TempSrc: Oral Oral Oral Oral  SpO2: 98% 99% 100% 93%  Weight:  113.4 kg  113.2 kg  Height:  5\' 6"  (1.676 m)      General: NAD, nl appearance Cardiovascular: Normal rate, regular rhythm.  No murmurs, rubs, or gallops Pulmonary : Effort normal, breath sounds normal. No wheezes, rales, or rhonchi Abdominal: NT on palpation, no guarding,  bowel sounds present    CBC Latest Ref Rng & Units 05/04/2020 05/03/2020 05/02/2020  WBC 4.0 - 10.5 K/uL 13.1(H) 17.1(H) 14.6(H)  Hemoglobin 13.0 - 17.0 g/dL 12.6(L) 13.7 13.7  Hematocrit 39 - 52 % 38.6(L) 41.7 41.5  Platelets 150 - 400 K/uL 286 300 317    BMP Latest Ref Rng & Units 05/04/2020 05/03/2020 05/02/2020  Glucose 70 - 99 mg/dL 07/03/2020) 07/02/2020) 761(P)  BUN 6 - 20 mg/dL 509(T) 267(TI) 45(Y)  Creatinine 0.61 - 1.24 mg/dL 09(X) 83(J) 8.25(K)  Sodium 135 - 145 mmol/L 136 135 127(L)  Potassium 3.5 - 5.1 mmol/L 4.6 4.9 5.3(H)  Chloride 98 - 111 mmol/L 103 102 93(L)  CO2 22 - 32 mmol/L 24 20(L) 20(L)  Calcium 8.9 - 10.3 mg/dL 9.0 9.2 9.1     Assessment/Plan:  Principal Problem:   Pancreatitis, recurrent Active Problems:   Essential hypertension   Hyperglycemia  Ricky Boyle is a 59 year old male  living with hypertension, hx of polysubstance use disorder, and recurrent pancreatitis here with pancreatic necrosis and pseudocyst with new insulin dependent diabetes.   #Pancreatic necrosis and pseudocyst Tolerating diet well and pain is much improved. GI signed off and okay with patient being discharged. We will have him follow-up with [facility] and recommend scheduling CT scan in 4-5 weeks for surveillance of pseudocyst. Counseled on smoking and cocaine use.   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 Plan to titrate up but patient would like to go home today. He feels comfortable administering Insulin and checking blood glucose. He will also talk to his pharmacist , but says he would like to be discharged today. On Basal 10 units and 5 units with meals, required 45 units short acting total yesterday. Will send patient out on 20 units Lantus and 10 units TID with meals. Also ordering blood glucose meter. He will be scheduled for follow up early next week and can be arranged to see diabetic coordinator in clinic if needed.    Dispo: Anticipated discharge today.    41, MD Internal Medicine PGY-2  Pager: [phone] After 5pm on weekdays and 1pm on weekends: On Call pager [phone]

## 2020-05-04 NOTE — Discharge Summary (Signed)
Name: Ricky Boyle MRN: [id number] DOB: 13-Feb-1961 59 y.o. PCP: Patient, No Pcp Per  Date of Admission: 05/02/2020 10:26 AM Date of Discharge: 05/04/2020 Attending Physician:Vincent Damita Dunnings  Discharge Diagnosis:  Pancreatic pseudocyst Insulin Dependent Diabetes HTN  Discharge Medications: Allergies as of 05/04/2020      Reactions   Bee Venom Anaphylaxis   Shrimp [shellfish Allergy] Anaphylaxis      Medication List    TAKE these medications   amLODipine 10 MG tablet Commonly known as: NORVASC Take 1 tablet (10 mg total) by mouth daily.   blood glucose meter kit and supplies Kit Dispense based on patient and insurance preference. Use up to four times daily as directed. (FOR ICD-9 250.00, 250.01).   Lantus SoloStar 100 UNIT/ML Solostar Pen Generic drug: insulin glargine Inject 20 Units into the skin at bedtime.   multivitamin with minerals tablet Take 1 tablet by mouth daily.   nicotine 21 mg/24hr patch Commonly known as: NICODERM CQ - dosed in mg/24 hours Place 21 mg onto the skin daily.   NovoLOG FlexPen 100 UNIT/ML FlexPen Generic drug: insulin aspart Inject 10 Units into the skin 3 (three) times daily with meals.   senna-docusate 8.6-50 MG tablet Commonly known as: Senokot-S Take 1 tablet by mouth at bedtime as needed for mild constipation or moderate constipation.       Disposition and follow-up:   Mr.Mohmed Pflaum was discharged from [facility] in Stable condition.  At the hospital follow up visit please address:  1. Patient admitted for acute pancreatitis approximately 3 weeks ago. Patient found to have large pancreatic pseudocyst and pancreatic necrosis on this admission. Thought is likely secondary tobacco use disorder or more rare etiology of cocaine use. This has led to insulin dependent diabetes. Please address these things below at hospital follow up:  *Patient started on Lantus 20 units qhs, Novolog 10 units TID. ( patient was  ready for discharge and likely will need further adjustments to regimen)  *Prescribed blood glucose meter and kit, review patient blood glucose log and adjust Insulin regimen as needed. *  Please refer to Debera Lat, [place] RD,  for new diabetes counseling * Schedule follow up CT Scan in 4-5 weeks for surveillance of pseudocyst * Discuss if patient needs more nicotine replacement therapy and success with smoking cessation.   Hx of elevated Metanephrines - consider monitoring additional symptoms and consider further imaging such as iobenguane I-123 or FDG-PET scan once patient is out of acute illness.    2.  Labs / imaging needed at time of follow-up:  3.  Pending labs/ test needing follow-up: na Follow-up Appointments:   Hospital Course by problem list: 1. Pancreatic necrosis and pseudocysts: Clinically stable and did not seem to be in active pancreatitis.  Seems to be having only mild-moderate symptoms from the pseudocysts. GI consulted. Planning for supportive care, no indication for antibiotics or drainage right now.  Advanced diet and tolerated it well. Etiology of the pseudycysts is likely from his recent hospitalizations for pancreatitis, etiology of that was likely related to tobacco use.  New diagnosis diabetes: Blood sugars the last 2 admissions have been steadily rising.  He is admitted with symptomatic hypoglycemia causing polyuria, polydipsia, and dehydration.  No signs of DKA, but certainly at risk for this in the future.  Etiology of this diabetes is almost certainly related to the extensive pancreatic necrosis and loss of endocrine function.  As such she will do best with a insulin based diabetes regimen.  On day of discharge he was on Lantus 10 units and required 45 units of short acting. We have sent him out on Lantus 20 units and Novolog 10 units TID. Put order in for diabetic educator , but not available on weekend. Patient ready for discharge and says he is confident he can  handle taking Insulin. Sent message to [facility] for patient to be schedule early next week.   HTN Hx Elevatedplasmametanephrines -Restarted home amlodopine -history not totally consistent with pheochromocytoma. Monitor additional symptoms and consider further imaging such as iobenguane I-123 or FDG-PET scan once patient is out of acute illness.   ResolvingAKIvs CKD Most recent Cr 1.66. Baseline ~ 1.4. Cr has been trending downward since admission with IV fluids. Patient was likely dehydrated as he presented with hyperglycemia.  -IV fluids for concurrent pancreatitis and clear liquid diet. -daily BMP -avoid nephrotoxins  Dental pain and swelling- resolved There was no growth, swelling, or pain at the R upper lip this AM. Patient reported that "it popped" overnight. Area was likely a cyst. Patient reported that the contents did not pop into his mouth he only noticed this AM that his face was no longer swollen or painful.   Cocaine use disorder Tobacco use disorder Reports no cocaine use since admission 2 weeks ago with hypertensive emergency. States he is smoking 6 cigarettes a day, while he previously used 2 packs per day. Commended his progress and counseled on cessation, sent home with nicotine patch.   Discharge Vitals:   BP 140/86 (BP Location: Right Arm)   Pulse 92   Temp 97.6 F (36.4 C) (Oral)   Resp 20   Ht _0  (1.676 m)   Wt 113.2 kg   SpO2 100%   BMI 40.29 kg/m   Pertinent Labs, Studies, and Procedures:  CBC Latest Ref Rng & Units 05/04/2020 05/03/2020 05/02/2020  WBC 4.0 - 10.5 K/uL 13.1(H) 17.1(H) 14.6(H)  Hemoglobin 13.0 - 17.0 g/dL 12.6(L) 13.7 13.7  Hematocrit 39 - 52 % 38.6(L) 41.7 41.5  Platelets 150 - 400 K/uL 286 300 317   CMP Latest Ref Rng & Units 05/04/2020 05/03/2020 05/02/2020  Glucose 70 - 99 mg/dL 255(H) 211(H) 538(HH)  BUN 6 - 20 mg/dL 30(H) 39(H) 51(H)  Creatinine 0.61 - 1.24 mg/dL 1.59(H) 1.66(H) 2.04(H)  Sodium 135 - 145 mmol/L 136 135 127(L)   Potassium 3.5 - 5.1 mmol/L 4.6 4.9 5.3(H)  Chloride 98 - 111 mmol/L 103 102 93(L)  CO2 22 - 32 mmol/L 24 20(L) 20(L)  Calcium 8.9 - 10.3 mg/dL 9.0 9.2 9.1  Total Protein 6.5 - 8.1 g/dL - - 7.8  Total Bilirubin 0.3 - 1.2 mg/dL - - 0.7  Alkaline Phos 38 - 126 U/L - - 94  AST 15 - 41 U/L - - 15  ALT 0 - 44 U/L - - 25   CT ABDOMEN PELVIS W CONTRAST  Result Date: 05/02/2020 CLINICAL DATA:  59 year old male with lower abdominal pain, fatigue. EXAM: CT ABDOMEN AND PELVIS WITH CONTRAST TECHNIQUE: Multidetector CT imaging of the abdomen and pelvis was performed using the standard protocol following bolus administration of intravenous contrast. CONTRAST:  53m OMNIPAQUE IOHEXOL 300 MG/ML  SOLN COMPARISON:  CTA chest abdomen and pelvis 04/16/2020. CT Abdomen and Pelvis 07/07/2009. FINDINGS: Lower chest: Lung base atelectasis greater on the left. No pericardial or pleural effusion. Hepatobiliary: Vicarious contrast excretion suspected in the gallbladder. Liver enhancement remains within normal limits. No bile duct enlargement. Pancreas: Subtotal pancreatic necrosis with oval pseudo cyst occupying  the pancreatic bed in the lesser sac encompassing 55 x 153 by 60 mm (AP by transverse by CC), estimated volume 200 mL. There is a 2nd developing pseudocyst along the undersurface of the gastric greater curve (series 3, image 29 and sagittal image 79) which appears slightly less organized, measuring 51 by about 72 by 45 mm (AP by transverse by CC). There is preserved enhancement of the pancreatic uncinate and a portion of the pancreatic head. No ductal dilatation identified. Spleen: Negative. Adrenals/Urinary Tract: Adrenal glands remain normal. Kidneys appears stable. Renal enhancement and contrast excretion is symmetric. Unremarkable urinary bladder. Stomach/Bowel: No dilated large or small bowel. Negative appendix. Small volume of oral contrast intermittently in the small intestine. Mild mass effect on the stomach  related to pancreatic pseudocysts described above. Increased perigastric vascular collaterals and mild stranding. The stomach and duodenum are decompressed. Confluent inflammation and/or phlegmon in the small bowel mesentery just distal to the ligament of Treitz (series 3, image 39). Superimposed increased mesenteric vascular collaterals compared to last month. No pneumoperitoneum. No layering free fluid, although there is thickening and nodularity of the left lateral conal fascia continuing into the pelvis. Vascular/Lymphatic: Aortoiliac calcified atherosclerosis. Major arterial structures remain patent. The splenic vein remains patent on series 3, images 26 and 25 although is abutted by the pseudocyst. Elsewhere the portal venous system appears to remain patent. Reproductive: Negative. Other: No pelvic free fluid. Musculoskeletal: No acute osseous abnormality identified. IMPRESSION: 1. Complicated Pancreatitis. 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 - second developing pseudocyst along the undersurface of the greater curve of the stomach, roughly 1/2 the size of the first. - associated phlegmon in the mesentery of the proximal small bowel. 2. Splenic vein and portal vein remain patent, but there are new perigastric and mesenteric collaterals. No bowel obstruction. No biliary ductal dilatation. 3. Aortic Atherosclerosis (ICD10-I70.0). Electronically Signed   By: Genevie Ann M.D.   On: 05/02/2020 19:27    Discharge Instructions: Discharge Instructions    Diet - low sodium heart healthy   Complete by: As directed    Increase activity slowly   Complete by: As directed       Signed: Madalyn Rob, MD 05/04/2020, 10:49 PM   Pager: [phone]

## 2020-05-05 ENCOUNTER — Telehealth: Payer: Self-pay

## 2020-05-05 ENCOUNTER — Telehealth: Payer: Self-pay | Admitting: Student

## 2020-05-05 ENCOUNTER — Encounter: Payer: BLUE CROSS/BLUE SHIELD | Admitting: Student

## 2020-05-05 LAB — IGG 4: IgG, Subclass 4: 36 mg/dL (ref 2–96)

## 2020-05-05 MED ORDER — INSULIN PEN NEEDLE 29G X 12MM MISC: 1.0000 | Freq: Four times a day (QID) | 2 refills | Status: DC

## 2020-05-05 NOTE — Telephone Encounter (Signed)
TOC appt is now scheduled with Dr. Chesley Mires on 05/09/20.  Dr. Chesley Mires, Please see message below and advise. Thank you, Lashana Spang

## 2020-05-05 NOTE — Telephone Encounter (Signed)
Received message that patient had called with questions.  TC to patient and he is asking:  1.  Requesting RX for needles for insulin pens (recently discharged and needles were not sent to pharmacy.  2.  Wants to know how much Lantus to take at bedtime, RN informed patient per RX he is to take 20 units at bedtime.  Pt states he "was informed at discharge he could tweak this according to his blood sugar and is confused now on dosage".  3.  Wants to know when to f/u with Dr. Laddie Aquas.   Will route to Dr. Laddie Aquas.  Patient could also benefit with appt w/ Lupita Leash for diabetic education, if MD agreeable, please place referral in for this. Thank you, SChaplin, RN,BSN

## 2020-05-05 NOTE — Telephone Encounter (Signed)
Pt notified of Dr. Dorothyann Peng instructions and verbalized understanding. SChaplin, RN,BSN

## 2020-05-05 NOTE — Telephone Encounter (Signed)
Pen needles sent. Would advise to take insulin according to discharge instructions. Lantus 20 units daily, and Novolog 10 units TID.   Thanks!

## 2020-05-05 NOTE — Telephone Encounter (Signed)
[facility] 05/09/2020 @ 9:15AM WITH DR BLOOMFIELD.

## 2020-05-06 NOTE — Telephone Encounter (Signed)
Attempted call for Transition Care Management Follow-up Telephone Call.  Call went straight to VM which was non-identifying, RN unable to leave message. SChaplin, RN,BSN

## 2020-05-09 ENCOUNTER — Encounter: Payer: Self-pay | Admitting: Internal Medicine

## 2020-05-09 ENCOUNTER — Ambulatory Visit (INDEPENDENT_AMBULATORY_CARE_PROVIDER_SITE_OTHER): Payer: BLUE CROSS/BLUE SHIELD | Admitting: Internal Medicine

## 2020-05-09 VITALS — BP 120/82 | HR 94 | Temp 98.6°F | Ht 71.0 in | Wt 252.7 lb

## 2020-05-09 DIAGNOSIS — I10 Essential (primary) hypertension: Secondary | ICD-10-CM | POA: Diagnosis not present

## 2020-05-09 DIAGNOSIS — E089 Diabetes mellitus due to underlying condition without complications: Secondary | ICD-10-CM | POA: Diagnosis not present

## 2020-05-09 DIAGNOSIS — K869 Disease of pancreas, unspecified: Secondary | ICD-10-CM | POA: Insufficient documentation

## 2020-05-09 DIAGNOSIS — E1169 Type 2 diabetes mellitus with other specified complication: Secondary | ICD-10-CM | POA: Insufficient documentation

## 2020-05-09 DIAGNOSIS — K8689 Other specified diseases of pancreas: Secondary | ICD-10-CM | POA: Diagnosis not present

## 2020-05-09 MED ORDER — LANTUS SOLOSTAR 100 UNIT/ML ~~LOC~~ SOPN
26.0000 [IU] | PEN_INJECTOR | Freq: Every day | SUBCUTANEOUS | 0 refills | Status: DC
Start: 2020-05-09 — End: 2020-07-11

## 2020-05-09 MED ORDER — NOVOLOG FLEXPEN 100 UNIT/ML ~~LOC~~ SOPN
12.0000 [IU] | PEN_INJECTOR | Freq: Three times a day (TID) | SUBCUTANEOUS | 0 refills | Status: DC
Start: 2020-05-09 — End: 2020-06-10

## 2020-05-09 NOTE — Patient Instructions (Addendum)
Mr. Jenkinson, It was great seeing you today. I'm glad you are making steady improvements since your hospitalization.   I know it's a big lifestyle adjustment, but you are doing a great job with your insulin administration so far.  A few changes:  -increase the lantus to 26 units at bedtime -increase mealtime insulin to 12 units before each meal   I will have Lupita Leash get in touch with you about how to continue working on diet adjustments.   We'll see you back in about 4 weeks.   Blood pressure is perfect today!  Take care, and please call if you have any questions or concerns before then.

## 2020-05-09 NOTE — Assessment & Plan Note (Signed)
Blood pressure at goal on Amlodipine 10 mg daily. Continue current medical management.

## 2020-05-09 NOTE — Assessment & Plan Note (Signed)
Patient presents for hospital follow-up. Admitted for pancreatic pseudocyst and necrosis following acute pancreatitis which was complicated by development of insulin dependent diabetes. He was newly started on insulin this hospitalization.  Since discharge, he admits it has been a challenging adjustment but he is becoming more used to checking blood sugars and administering insulin injections. Current regimen is lantus 22 units qhs and novolog 10 units TID with meals.  Review of his meter shows elevated fasting blood sugars of 250s-300s, as well as elevated post prandial blood sugars.   Plan -increase Lantus to 26 units qhs  -increase Novolog to 12 units TID with meals -referral to Lupita Leash for continued education and meal planning  -would likely be a good candidate for CGM  -f/u in 4 weeks

## 2020-05-09 NOTE — Progress Notes (Signed)
Acute Office Visit  Subjective:    Patient ID: Ricky Boyle, male    DOB: [birth year], 59 y.o.   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 Please see problem based charting for details on today's visit.   Past Medical History:  Diagnosis Date  . AKI (acute kidney injury) ([place])   . Cocaine abuse ([place])   . Hypertension   . Opioid abuse ([place])     No past surgical history on file.  Family History  Problem Relation Age of Onset  . Hypertension Mother   . Hypertension Sister   . Cancer Neg Hx   . Heart attack Neg Hx     Social History   Socioeconomic History  . Marital status: Widowed    Spouse name: Not on file  . Number of children: Not on file  . Years of education: Not on file  . Highest education level: Not on file  Occupational History  . Not on file  Tobacco Use  . Smoking status: Current Every Day Smoker    Packs/day: 0.50    Years: 25.00    Pack years: 12.50    Types: Cigarettes  . Smokeless tobacco: Never Used  Substance and Sexual Activity  . Alcohol use: No  . Drug use: No  . Sexual activity: Yes  Other Topics Concern  . Not on file  Social History Narrative  . Not on file   Social Determinants of Health   Financial Resource Strain:   . Difficulty of Paying Living Expenses: Not on file  Food Insecurity:   . Worried About Charity fundraiser in the Last Year: Not on file  . Ran Out of Food in the Last Year: Not on file  Transportation Needs:   . Lack of Transportation (Medical): Not on file  . Lack of Transportation (Non-Medical): Not on file  Physical Activity:   . Days of Exercise per Week: Not on file  . Minutes of Exercise per Session: Not on file  Stress:   . Feeling of Stress : Not on file  Social Connections:   . Frequency of Communication with Friends and Family: Not on file  . Frequency of  Social Gatherings with Friends and Family: Not on file  . Attends Religious Services: Not on file  . Active Member of Clubs or Organizations: Not on file  . Attends Archivist Meetings: Not on file  . Marital Status: Not on file  Intimate Partner Violence:   . Fear of Current or Ex-Partner: Not on file  . Emotionally Abused: Not on file  . Physically Abused: Not on file  . Sexually Abused: Not on file    Outpatient Medications Prior to Visit  Medication Sig Dispense Refill  . amLODipine (NORVASC) 10 MG tablet Take 1 tablet (10 mg total) by mouth daily. 30 tablet 0  . blood glucose meter kit and supplies KIT Dispense based on patient and insurance preference. Use up to four times daily as directed. (FOR ICD-9 250.00, 250.01). 1 each 0  . Insulin Pen Needle 29G X 12MM MISC 1 Device by Does not apply route in the morning, at noon, in the evening, and at bedtime. 100 each 2  . Multiple Vitamins-Minerals (MULTIVITAMIN WITH MINERALS) tablet Take 1 tablet by mouth daily.    . nicotine (NICODERM CQ - DOSED IN MG/24 HOURS) 21 mg/24hr patch  Place 21 mg onto the skin daily.    Marland Kitchen senna-docusate (SENOKOT-S) 8.6-50 MG tablet Take 1 tablet by mouth at bedtime as needed for mild constipation or moderate constipation. 30 tablet 0  . insulin aspart (NOVOLOG FLEXPEN) 100 UNIT/ML FlexPen Inject 10 Units into the skin 3 (three) times daily with meals. 9 mL 0  . insulin glargine (LANTUS SOLOSTAR) 100 UNIT/ML Solostar Pen Inject 20 Units into the skin at bedtime. 9 mL 0   No facility-administered medications prior to visit.    Allergies  Allergen Reactions  . Bee Venom Anaphylaxis  . Shrimp [Shellfish Allergy] Anaphylaxis   Constitutional: negative for fevers, chills HEENT: negative for vision changes, headaches Resp: negative for cough, shortness of breath Cardiac: negative for chest pain, lower extremity swelling GI: still having some abdominal pain, but tolerating food well; negative for  nausea, vomiting  Endocrine: negative for polyuria, polydipsia, polyphagia  Neuro: negative for numbness/tingling, light headedness, focal weakness       Objective:    Physical Exam Constitutional:      General: He is not in acute distress.    Appearance: Normal appearance.  Cardiovascular:     Rate and Rhythm: Normal rate and regular rhythm.  Pulmonary:     Effort: Pulmonary effort is normal.     Breath sounds: Normal breath sounds.  Abdominal:     General: There is no distension.     Palpations: Abdomen is soft.     Tenderness: There is no abdominal tenderness.  Musculoskeletal:     Comments: Trace edema in BLE  Neurological:     General: No focal deficit present.     Mental Status: He is alert.  Psychiatric:        Mood and Affect: Mood normal.        Behavior: Behavior normal.     BP 120/82 (BP Location: Right Arm, Patient Position: Sitting, Cuff Size: Small)   Pulse 94   Temp 98.6 F (37 C) (Oral)   Ht 5' 11"  (1.803 m)   Wt 252 lb 11.2 oz (114.6 kg)   SpO2 98%   BMI 35.24 kg/m  Wt Readings from Last 3 Encounters:  05/09/20 252 lb 11.2 oz (114.6 kg)  05/04/20 249 lb 9.6 oz (113.2 kg)  05/02/20 252 lb 9.6 oz (114.6 kg)    Health Maintenance Due  Topic Date Due  . PNEUMOCOCCAL POLYSACCHARIDE VACCINE AGE 12-64 HIGH RISK  Never done  . FOOT EXAM  Never done  . OPHTHALMOLOGY EXAM  Never done  . COVID-19 Vaccine (1) Never done  . TETANUS/TDAP  Never done  . COLONOSCOPY  Never done  . INFLUENZA VACCINE  Never done    There are no preventive care reminders to display for this patient.   Lab Results  Component Value Date   TSH 0.761 04/17/2020   Lab Results  Component Value Date   WBC 13.1 (H) 05/04/2020   HGB 12.6 (L) 05/04/2020   HCT 38.6 (L) 05/04/2020   MCV 96.7 05/04/2020   PLT 286 05/04/2020   Lab Results  Component Value Date   NA 136 05/04/2020   K 4.6 05/04/2020   CO2 24 05/04/2020   GLUCOSE 255 (H) 05/04/2020   BUN 30 (H) 05/04/2020     CREATININE 1.59 (H) 05/04/2020   BILITOT 0.7 05/02/2020   ALKPHOS 94 05/02/2020   AST 15 05/02/2020   ALT 25 05/02/2020   PROT 7.8 05/02/2020   ALBUMIN 2.8 (L) 05/02/2020   CALCIUM 9.0  05/04/2020   ANIONGAP 9 05/04/2020   Lab Results  Component Value Date   CHOL 149 04/20/2020   Lab Results  Component Value Date   HDL 17 (L) 04/20/2020   Lab Results  Component Value Date   LDLCALC 87 04/20/2020   Lab Results  Component Value Date   TRIG 224 (H) 04/20/2020   Lab Results  Component Value Date   CHOLHDL 8.8 04/20/2020   Lab Results  Component Value Date   HGBA1C 5.2 04/17/2020       Assessment & Plan:   Problem List Items Addressed This Visit      Cardiovascular and Mediastinum   Essential hypertension    Blood pressure at goal on Amlodipine 10 mg daily. Continue current medical management.         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 Admitted for pancreatic pseudocyst and necrosis following acute pancreatitis which was complicated by development of insulin dependent diabetes. He was newly started on insulin this hospitalization.  Since discharge, he admits it has been a challenging adjustment but he is becoming more used to checking blood sugars and administering insulin injections. Current regimen is lantus 22 units qhs and novolog 10 units TID with meals.  Review of his meter shows elevated fasting blood sugars of 250s-300s, as well as elevated post prandial blood sugars.   Plan -increase Lantus to 26 units qhs  -increase Novolog to 12 units TID with meals -referral to [facility] for continued education and meal planning  -would likely be a good candidate for CGM  -f/u in 4 weeks       Relevant Medications   insulin glargine (LANTUS SOLOSTAR) 100 UNIT/ML Solostar Pen   insulin aspart (NOVOLOG FLEXPEN) 100 UNIT/ML FlexPen   Other Relevant Orders   Referral to  Nut[facility] Diabetes Services       Meds ordered this encounter  Medications  . insulin glargine (LANTUS SOLOSTAR) 100 UNIT/ML Solostar Pen    Sig: Inject 26 Units into the skin at bedtime.    Dispense:  9 mL    Refill:  0  . insulin aspart (NOVOLOG FLEXPEN) 100 UNIT/ML FlexPen    Sig: Inject 12 Units into the skin 3 (three) times daily with meals.    Dispense:  9 mL    Refill:  0     Curtistine Pettitt D Rickie Gutierres, DO

## 2020-05-09 NOTE — Progress Notes (Signed)
Internal Medicine Clinic Attending  Case discussed with Dr. Bloomfield  At the time of the visit.  We reviewed the resident's history and exam and pertinent patient test results.  I agree with the assessment, diagnosis, and plan of care documented in the resident's note.  

## 2020-05-09 NOTE — Progress Notes (Signed)
Internal Medicine Clinic Attending  I saw and evaluated the patient.  I personally confirmed the key portions of the history and exam documented by Dr. Speakman and I reviewed pertinent patient test results.  The assessment, diagnosis, and plan were formulated together and I agree with the documentation in the resident's note.  

## 2020-05-22 ENCOUNTER — Other Ambulatory Visit: Payer: Self-pay | Admitting: Student

## 2020-05-26 ENCOUNTER — Other Ambulatory Visit: Payer: Self-pay | Admitting: Internal Medicine

## 2020-06-03 ENCOUNTER — Other Ambulatory Visit: Payer: Self-pay | Admitting: Internal Medicine

## 2020-06-05 ENCOUNTER — Other Ambulatory Visit: Payer: Self-pay

## 2020-06-05 NOTE — Telephone Encounter (Signed)
Need refill on amLODipine (NORVASC) 10 MG tablet  ;pt contact [phone]   [facility] [phone] - [place], [place] - [address]

## 2020-06-06 ENCOUNTER — Encounter: Payer: BLUE CROSS/BLUE SHIELD | Admitting: Internal Medicine

## 2020-06-06 MED ORDER — AMLODIPINE BESYLATE 10 MG PO TABS
10.0000 mg | ORAL_TABLET | Freq: Every day | ORAL | 0 refills | Status: DC
Start: 2020-06-06 — End: 2020-07-02

## 2020-06-10 ENCOUNTER — Ambulatory Visit (INDEPENDENT_AMBULATORY_CARE_PROVIDER_SITE_OTHER): Payer: BLUE CROSS/BLUE SHIELD | Admitting: Internal Medicine

## 2020-06-10 ENCOUNTER — Encounter: Payer: Self-pay | Admitting: Internal Medicine

## 2020-06-10 ENCOUNTER — Other Ambulatory Visit: Payer: Self-pay

## 2020-06-10 VITALS — BP 139/95 | HR 101 | Temp 98.0°F | Ht 66.0 in | Wt 245.5 lb

## 2020-06-10 DIAGNOSIS — F17200 Nicotine dependence, unspecified, uncomplicated: Secondary | ICD-10-CM

## 2020-06-10 DIAGNOSIS — K8689 Other specified diseases of pancreas: Secondary | ICD-10-CM

## 2020-06-10 DIAGNOSIS — E089 Diabetes mellitus due to underlying condition without complications: Secondary | ICD-10-CM | POA: Diagnosis not present

## 2020-06-10 DIAGNOSIS — I10 Essential (primary) hypertension: Secondary | ICD-10-CM | POA: Diagnosis not present

## 2020-06-10 MED ORDER — NOVOLOG FLEXPEN 100 UNIT/ML ~~LOC~~ SOPN
10.0000 [IU] | PEN_INJECTOR | Freq: Three times a day (TID) | SUBCUTANEOUS | 0 refills | Status: DC
Start: 2020-06-10 — End: 2020-06-24

## 2020-06-10 MED ORDER — ACCU-CHEK GUIDE VI STRP: ORAL_STRIP | 12 refills | Status: DC

## 2020-06-10 MED ORDER — NICOTINE 21 MG/24HR TD PT24: 21.0000 mg | MEDICATED_PATCH | Freq: Every day | TRANSDERMAL | 2 refills | Status: DC

## 2020-06-10 NOTE — Assessment & Plan Note (Signed)
Blood pressure initially 154/87, repeat 139/95.  Patient is on amlodipine 10 mg.  During recent hospital admission, found to have elevated plasma metanephrines.  However this was in the setting of recent cocaine use.  Plan to repeat plasma metanephrines at next visit.

## 2020-06-10 NOTE — Assessment & Plan Note (Addendum)
Patient is currently on Lantus 26 units at bedtime and NovoLog 12 units 3 times daily with meals.  However he reports he has been taking NovoLog 10 units and at times not taking his nighttime dose of Lantus due to blood glucose readings in the 80s to 90s.  His Accu-Chek logbook report shows an average reading of 140, highest 231 lowest 82.  He reports that he has been walking regularly and cutting out sugar from his diet.  He used to drink 4 to 5 cups of coffee from [employer] with sugar, which she is now completely cut out.  He states that he has lost about 35 pounds since his hospital admission.  He is walking regularly.  He endorses bilateral thigh pain.  He describes this as a burning tingling sensation on the lateral side of both thighs with no radiation.  Denies any pain in his feet or lower legs.  Denies any falls or back injuries.  Discussed that this may be related to his recent increase in exercise and will continue to monitor.  Also discussed the importance of continuing Lantus 26 units at bedtime.  Explained that Lantus is a long-acting insulin.  Discussed the signs and symptoms of low blood sugars.  Patient informed on when to hold insulin and/or call the clinic if he has concerns.  Patient's insurance declined consult to dietitian here in our clinic with Tylenol.  He plans to follow with a dietitian at [facility] in February of next year.   Plan: -Continue Lantus 26 units at bedtime and NovoLog 10 units 3 times daily with meals -Will determine if patient qualifies for CGM monitoring -Repeat hemoglobin A1c next visit

## 2020-06-10 NOTE — Patient Instructions (Addendum)
Ricky Boyle,  It was a pleasure meeting you! Today we discussed the following:  1. Diabetes: Please continue to take Lantus 26 units at nighttime and NovoLog 10 units 3 times daily with meals.  I will place an order for CGM monitoring and plan to follow-up with you in 4 weeks, sooner if this is approved.  2. Smoking: I have sent in a prescription for nicotine patch.  If insurance does not cover this he can also consider nicotine gum to help decrease your cravings.    Diabetes Mellitus and Nutrition, Adult When you have diabetes (diabetes mellitus), it is very important to have healthy eating habits because your blood sugar (glucose) levels are greatly affected by what you eat and drink. Eating healthy foods in the appropriate amounts, at about the same times every day, can help you:  Control your blood glucose.  Lower your risk of heart disease.  Improve your blood pressure.  Reach or maintain a healthy weight. Every person with diabetes is different, and each person has different needs for a meal plan. Your health care provider may recommend that you work with a diet and nutrition specialist (dietitian) to make a meal plan that is best for you. Your meal plan may vary depending on factors such as:  The calories you need.  The medicines you take.  Your weight.  Your blood glucose, blood pressure, and cholesterol levels.  Your activity level.  Other health conditions you have, such as heart or kidney disease. How do carbohydrates affect me? Carbohydrates, also called carbs, affect your blood glucose level more than any other type of food. Eating carbs naturally raises the amount of glucose in your blood. Carb counting is a method for keeping track of how many carbs you eat. Counting carbs is important to keep your blood glucose at a healthy level, especially if you use insulin or take certain oral diabetes medicines. It is important to know how many carbs you can safely have in each  meal. This is different for every person. Your dietitian can help you calculate how many carbs you should have at each meal and for each snack. Foods that contain carbs include:  Bread, cereal, rice, pasta, and crackers.  Potatoes and corn.  Peas, beans, and lentils.  Milk and yogurt.  Fruit and juice.  Desserts, such as cakes, cookies, ice cream, and candy. How does alcohol affect me? Alcohol can cause a sudden decrease in blood glucose (hypoglycemia), especially if you use insulin or take certain oral diabetes medicines. Hypoglycemia can be a life-threatening condition. Symptoms of hypoglycemia (sleepiness, dizziness, and confusion) are similar to symptoms of having too much alcohol. If your health care provider says that alcohol is safe for you, follow these guidelines:  Limit alcohol intake to no more than 1 drink per day for nonpregnant women and 2 drinks per day for men. One drink equals 12 oz of beer, 5 oz of wine, or 1 oz of hard liquor.  Do not drink on an empty stomach.  Keep yourself hydrated with water, diet soda, or unsweetened iced tea.  Keep in mind that regular soda, juice, and other mixers may contain a lot of sugar and must be counted as carbs. What are tips for following this plan?  Reading food labels  Start by checking the serving size on the "Nutrition Facts" label of packaged foods and drinks. The amount of calories, carbs, fats, and other nutrients listed on the label is based on one serving of the  item. Many items contain more than one serving per package.  Check the total grams (g) of carbs in one serving. You can calculate the number of servings of carbs in one serving by dividing the total carbs by 15. For example, if a food has 30 g of total carbs, it would be equal to 2 servings of carbs.  Check the number of grams (g) of saturated and trans fats in one serving. Choose foods that have low or no amount of these fats.  Check the number of milligrams  (mg) of salt (sodium) in one serving. Most people should limit total sodium intake to less than 2,300 mg per day.  Always check the nutrition information of foods labeled as "low-fat" or "nonfat". These foods may be higher in added sugar or refined carbs and should be avoided.  Talk to your dietitian to identify your daily goals for nutrients listed on the label. Shopping  Avoid buying canned, premade, or processed foods. These foods tend to be high in fat, sodium, and added sugar.  Shop around the outside edge of the grocery store. This includes fresh fruits and vegetables, bulk grains, fresh meats, and fresh dairy. Cooking  Use low-heat cooking methods, such as baking, instead of high-heat cooking methods like deep frying.  Cook using healthy oils, such as olive, canola, or sunflower oil.  Avoid cooking with butter, cream, or high-fat meats. Meal planning  Eat meals and snacks regularly, preferably at the same times every day. Avoid going long periods of time without eating.  Eat foods high in fiber, such as fresh fruits, vegetables, beans, and whole grains. Talk to your dietitian about how many servings of carbs you can eat at each meal.  Eat 4-6 ounces (oz) of lean protein each day, such as lean meat, chicken, fish, eggs, or tofu. One oz of lean protein is equal to: ? 1 oz of meat, chicken, or fish. ? 1 egg. ?  cup of tofu.  Eat some foods each day that contain healthy fats, such as avocado, nuts, seeds, and fish. Lifestyle  Check your blood glucose regularly.  Exercise regularly as told by your health care provider. This may include: ? 150 minutes of moderate-intensity or vigorous-intensity exercise each week. This could be brisk walking, biking, or water aerobics. ? Stretching and doing strength exercises, such as yoga or weightlifting, at least 2 times a week.  Take medicines as told by your health care provider.  Do not use any products that contain nicotine or  tobacco, such as cigarettes and e-cigarettes. If you need help quitting, ask your health care provider.  Work with a Social worker or diabetes educator to identify strategies to manage stress and any emotional and social challenges. Questions to ask a health care provider  Do I need to meet with a diabetes educator?  Do I need to meet with a dietitian?  What number can I call if I have questions?  When are the best times to check my blood glucose? Where to find more information:  American Diabetes Association: diabetes.org  Academy of Nutrition and Dietetics: www.eatright.[employer] of Diabetes and Digestive and Kidney Diseases (NIH): [employer] Summary  A healthy meal plan will help you control your blood glucose and maintain a healthy lifestyle.  Working with a diet and nutrition specialist (dietitian) can help you make a meal plan that is best for you.  Keep in mind that carbohydrates (carbs) and alcohol have immediate effects on your blood glucose levels. It  is important to count carbs and to use alcohol carefully. This information is not intended to replace advice given to you by your health care provider. Make sure you discuss any questions you have with your health care provider. Document Revised: 07/01/2017 Document Reviewed: 08/23/2016 Elsevier Patient Education  2020 Elsevier Inc.     Carbohydrate Counting for Diabetes Mellitus, Adult  Carbohydrate counting is a method of keeping track of how many carbohydrates you eat. Eating carbohydrates naturally increases the amount of sugar (glucose) in the blood. Counting how many carbohydrates you eat helps keep your blood glucose within normal limits, which helps you manage your diabetes (diabetes mellitus). It is important to know how many carbohydrates you can safely have in each meal. This is different for every person. A diet and nutrition specialist (registered dietitian) can help you make a meal plan and  calculate how many carbohydrates you should have at each meal and snack. Carbohydrates are found in the following foods:  Grains, such as breads and cereals.  Dried beans and soy products.  Starchy vegetables, such as potatoes, peas, and corn.  Fruit and fruit juices.  Milk and yogurt.  Sweets and snack foods, such as cake, cookies, candy, chips, and soft drinks. How do I count carbohydrates? There are two ways to count carbohydrates in food. You can use either of the methods or a combination of both. Reading "Nutrition Facts" on packaged food The "Nutrition Facts" list is included on the labels of almost all packaged foods and beverages in the [place] It includes:  The serving size.  Information about nutrients in each serving, including the grams (g) of carbohydrate per serving. To use the "Nutrition Facts":  Decide how many servings you will have.  Multiply the number of servings by the number of carbohydrates per serving.  The resulting number is the total amount of carbohydrates that you will be having. Learning standard serving sizes of other foods When you eat carbohydrate foods that are not packaged or do not include "Nutrition Facts" on the label, you need to measure the servings in order to count the amount of carbohydrates:  Measure the foods that you will eat with a food scale or measuring cup, if needed.  Decide how many standard-size servings you will eat.  Multiply the number of servings by 15. Most carbohydrate-rich foods have about 15 g of carbohydrates per serving. ? For example, if you eat 8 oz (170 g) of strawberries, you will have eaten 2 servings and 30 g of carbohydrates (2 servings x 15 g = 30 g).  For foods that have more than one food mixed, such as soups and casseroles, you must count the carbohydrates in each food that is included. The following list contains standard serving sizes of common carbohydrate-rich foods. Each of these servings has about 15  g of carbohydrates:   hamburger bun or  English muffin.   oz (15 mL) syrup.   oz (14 g) jelly.  1 slice of bread.  1 six-inch tortilla.  3 oz (85 g) cooked rice or pasta.  4 oz (113 g) cooked dried beans.  4 oz (113 g) starchy vegetable, such as peas, corn, or potatoes.  4 oz (113 g) hot cereal.  4 oz (113 g) mashed potatoes or  of a large baked potato.  4 oz (113 g) canned or frozen fruit.  4 oz (120 mL) fruit juice.  4-6 crackers.  6 chicken nuggets.  6 oz (170 g) unsweetened dry cereal.  6 oz (170 g) plain fat-free yogurt or yogurt sweetened with artificial sweeteners.  8 oz (240 mL) milk.  8 oz (170 g) fresh fruit or one small piece of fruit.  24 oz (680 g) popped popcorn. Example of carbohydrate counting Sample meal  3 oz (85 g) chicken breast.  6 oz (170 g) brown rice.  4 oz (113 g) corn.  8 oz (240 mL) milk.  8 oz (170 g) strawberries with sugar-free whipped topping. Carbohydrate calculation 1. Identify the foods that contain carbohydrates: ? Rice. ? Corn. ? Milk. ? Strawberries. 2. Calculate how many servings you have of each food: ? 2 servings rice. ? 1 serving corn. ? 1 serving milk. ? 1 serving strawberries. 3. Multiply each number of servings by 15 g: ? 2 servings rice x 15 g = 30 g. ? 1 serving corn x 15 g = 15 g. ? 1 serving milk x 15 g = 15 g. ? 1 serving strawberries x 15 g = 15 g. 4. Add together all of the amounts to find the total grams of carbohydrates eaten: ? 30 g + 15 g + 15 g + 15 g = 75 g of carbohydrates total. Summary  Carbohydrate counting is a method of keeping track of how many carbohydrates you eat.  Eating carbohydrates naturally increases the amount of sugar (glucose) in the blood.  Counting how many carbohydrates you eat helps keep your blood glucose within normal limits, which helps you manage your diabetes.  A diet and nutrition specialist (registered dietitian) can help you make a meal plan and  calculate how many carbohydrates you should have at each meal and snack. This information is not intended to replace advice given to you by your health care provider. Make sure you discuss any questions you have with your health care provider. Document Revised: 02/10/2017 Document Reviewed: 12/31/2015 Elsevier Patient Education  2020 [employer].

## 2020-06-10 NOTE — Progress Notes (Signed)
   CC: Diabetes  HPI:  [name] is a 59 y.o. with a PMHx listed below presenting for follow up of his diabetes. For details of today's visit and the status of his chronic medical issues please refer to the assessment and plan.   Past Medical History:  Diagnosis Date  . AKI (acute kidney injury) (HCC)   . Cocaine abuse (HCC)   . Hypertension   . Opioid abuse (HCC)   . Pancreatic pseudocyst    Review of Systems:    Review of Systems  Eyes: Negative for blurred vision, double vision and photophobia.  Respiratory: Negative for cough, sputum production and shortness of breath.   Cardiovascular: Negative for chest pain, palpitations and leg swelling.  Gastrointestinal: Positive for constipation. Negative for abdominal pain, nausea and vomiting.  Genitourinary: Negative for frequency, hematuria and urgency.  Musculoskeletal: Negative for back pain, falls, joint pain and myalgias.  Neurological: Positive for tingling. Negative for dizziness and weakness.    Physical Exam:  Vitals:   06/10/20 0839 06/10/20 0843  BP: (!) 154/87 (!) 139/95  Pulse: (!) 101   Temp: 98 F (36.7 C)   TempSrc: Oral   SpO2: 98%   Weight: 245 lb 8 oz (111.4 kg)   Height: 5\' 6"  (1.676 m)    Physical Exam Constitutional:      General: He is not in acute distress.    Appearance: Normal appearance. He is not ill-appearing.  Cardiovascular:     Rate and Rhythm: Normal rate and regular rhythm.     Pulses: Normal pulses.     Heart sounds: Normal heart sounds. No murmur heard.  No friction rub. No gallop.   Pulmonary:     Effort: Pulmonary effort is normal. No respiratory distress.     Breath sounds: Normal breath sounds. No wheezing or rales.  Abdominal:     General: Abdomen is flat. Bowel sounds are normal. There is no distension.     Palpations: Abdomen is soft.     Tenderness: There is no abdominal tenderness.  Musculoskeletal:        General: No swelling or tenderness.     Right lower  leg: No edema.     Left lower leg: No edema.  Skin:    General: Skin is warm and dry.  Neurological:     General: No focal deficit present.     Mental Status: He is alert and oriented to person, place, and time. Mental status is at baseline.  Psychiatric:        Mood and Affect: Mood normal.        Behavior: Behavior normal.        Thought Content: Thought content normal.        Judgment: Judgment normal.     Assessment & Plan:   See Encounters Tab for problem based charting.  Patient discussed with Dr. 

## 2020-06-11 NOTE — Progress Notes (Signed)
Internal Medicine Clinic Attending  Case discussed with Dr. Rehman at the time of the visit.  We reviewed the resident's history and exam and pertinent patient test results.  I agree with the assessment, diagnosis, and plan of care documented in the resident's note.  Jamaiyah Pyle, M.D., Ph.D.  

## 2020-06-24 ENCOUNTER — Other Ambulatory Visit: Payer: Self-pay | Admitting: Internal Medicine

## 2020-06-25 ENCOUNTER — Telehealth: Payer: Self-pay | Admitting: Dietician

## 2020-06-25 NOTE — Telephone Encounter (Signed)
Called patient to offer a sample CGM.

## 2020-06-30 ENCOUNTER — Telehealth: Payer: Self-pay | Admitting: Internal Medicine

## 2020-06-30 NOTE — Telephone Encounter (Signed)
Refill Request   amLODipine (NORVASC) 10 MG tablet  [facility] [phone] - [place],  - [address]

## 2020-06-30 NOTE — Telephone Encounter (Signed)
#  [phone] sent to [facility] on 06/06/2020. Confirmed with Kathlene November they have this Rx and will get it ready for patient now. Kinnie Feil, BSN, RN-BC

## 2020-07-02 ENCOUNTER — Other Ambulatory Visit: Payer: Self-pay | Admitting: Student

## 2020-07-08 ENCOUNTER — Telehealth: Payer: Self-pay | Admitting: *Deleted

## 2020-07-08 ENCOUNTER — Encounter: Payer: BLUE CROSS/BLUE SHIELD | Admitting: Internal Medicine

## 2020-07-08 NOTE — Telephone Encounter (Signed)
Call to patient about missed appointment .  Was in process of calling.  Appointment rescheduled for 07/15/2020.  Angelina Ok, RN 07/08/2020 9:15 AM.

## 2020-07-11 ENCOUNTER — Other Ambulatory Visit: Payer: Self-pay | Admitting: Internal Medicine

## 2020-07-11 DIAGNOSIS — K8689 Other specified diseases of pancreas: Secondary | ICD-10-CM

## 2020-07-11 DIAGNOSIS — E089 Diabetes mellitus due to underlying condition without complications: Secondary | ICD-10-CM

## 2020-07-15 ENCOUNTER — Encounter: Payer: BLUE CROSS/BLUE SHIELD | Admitting: Internal Medicine

## 2020-07-15 ENCOUNTER — Telehealth: Payer: Self-pay | Admitting: *Deleted

## 2020-07-15 NOTE — Progress Notes (Deleted)
   CC: ***  HPI:  [name] is a 59 y.o. with PMH as below.   Please see A&P for assessment of the patient's acute and chronic medical conditions.   Past Medical History:  Diagnosis Date  . AKI (acute kidney injury) (HCC)   . Cocaine abuse (HCC)   . 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 Hypertension   . Opioid abuse (HCC)   . Pancreatic pseudocyst    Review of Systems:  *** 10 point ROS negative except as noted in HPI  Physical Exam:   Constitution: NAD, appears stated age HENT: Eyes:  Cardio: RRR, no m/r/g, no LE edema  Respiratory: CTA, no w/r/r Abdominal: NTTP, soft, non-distended MSK: moving all extremities Neuro: normal affect, a&ox3 GU: Skin: c/d/i    There were no vitals filed for this visit. ***  Assessment & Plan:   See Encounters Tab for problem based charting.  Patient discussed with Dr. Sandre Boyle

## 2020-07-15 NOTE — Telephone Encounter (Signed)
Call to patient about missed appointment.  Message left on VM to call Clinics to reschedule appointment.  Angelina Ok, RN 07/15/2020 9:50 AM.

## 2020-07-17 ENCOUNTER — Encounter: Payer: Self-pay | Admitting: Dietician

## 2020-07-26 ENCOUNTER — Encounter (HOSPITAL_COMMUNITY): Payer: Self-pay | Admitting: Emergency Medicine

## 2020-07-26 ENCOUNTER — Emergency Department (HOSPITAL_COMMUNITY)
Admission: EM | Admit: 2020-07-26 | Discharge: 2020-07-26 | Disposition: A | Discharge status: Home / Self Care | Payer: BLUE CROSS/BLUE SHIELD | Attending: Emergency Medicine | Admitting: Emergency Medicine

## 2020-07-26 ENCOUNTER — Other Ambulatory Visit: Payer: Self-pay

## 2020-07-26 DIAGNOSIS — Z20822 Contact with and (suspected) exposure to covid-19: Secondary | ICD-10-CM | POA: Insufficient documentation

## 2020-07-26 DIAGNOSIS — R058 Other specified cough: Secondary | ICD-10-CM | POA: Diagnosis not present

## 2020-07-26 DIAGNOSIS — Z5321 Procedure and treatment not carried out due to patient leaving prior to being seen by health care provider: Secondary | ICD-10-CM | POA: Diagnosis not present

## 2020-07-26 LAB — RESP PANEL BY RT-PCR (FLU A&B, COVID) ARPGX2
Influenza A by PCR: NEGATIVE
Influenza B by PCR: NEGATIVE
SARS Coronavirus 2 by RT PCR: NEGATIVE

## 2020-07-26 NOTE — ED Notes (Signed)
Pt states that he is leaving when speaking with registration and left

## 2020-07-26 NOTE — ED Triage Notes (Signed)
Patient requesting Covid test , exposed to her daughter positive for Covid19 , respirations unlabored /afebrile , occasional dry cough .

## 2020-08-15 ENCOUNTER — Telehealth: Payer: Self-pay

## 2020-08-15 ENCOUNTER — Encounter: Payer: BLUE CROSS/BLUE SHIELD | Admitting: Internal Medicine

## 2020-10-02 ENCOUNTER — Other Ambulatory Visit: Payer: Self-pay

## 2020-10-02 MED ORDER — INSULIN PEN NEEDLE 29G X 12MM MISC: 1.0000 | Freq: Four times a day (QID) | 2 refills | Status: DC

## 2020-10-02 MED ORDER — BLOOD GLUCOSE MONITOR KIT: PACK | 0 refills | Status: DC

## 2020-10-02 NOTE — Telephone Encounter (Signed)
Amlodipine #90 with 1 refill sent 07/02/2020. Attempted to notify patient to contact pharmacy for refill but recording states call cannot be completed at this time.

## 2020-10-02 NOTE — Addendum Note (Signed)
Addended by: Fredderick Severance on: 10/02/2020 03:31 PM   Modules accepted: Orders

## 2020-10-02 NOTE — Telephone Encounter (Signed)
pt is requesting his amLODipine (NORVASC) 10 MG tablet and Insulin Pen Needle 29G X 12MM MISC and blood glucose meter kit and supplies KIT sent to  [facility] [phone] - [place], [address] AT [address] [place] Phone:  [phone]  Fax:  [phone]      ( pt stated that his meter is broken  For two weeks now so he was not able to take his BS)

## 2020-10-03 ENCOUNTER — Other Ambulatory Visit: Payer: Self-pay | Admitting: Internal Medicine

## 2020-10-03 MED ORDER — BLOOD GLUCOSE MONITOR KIT: PACK | 0 refills | Status: DC

## 2020-10-07 ENCOUNTER — Other Ambulatory Visit: Payer: Self-pay | Admitting: Internal Medicine

## 2020-10-07 ENCOUNTER — Encounter: Payer: BLUE CROSS/BLUE SHIELD | Admitting: Internal Medicine

## 2020-10-07 DIAGNOSIS — I10 Essential (primary) hypertension: Secondary | ICD-10-CM

## 2020-10-07 DIAGNOSIS — E089 Diabetes mellitus due to underlying condition without complications: Secondary | ICD-10-CM

## 2020-10-07 MED ORDER — BLOOD GLUCOSE MONITOR KIT
PACK | 0 refills | Status: DC
Start: 2020-10-07 — End: 2021-03-16

## 2020-10-07 NOTE — Progress Notes (Deleted)
   CC: ***  HPI:  [name] is a 59 y.o. with PMH as below.   Please see A&P for assessment of the patient's acute and chronic medical conditions.   Past Medical History:  Diagnosis Date  . AKI (acute kidney injury) (HCC)   . Cocaine abuse (HCC)   . 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 Hypertension   . Opioid abuse (HCC)   . Pancreatic pseudocyst    Review of Systems:  *** 10 point ROS negative except as noted in HPI  Physical Exam:   Constitution: NAD, appears stated age HENT: Eyes:  Cardio: RRR, no m/r/g, no LE edema  Respiratory: CTA, no w/r/r Abdominal: NTTP, soft, non-distended MSK: moving all extremities Neuro: normal affect, a&ox3 GU: Skin: c/d/i    There were no vitals filed for this visit. ***  Assessment & Plan:   See Encounters Tab for problem based charting.  Patient {GC/GE:[id number]::"discussed with","seen with"} Dr. {NAMES:[id number]::"Butcher","Granfortuna","E. Hoffman","Klima","Mullen","Narendra","Raines","Vincent"}

## 2020-10-21 ENCOUNTER — Other Ambulatory Visit: Payer: Self-pay

## 2020-10-21 ENCOUNTER — Emergency Department (HOSPITAL_COMMUNITY)
Admission: EM | Admit: 2020-10-21 | Discharge: 2020-10-21 | Disposition: A | Discharge status: Home / Self Care | Payer: BLUE CROSS/BLUE SHIELD | Attending: Emergency Medicine | Admitting: Emergency Medicine

## 2020-10-21 DIAGNOSIS — Z79899 Other long term (current) drug therapy: Secondary | ICD-10-CM | POA: Diagnosis not present

## 2020-10-21 DIAGNOSIS — Z794 Long term (current) use of insulin: Secondary | ICD-10-CM | POA: Insufficient documentation

## 2020-10-21 DIAGNOSIS — R739 Hyperglycemia, unspecified: Secondary | ICD-10-CM

## 2020-10-21 DIAGNOSIS — I10 Essential (primary) hypertension: Secondary | ICD-10-CM | POA: Insufficient documentation

## 2020-10-21 DIAGNOSIS — F17210 Nicotine dependence, cigarettes, uncomplicated: Secondary | ICD-10-CM | POA: Diagnosis not present

## 2020-10-21 DIAGNOSIS — E1165 Type 2 diabetes mellitus with hyperglycemia: Secondary | ICD-10-CM | POA: Diagnosis not present

## 2020-10-21 LAB — CBC
HCT: 45.5 % (ref 39.0–52.0)
Hemoglobin: 15.1 g/dL (ref 13.0–17.0)
MCH: 31.5 pg (ref 26.0–34.0)
MCHC: 33.2 g/dL (ref 30.0–36.0)
MCV: 94.8 fL (ref 80.0–100.0)
Platelets: 242 10*3/uL (ref 150–400)
RBC: 4.8 MIL/uL (ref 4.22–5.81)
RDW: 13.2 % (ref 11.5–15.5)
WBC: 7.2 10*3/uL (ref 4.0–10.5)
nRBC: 0 % (ref 0.0–0.2)

## 2020-10-21 LAB — URINALYSIS, ROUTINE W REFLEX MICROSCOPIC
Bacteria, UA: NONE SEEN
Bilirubin Urine: NEGATIVE
Glucose, UA: >=500 mg/dL — AB
Hgb urine dipstick: NEGATIVE
Ketones, ur: 20 mg/dL — AB
Leukocytes,Ua: NEGATIVE
Nitrite: NEGATIVE
Protein, ur: NEGATIVE mg/dL
Specific Gravity, Urine: 1.023 (ref 1.005–1.030)
pH: 6 (ref 5.0–8.0)

## 2020-10-21 LAB — I-STAT CHEM 8, ED
BUN: 22 mg/dL — ABNORMAL HIGH (ref 6–20)
Calcium, Ion: 1.15 mmol/L (ref 1.15–1.40)
Chloride: 97 mmol/L — ABNORMAL LOW (ref 98–111)
Creatinine, Ser: 1 mg/dL (ref 0.61–1.24)
Glucose, Bld: 670 mg/dL (ref 70–99)
HCT: 47 % (ref 39.0–52.0)
Hemoglobin: 16 g/dL (ref 13.0–17.0)
Potassium: 4.3 mmol/L (ref 3.5–5.1)
Sodium: 133 mmol/L — ABNORMAL LOW (ref 135–145)
TCO2: 27 mmol/L (ref 22–32)

## 2020-10-21 LAB — I-STAT VENOUS BLOOD GAS, ED
Acid-Base Excess: 2 mmol/L (ref 0.0–2.0)
Bicarbonate: 30 mmol/L — ABNORMAL HIGH (ref 20.0–28.0)
Calcium, Ion: 1.18 mmol/L (ref 1.15–1.40)
HCT: 49 % (ref 39.0–52.0)
Hemoglobin: 16.7 g/dL (ref 13.0–17.0)
O2 Saturation: 85 %
Potassium: 4.5 mmol/L (ref 3.5–5.1)
Sodium: 132 mmol/L — ABNORMAL LOW (ref 135–145)
TCO2: 32 mmol/L (ref 22–32)
pCO2, Ven: 55.7 mmHg (ref 44.0–60.0)
pH, Ven: 7.339 (ref 7.250–7.430)
pO2, Ven: 54 mmHg — ABNORMAL HIGH (ref 32.0–45.0)

## 2020-10-21 LAB — CBG MONITORING, ED
Glucose-Capillary: >600 mg/dL (ref 70–99)
Glucose-Capillary: 457 mg/dL — ABNORMAL HIGH (ref 70–99)
Glucose-Capillary: 451 mg/dL — ABNORMAL HIGH (ref 70–99)

## 2020-10-21 LAB — BASIC METABOLIC PANEL
Anion gap: 11 (ref 5–15)
BUN: 19 mg/dL (ref 6–20)
CO2: 25 mmol/L (ref 22–32)
Calcium: 9.3 mg/dL (ref 8.9–10.3)
Chloride: 95 mmol/L — ABNORMAL LOW (ref 98–111)
Creatinine, Ser: 1.21 mg/dL (ref 0.61–1.24)
GFR, Estimated: >60 mL/min (ref >60)
Glucose, Bld: 627 mg/dL (ref 70–99)
Potassium: 4.2 mmol/L (ref 3.5–5.1)
Sodium: 131 mmol/L — ABNORMAL LOW (ref 135–145)

## 2020-10-21 MED ORDER — INSULIN ASPART 100 UNIT/ML ~~LOC~~ SOLN
8.0000 [IU] | Freq: Once | SUBCUTANEOUS | Status: AC
  Administered 2020-10-21: 8 [IU] via SUBCUTANEOUS

## 2020-10-21 MED ORDER — SODIUM CHLORIDE 0.9 % IV BOLUS
2000.0000 mL | Freq: Once | INTRAVENOUS | Status: AC
  Administered 2020-10-21: 2000 mL via INTRAVENOUS

## 2020-10-21 NOTE — ED Notes (Signed)
Patient given discharge paperwork and instructions. Verbalized understanding of teaching. IV d/c with cath tip intact. Ambulatory to exit in NAD with steady gait. 

## 2020-10-21 NOTE — ED Provider Notes (Signed)
[facility] EMERGENCY DEPARTMENT Provider Note   CSN: [id number] Arrival date & time: 10/21/20  1307     History Chief Complaint  Patient presents with  . Hyperglycemia    Ricky Boyle is a 60 y.o. male.  HPI   Pt is a 60 y/o male with a h/o AKI, cocaine abuse, DM, HTN, opioid abuse, pancreatic pseudocyst who presents to the ED today for eval of hyperglycemia.  Pt states he does not have a glucometer and ran out of insulin needles and he was worried that his blood sugar was high so he came to the ED to get it checked. Reports he has been essentially asymptomatic.  Reports chronic polyuria and polydipsia that is unchanged. Denies any current abd pain, nausea, vomiting, fevers, cough, congestion, rhinorrhea, chest pain, shortness of breath.  He had some diarrhea 4 days ago that has since improved. He denies any other complaints.  Past Medical History:  Diagnosis Date  . AKI (acute kidney injury) ([place])   . Cocaine abuse ([place])   . 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 Hypertension   . Opioid abuse ([place])   . Pancreatic pseudocyst     Patient Active Problem List   Diagnosis Date Noted  . 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 Pancreatitis, recurrent 05/02/2020  . Hyperglycemia 04/25/2020  . Acute renal failure (ARF) ([place]) 04/20/2020  . ARF (acute renal failure) ([place]) 04/20/2020  . Essential hypertension 04/20/2020  . Acute kidney injury ([place]) 04/18/2020  . Acute pancreatitis 04/17/2020  . Asymptomatic hypertensive urgency 04/17/2020  . Cocaine use 04/17/2020  . Abdominal pain 04/16/2020  . OSA (obstructive sleep apnea) 06/16/2016    No past surgical history on file.     Family History  Problem Relation Age of Onset  . Hypertension Mother   . Hypertension Sister   . Cancer Neg Hx   . Heart attack Neg Hx     Social History   Tobacco Use  . Smoking status: Current Every Day Smoker     Packs/day: 0.50    Years: 25.00    Pack years: 12.50    Types: Cigarettes  . Smokeless tobacco: Never Used  Substance Use Topics  . Alcohol use: No  . Drug use: No    Home Medications Prior to Admission medications   Medication Sig Start Date End Date Taking? Authorizing Provider  Accu-Chek Softclix Lancets lancets USE AS DIRECTED UP TO 4 TIMES DAILY 05/26/20   Marianna Payment, MD  amLODipine (NORVASC) 10 MG tablet TAKE 1 TABLET(10 MG) BY MOUTH DAILY 07/02/20   Cato Mulligan, MD  blood glucose meter kit and supplies KIT The patient is insulin requiring, ICD 10 code E11.9. The patient tests 3 times per day. 10/07/20   Seawell, Jaimie A, DO  glucose blood (ACCU-CHEK GUIDE) test strip Use as instructed 06/10/20   Oda Kilts, MD  insulin aspart (NOVOLOG FLEXPEN) 100 UNIT/ML FlexPen Inject 10 Units into the skin 3 (three) times daily with meals. 06/24/20   Asencion Noble, MD  Insulin Pen Needle 29G X 12MM MISC 1 Device by Does not apply route in the morning, at noon, in the evening, and at bedtime. 10/02/20   Seawell, Jaimie A, DO  LANTUS SOLOSTAR 100 UNIT/ML Solostar Pen ADMINISTER 26 UNITS UNDER THE SKIN AT BEDTIME 07/14/20   Seawell, Jaimie A, DO  Multiple Vitamins-Minerals (MULTIVITAMIN WITH MINERALS) tablet Take 1 tablet by mouth daily.    [provider]  nicotine (NICODERM CQ - DOSED IN MG/24 HOURS) 21 mg/24hr patch Place 1 patch (21 mg total) onto the skin daily. 06/10/20   Oda Kilts, MD  senna-docusate (SENOKOT-S) 8.6-50 MG tablet Take 1 tablet by mouth at bedtime as needed for mild constipation or moderate constipation. 05/04/20   Madalyn Rob, MD    Allergies    Bee venom, Shrimp [shellfish allergy], and Penicillins  Review of Systems   Review of Systems  Constitutional: Negative for fever.  HENT: Negative for ear pain and sore throat.   Eyes: Negative for visual disturbance.  Respiratory: Negative for cough and shortness of breath.    Cardiovascular: Negative for chest pain.  Gastrointestinal: Negative for abdominal pain, constipation, diarrhea, nausea and vomiting.  Endocrine: Positive for polydipsia and polyuria.  Genitourinary: Negative for dysuria and hematuria.  Musculoskeletal: Negative for back pain.  Skin: Negative for rash.  Neurological: Negative for seizures and syncope.  All other systems reviewed and are negative.   Physical Exam Updated Vital Signs BP (!) 167/80   Pulse 62   Temp 98.3 F (36.8 C)   Resp 20   SpO2 94%   Physical Exam Vitals and nursing note reviewed.  Constitutional:      Appearance: He is well-developed.  HENT:     Head: Normocephalic and atraumatic.     Mouth/Throat:     Mouth: Mucous membranes are dry.  Eyes:     Conjunctiva/sclera: Conjunctivae normal.  Cardiovascular:     Rate and Rhythm: Normal rate and regular rhythm.     Pulses: Normal pulses.     Heart sounds: Normal heart sounds. No murmur heard.   Pulmonary:     Effort: Pulmonary effort is normal. No respiratory distress.     Breath sounds: Normal breath sounds. No wheezing, rhonchi or rales.  Abdominal:     General: Bowel sounds are normal.     Palpations: Abdomen is soft.     Tenderness: There is no abdominal tenderness. There is no guarding or rebound.  Musculoskeletal:     Cervical back: Neck supple.  Skin:    General: Skin is warm and dry.  Neurological:     Mental Status: He is alert.     ED Results / Procedures / Treatments   Labs (all labs ordered are listed, but only abnormal results are displayed) Labs Reviewed  BASIC METABOLIC PANEL - Abnormal; Notable for the following components:      Result Value   Sodium 131 (*)    Chloride 95 (*)    Glucose, Bld 627 (*)    All other components within normal limits  URINALYSIS, ROUTINE W REFLEX MICROSCOPIC - Abnormal; Notable for the following components:   Color, Urine STRAW (*)    Glucose, UA >=500 (*)    Ketones, ur 20 (*)    All other  components within normal limits  CBG MONITORING, ED - Abnormal; Notable for the following components:   Glucose-Capillary >600 (*)    All other components within normal limits  I-STAT CHEM 8, ED - Abnormal; Notable for the following components:   Sodium 133 (*)    Chloride 97 (*)    BUN 22 (*)    Glucose, Bld 670 (*)    All other components within normal limits  I-STAT VENOUS BLOOD GAS, ED - Abnormal; Notable for the following components:   pO2, Ven 54.0 (*)    Bicarbonate 30.0 (*)    Sodium 132 (*)    All other  components within normal limits  CBG MONITORING, ED - Abnormal; Notable for the following components:   Glucose-Capillary 457 (*)    All other components within normal limits  CBG MONITORING, ED - Abnormal; Notable for the following components:   Glucose-Capillary 451 (*)    All other components within normal limits  CBC  CBG MONITORING, ED    EKG EKG Interpretation  Date/Time:  Tuesday October 21 2020 16:33:50 EDT Ventricular Rate:  59 PR Interval:    QRS Duration: 100 QT Interval:  406 QTC Calculation: 403 R Axis:   -7 Text Interpretation: Sinus rhythm Consider left atrial enlargement ST elevation, consider anterior injury No significant change since last tracing Confirmed by Madalyn Rob [phone]) on 10/21/2020 6:42:47 PM   Radiology No results found.  Procedures Procedures   Medications Ordered in ED Medications  sodium chloride 0.9 % bolus 2,000 mL (0 mLs Intravenous Stopped 10/21/20 1750)  insulin aspart (novoLOG) injection 8 Units (8 Units Subcutaneous Given 10/21/20 1640)  insulin aspart (novoLOG) injection 8 Units (8 Units Subcutaneous Given 10/21/20 1805)    ED Course  I have reviewed the triage vital signs and the nursing notes.  Pertinent labs & imaging results that were available during my care of the patient were reviewed by me and considered in my medical decision making (see chart for details).    MDM Rules/Calculators/A&P                           60 year old male presents to the emergency department today to have his blood sugar checked.  Ran out of insulin needles and does not have a glucometer at home so was worried that his blood sugar was high.  He is essentially asymptomatic at this time.  Reviewed/interpreted labs CBC is unremarkable BMP shows an elevated blood glucose but normal bicarb and no elevated anion gap UA shows glucosuria and some ketonuria, no evidence of UTI VBG does not show evidence of acidosis  TOC was consulted in regards to medication assistance however the patient has insurance so does not qualify for this benefit. I appreciate their involvement in this patient's care.   Pt was given IVF, insulin and on recheck his CBG is improving. He is still hyperglycemic as he had some cheetohs while in the ED, but he is requesting to be discharged as he states his daughter was able to assist him financially in getting his insulin needles. He states he does not need another prescription at the time and will resume his home meds. He does not appear to be in DKA At this time and I do not think he requires admission. Feel he is appropriate for d/c to f/u with pcp. Advised him to return if worse. He voices understanding and is in agreement with plan. All questions answered, pt stable for discharge.    Final Clinical Impression(s) / ED Diagnoses Final diagnoses:  Hyperglycemia    Rx / DC Orders ED Discharge Orders    None       Bishop Dublin 10/21/20 1852    Lucrezia Starch, MD 10/22/20 2149

## 2020-10-21 NOTE — Discharge Instructions (Signed)
Please fill your prescription for insulin and resume your home medications.  Please follow up with your primary care provider within 5-7 days for re-evaluation of your symptoms. If you do not have a primary care provider, information for a healthcare clinic has been provided for you to make arrangements for follow up care. Please return to the emergency department for any new or worsening symptoms.

## 2020-10-21 NOTE — ED Triage Notes (Signed)
Stated unable to check sugar lately and lack of insulin x 3 days.

## 2020-10-21 NOTE — ED Notes (Signed)
Glucose 627 per lab at this time

## 2020-11-22 ENCOUNTER — Other Ambulatory Visit: Payer: Self-pay | Admitting: Internal Medicine

## 2020-11-22 DIAGNOSIS — K8689 Other specified diseases of pancreas: Secondary | ICD-10-CM

## 2020-11-22 DIAGNOSIS — E089 Diabetes mellitus due to underlying condition without complications: Secondary | ICD-10-CM

## 2020-11-26 ENCOUNTER — Encounter: Payer: BLUE CROSS/BLUE SHIELD | Admitting: Internal Medicine

## 2020-12-04 ENCOUNTER — Emergency Department (HOSPITAL_COMMUNITY)
Admission: EM | Admit: 2020-12-04 | Discharge: 2020-12-04 | Disposition: A | Discharge status: Home / Self Care | Payer: BLUE CROSS/BLUE SHIELD | Attending: Emergency Medicine | Admitting: Emergency Medicine

## 2020-12-04 ENCOUNTER — Other Ambulatory Visit: Payer: Self-pay

## 2020-12-04 DIAGNOSIS — R059 Cough, unspecified: Secondary | ICD-10-CM | POA: Diagnosis present

## 2020-12-04 DIAGNOSIS — E119 Type 2 diabetes mellitus without complications: Secondary | ICD-10-CM | POA: Diagnosis not present

## 2020-12-04 DIAGNOSIS — Z79899 Other long term (current) drug therapy: Secondary | ICD-10-CM | POA: Diagnosis not present

## 2020-12-04 DIAGNOSIS — Z794 Long term (current) use of insulin: Secondary | ICD-10-CM | POA: Insufficient documentation

## 2020-12-04 DIAGNOSIS — Z20822 Contact with and (suspected) exposure to covid-19: Secondary | ICD-10-CM | POA: Insufficient documentation

## 2020-12-04 DIAGNOSIS — I10 Essential (primary) hypertension: Secondary | ICD-10-CM | POA: Diagnosis not present

## 2020-12-04 DIAGNOSIS — F17210 Nicotine dependence, cigarettes, uncomplicated: Secondary | ICD-10-CM | POA: Insufficient documentation

## 2020-12-04 DIAGNOSIS — B349 Viral infection, unspecified: Secondary | ICD-10-CM | POA: Diagnosis not present

## 2020-12-04 LAB — SARS CORONAVIRUS 2 (TAT 6-24 HRS): SARS Coronavirus 2: NEGATIVE

## 2020-12-04 NOTE — ED Provider Notes (Signed)
[facility] EMERGENCY DEPARTMENT Provider Note   CSN: [id number] Arrival date & time: 12/04/20  1204     History No chief complaint on file.   Ricky Boyle is a 60 y.o. male.  Pt reports he has a cough and congestion.  Pt reports everyone at his job is sick.  Pt request a covid test.  Pt denies shortness of breath   The history is provided by the patient. No language interpreter was used.  Cough Cough characteristics:  Productive Sputum characteristics:  Nondescript Severity:  Moderate Onset quality:  Gradual Timing:  Constant Progression:  Worsening Chronicity:  New Smoker: no   Context: sick contacts   Relieved by:  Nothing Worsened by:  Nothing Ineffective treatments:  None tried Associated symptoms: no chest pain, no fever and no shortness of breath   Risk factors: no recent infection   Pt took his bp medications just before he came in     Past Medical History:  Diagnosis Date  . AKI (acute kidney injury) ([place])   . Cocaine abuse ([place])   . 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 Hypertension   . Opioid abuse ([place])   . Pancreatic pseudocyst     Patient Active Problem List   Diagnosis Date Noted  . 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 Pancreatitis, recurrent 05/02/2020  . Hyperglycemia 04/25/2020  . Acute renal failure (ARF) ([place]) 04/20/2020  . ARF (acute renal failure) ([place]) 04/20/2020  . Essential hypertension 04/20/2020  . Acute kidney injury ([place]) 04/18/2020  . Acute pancreatitis 04/17/2020  . Asymptomatic hypertensive urgency 04/17/2020  . Cocaine use 04/17/2020  . Abdominal pain 04/16/2020  . OSA (obstructive sleep apnea) 06/16/2016    No past surgical history on file.     Family History  Problem Relation Age of Onset  . Hypertension Mother   . Hypertension Sister   . Cancer Neg Hx   . Heart attack Neg Hx     Social History   Tobacco Use  . Smoking  status: Current Every Day Smoker    Packs/day: 0.50    Years: 25.00    Pack years: 12.50    Types: Cigarettes  . Smokeless tobacco: Never Used  Substance Use Topics  . Alcohol use: No  . Drug use: No    Home Medications Prior to Admission medications   Medication Sig Start Date End Date Taking? Authorizing Provider  Accu-Chek Softclix Lancets lancets USE AS DIRECTED UP TO 4 TIMES DAILY 05/26/20   Marianna Payment, MD  amLODipine (NORVASC) 10 MG tablet TAKE 1 TABLET(10 MG) BY MOUTH DAILY 07/02/20   Cato Mulligan, MD  blood glucose meter kit and supplies KIT The patient is insulin requiring, ICD 10 code E11.9. The patient tests 3 times per day. 10/07/20   Seawell, Jaimie A, DO  CONTOUR NEXT TEST test strip USE TO CHECK BLOOD GLUCOSE 3 TIMES A DAY 11/24/20   Iona Beard, MD  insulin aspart (NOVOLOG FLEXPEN) 100 UNIT/ML FlexPen Inject 10 Units into the skin 3 (three) times daily with meals. 06/24/20   Asencion Noble, MD  Insulin Pen Needle 29G X 12MM MISC 1 Device by Does not apply route in the morning, at noon, in the evening, and at bedtime. 10/02/20   Seawell, Jaimie A, DO  LANTUS SOLOSTAR 100 UNIT/ML Solostar Pen ADMINISTER 26 UNITS UNDER THE SKIN AT BEDTIME 07/14/20   Seawell, Jaimie A, DO  Multiple Vitamins-Minerals (MULTIVITAMIN WITH MINERALS) tablet Take  1 tablet by mouth daily.    [provider]  nicotine (NICODERM CQ - DOSED IN MG/24 HOURS) 21 mg/24hr patch Place 1 patch (21 mg total) onto the skin daily. 06/10/20   Oda Kilts, MD  senna-docusate (SENOKOT-S) 8.6-50 MG tablet Take 1 tablet by mouth at bedtime as needed for mild constipation or moderate constipation. 05/04/20   Madalyn Rob, MD    Allergies    Bee venom, Shrimp [shellfish allergy], and Penicillins  Review of Systems   Review of Systems  Constitutional: Negative for fever.  Respiratory: Positive for cough. Negative for shortness of breath.   Cardiovascular: Negative for chest pain.  All other  systems reviewed and are negative.   Physical Exam Updated Vital Signs BP (!) 183/115 (BP Location: Right Arm)   Pulse 92   Temp 98.5 F (36.9 C) (Oral)   Resp 18   SpO2 100%   Physical Exam Vitals and nursing note reviewed.  Constitutional:      Appearance: He is well-developed.  HENT:     Head: Normocephalic and atraumatic.  Eyes:     Conjunctiva/sclera: Conjunctivae normal.  Cardiovascular:     Rate and Rhythm: Normal rate and regular rhythm.     Heart sounds: No murmur heard.   Pulmonary:     Effort: Pulmonary effort is normal. No respiratory distress.     Breath sounds: Normal breath sounds.  Abdominal:     Palpations: Abdomen is soft.     Tenderness: There is no abdominal tenderness.  Musculoskeletal:        General: Normal range of motion.     Cervical back: Neck supple.  Skin:    General: Skin is warm and dry.  Neurological:     General: No focal deficit present.     Mental Status: He is alert.  Psychiatric:        Mood and Affect: Mood normal.     ED Results / Procedures / Treatments   Labs (all labs ordered are listed, but only abnormal results are displayed) Labs Reviewed - No data to display  EKG None  Radiology No results found.  Procedures Procedures   Medications Ordered in ED Medications - No data to display  ED Course  I have reviewed the triage vital signs and the nursing notes.  Pertinent labs & imaging results that were available during my care of the patient were reviewed by me and considered in my medical decision making (see chart for details).    MDM Rules/Calculators/A&P                         covid ordered.  Pt counseled on viral illness  Final Clinical Impression(s) / ED Diagnoses Final diagnoses:  Viral illness    Rx / DC Orders ED Discharge Orders    None    An After Visit Summary was printed and given to the patient.    Sidney Ace 12/04/20 1218    Davonna Belling, MD 12/04/20 [phone]

## 2020-12-04 NOTE — Discharge Instructions (Addendum)
Your covid test is pending 

## 2020-12-04 NOTE — ED Triage Notes (Signed)
Pt asking for covid test. NAD. Hypertensive, but sts that is his normal.

## 2021-01-14 ENCOUNTER — Encounter (HOSPITAL_COMMUNITY): Payer: Self-pay | Admitting: *Deleted

## 2021-01-14 ENCOUNTER — Emergency Department (HOSPITAL_COMMUNITY)
Admission: EM | Admit: 2021-01-14 | Discharge: 2021-01-14 | Disposition: A | Discharge status: Home / Self Care | Payer: BLUE CROSS/BLUE SHIELD | Attending: Emergency Medicine | Admitting: Emergency Medicine

## 2021-01-14 ENCOUNTER — Other Ambulatory Visit: Payer: Self-pay

## 2021-01-14 DIAGNOSIS — Z5321 Procedure and treatment not carried out due to patient leaving prior to being seen by health care provider: Secondary | ICD-10-CM | POA: Insufficient documentation

## 2021-01-14 DIAGNOSIS — L02423 Furuncle of right upper limb: Secondary | ICD-10-CM | POA: Insufficient documentation

## 2021-01-14 NOTE — ED Triage Notes (Signed)
Pt complains of boil on right forearm x 2 weeks. He tried popping it but it became worse.

## 2021-01-15 ENCOUNTER — Encounter (HOSPITAL_COMMUNITY): Payer: Self-pay

## 2021-01-15 ENCOUNTER — Emergency Department (HOSPITAL_COMMUNITY)
Admission: EM | Admit: 2021-01-15 | Discharge: 2021-01-15 | Disposition: A | Discharge status: Home / Self Care | Payer: BLUE CROSS/BLUE SHIELD | Attending: Emergency Medicine | Admitting: Emergency Medicine

## 2021-01-15 DIAGNOSIS — Z79899 Other long term (current) drug therapy: Secondary | ICD-10-CM | POA: Insufficient documentation

## 2021-01-15 DIAGNOSIS — Z794 Long term (current) use of insulin: Secondary | ICD-10-CM | POA: Diagnosis not present

## 2021-01-15 DIAGNOSIS — L02413 Cutaneous abscess of right upper limb: Secondary | ICD-10-CM | POA: Insufficient documentation

## 2021-01-15 DIAGNOSIS — F17210 Nicotine dependence, cigarettes, uncomplicated: Secondary | ICD-10-CM | POA: Diagnosis not present

## 2021-01-15 DIAGNOSIS — L0291 Cutaneous abscess, unspecified: Secondary | ICD-10-CM

## 2021-01-15 DIAGNOSIS — I10 Essential (primary) hypertension: Secondary | ICD-10-CM | POA: Diagnosis not present

## 2021-01-15 DIAGNOSIS — L02423 Furuncle of right upper limb: Secondary | ICD-10-CM | POA: Diagnosis present

## 2021-01-15 DIAGNOSIS — E119 Type 2 diabetes mellitus without complications: Secondary | ICD-10-CM | POA: Insufficient documentation

## 2021-01-15 MED ORDER — SULFAMETHOXAZOLE-TRIMETHOPRIM 800-160 MG PO TABS
1.0000 | ORAL_TABLET | Freq: Once | ORAL | Status: AC
  Administered 2021-01-15: 1 via ORAL
  Filled 2021-01-15: qty 1

## 2021-01-15 MED ORDER — SULFAMETHOXAZOLE-TRIMETHOPRIM 800-160 MG PO TABS: 1.0000 | ORAL_TABLET | Freq: Two times a day (BID) | ORAL | 0 refills | Status: AC

## 2021-01-15 MED ORDER — LIDOCAINE-EPINEPHRINE (PF) 2 %-1:200000 IJ SOLN
10.0000 mL | Freq: Once | INTRAMUSCULAR | Status: AC
  Administered 2021-01-15: 10 mL
  Filled 2021-01-15: qty 20

## 2021-01-15 NOTE — ED Notes (Signed)
Pt discharged from this ED in stable condition at this time. Dressing applied. All discharge instructions and follow up care reviewed with pt with no further questions at this time. Pt ambulatory with steady gait, clear speech.

## 2021-01-15 NOTE — ED Provider Notes (Signed)
[place] DEPT Provider Note   CSN: [id number] Arrival date & time: 01/15/21  4481     History Chief Complaint  Patient presents with   Arm Pain    Ricky Boyle is a 60 y.o. male.  Pt presents to the ED today with a boil to his right forearm.  It has been there for about 2 weeks.  He tried to lance it himself, but it did not work.  Pt said he works at [employer] and is right handed.  He is constantly using his arms and it is painful.  He denies any f/c.      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 No pertinent surgical history.     Family History  Problem Relation Age of Onset   Hypertension Mother    Hypertension Sister    Cancer Neg Hx    Heart attack Neg Hx     Social History   Tobacco Use   Smoking status: Every Day    Packs/day: 0.50    Years: 25.00    Pack years: 12.50    Types: Cigarettes   Smokeless tobacco: Never  Substance Use Topics   Alcohol use: No   Drug use: No    Home Medications Prior to Admission medications   Medication Sig Start Date End Date Taking? Authorizing Provider  sulfamethoxazole-trimethoprim (BACTRIM DS) 800-160 MG tablet Take 1 tablet by mouth 2 (two)  times daily for 7 days. 01/15/21 01/22/21 Yes Isla Pence, MD  Accu-Chek Softclix Lancets lancets USE AS DIRECTED UP TO 4 TIMES DAILY 05/26/20   Marianna Payment, MD  amLODipine (NORVASC) 10 MG tablet TAKE 1 TABLET(10 MG) BY MOUTH DAILY 07/02/20   Cato Mulligan, MD  blood glucose meter kit and supplies KIT The patient is insulin requiring, ICD 10 code E11.9. The patient tests 3 times per day. 10/07/20   Seawell, Jaimie A, DO  CONTOUR NEXT TEST test strip USE TO CHECK BLOOD GLUCOSE 3 TIMES A DAY 11/24/20   Iona Beard, MD  insulin aspart (NOVOLOG FLEXPEN) 100 UNIT/ML FlexPen Inject 10 Units into the skin 3 (three) times daily with meals. 06/24/20   Asencion Noble, MD  Insulin Pen Needle 29G X 12MM MISC 1 Device by Does not apply route in the morning, at noon, in the evening, and at bedtime. 10/02/20   Seawell, Jaimie A, DO  LANTUS SOLOSTAR 100 UNIT/ML Solostar Pen ADMINISTER 26 UNITS UNDER THE SKIN AT BEDTIME 07/14/20   Seawell, Jaimie A, DO  Multiple Vitamins-Minerals (MULTIVITAMIN WITH MINERALS) tablet Take 1 tablet by mouth daily.    [provider]  nicotine (NICODERM CQ - DOSED IN MG/24 HOURS) 21 mg/24hr patch Place 1 patch (21 mg total) onto the skin  daily. 06/10/20   Oda Kilts, MD  senna-docusate (SENOKOT-S) 8.6-50 MG tablet Take 1 tablet by mouth at bedtime as needed for mild constipation or moderate constipation. 05/04/20   Madalyn Rob, MD    Allergies    Bee venom, Shrimp [shellfish allergy], and Penicillins  Review of Systems   Review of Systems  Skin:  Positive for wound.  All other systems reviewed and are negative.  Physical Exam Updated Vital Signs BP (!) 178/114 (BP Location: Left Arm)   Pulse 89   Temp 97.9 F (36.6 C) (Oral)   Resp 16   Ht _0  (1.676 m)   Wt 96.3 kg   SpO2 97%   BMI 34.25 kg/m   Physical Exam Vitals and nursing note reviewed.  Constitutional:      Appearance: Normal appearance.  HENT:     Head: Normocephalic and  atraumatic.     Right Ear: External ear normal.     Left Ear: External ear normal.     Nose: Nose normal.     Mouth/Throat:     Mouth: Mucous membranes are moist.     Pharynx: Oropharynx is clear.  Eyes:     Extraocular Movements: Extraocular movements intact.     Conjunctiva/sclera: Conjunctivae normal.     Pupils: Pupils are equal, round, and reactive to light.  Cardiovascular:     Rate and Rhythm: Normal rate and regular rhythm.     Pulses: Normal pulses.     Heart sounds: Normal heart sounds.  Pulmonary:     Effort: Pulmonary effort is normal.     Breath sounds: Normal breath sounds.  Abdominal:     General: Abdomen is flat. Bowel sounds are normal.     Palpations: Abdomen is soft.  Musculoskeletal:        General: Normal range of motion.     Cervical back: Normal range of motion and neck supple.  Skin:    General: Skin is warm.     Capillary Refill: Capillary refill takes less than 2 seconds.     Comments: Abscess to right forearm.    Neurological:     General: No focal deficit present.     Mental Status: He is alert and oriented to person, place, and time.  Psychiatric:        Mood and Affect: Mood normal.        Behavior: Behavior normal.        Thought Content: Thought content normal.        Judgment: Judgment normal.    ED Results / Procedures / Treatments   Labs (all labs ordered are listed, but only abnormal results are displayed) Labs Reviewed - No data to display  EKG None  Radiology No results found.  Procedures .Marland KitchenIncision and Drainage  Date/Time: 01/15/2021 7:29 AM Performed by: Isla Pence, MD Authorized by: Isla Pence, MD   Consent:    Consent obtained:  Verbal Universal protocol:    Patient identity confirmed:  Verbally with patient Location:    Type:  Abscess   Location:  Upper extremity   Upper extremity location:  Arm   Arm location:  R lower arm Pre-procedure details:    Skin preparation:  Chlorhexidine Sedation:     Sedation type:  None Anesthesia:    Anesthesia method:  Local infiltration Procedure type:    Complexity:  Simple Procedure details:    Ultrasound guidance: no     Incision types:  Cruciate   Wound management:  Probed  and deloculated   Drainage amount:  Scant   Wound treatment:  Wound left open   Packing materials:  None Post-procedure details:    Procedure completion:  Tolerated well, no immediate complications   Medications Ordered in ED Medications  lidocaine-EPINEPHrine (XYLOCAINE W/EPI) 2 %-1:200000 (PF) injection 10 mL (10 mLs Infiltration Given by Other 01/15/21 0713)  sulfamethoxazole-trimethoprim (BACTRIM DS) 800-160 MG per tablet 1 tablet (1 tablet Oral Given 01/15/21 8421)    ED Course  I have reviewed the triage vital signs and the nursing notes.  Pertinent labs & imaging results that were available during my care of the patient were reviewed by me and considered in my medical decision making (see chart for details).    MDM Rules/Calculators/A&P                          Abscess was mainly solid.  Very little pus expressed.  Pt will be started on Bactrim to see if that helps.  If not, then he will need to see surgery.  Return if worse.  Final Clinical Impression(s) / ED Diagnoses Final diagnoses:  Abscess    Rx / DC Orders ED Discharge Orders          Ordered    sulfamethoxazole-trimethoprim (BACTRIM DS) 800-160 MG tablet  2 times daily        01/15/21 0731             Isla Pence, MD 01/15/21 [id number]

## 2021-01-15 NOTE — ED Triage Notes (Signed)
Pt presents from home. C/o boil/abscess to R forearm x 2 wks. States he tried to pop it at home without success. Denies fevers or drainage

## 2021-02-03 ENCOUNTER — Encounter: Payer: Self-pay | Admitting: *Deleted

## 2021-02-26 ENCOUNTER — Emergency Department (HOSPITAL_COMMUNITY): Payer: BLUE CROSS/BLUE SHIELD

## 2021-02-26 ENCOUNTER — Emergency Department (HOSPITAL_COMMUNITY)
Admission: EM | Admit: 2021-02-26 | Discharge: 2021-02-27 | Disposition: A | Discharge status: Home / Self Care | Payer: BLUE CROSS/BLUE SHIELD | Source: Home / Self Care | Attending: Emergency Medicine | Admitting: Emergency Medicine

## 2021-02-26 ENCOUNTER — Other Ambulatory Visit: Payer: Self-pay

## 2021-02-26 ENCOUNTER — Emergency Department (HOSPITAL_COMMUNITY)
Admission: EM | Admit: 2021-02-26 | Discharge: 2021-02-26 | Disposition: A | Discharge status: Home / Self Care | Payer: BLUE CROSS/BLUE SHIELD | Attending: Emergency Medicine | Admitting: Emergency Medicine

## 2021-02-26 ENCOUNTER — Emergency Department (HOSPITAL_COMMUNITY)
Admission: EM | Admit: 2021-02-26 | Discharge: 2021-02-26 | Disposition: A | Discharge status: Home / Self Care | Payer: BLUE CROSS/BLUE SHIELD | Source: Home / Self Care

## 2021-02-26 ENCOUNTER — Encounter (HOSPITAL_COMMUNITY): Payer: Self-pay

## 2021-02-26 DIAGNOSIS — E139 Other specified diabetes mellitus without complications: Secondary | ICD-10-CM | POA: Insufficient documentation

## 2021-02-26 DIAGNOSIS — M791 Myalgia, unspecified site: Secondary | ICD-10-CM | POA: Insufficient documentation

## 2021-02-26 DIAGNOSIS — F17210 Nicotine dependence, cigarettes, uncomplicated: Secondary | ICD-10-CM | POA: Insufficient documentation

## 2021-02-26 DIAGNOSIS — Z794 Long term (current) use of insulin: Secondary | ICD-10-CM | POA: Insufficient documentation

## 2021-02-26 DIAGNOSIS — R059 Cough, unspecified: Secondary | ICD-10-CM

## 2021-02-26 DIAGNOSIS — Z8616 Personal history of COVID-19: Secondary | ICD-10-CM | POA: Insufficient documentation

## 2021-02-26 DIAGNOSIS — U071 COVID-19: Secondary | ICD-10-CM | POA: Insufficient documentation

## 2021-02-26 DIAGNOSIS — I10 Essential (primary) hypertension: Secondary | ICD-10-CM | POA: Insufficient documentation

## 2021-02-26 DIAGNOSIS — R079 Chest pain, unspecified: Secondary | ICD-10-CM | POA: Insufficient documentation

## 2021-02-26 DIAGNOSIS — Z79899 Other long term (current) drug therapy: Secondary | ICD-10-CM | POA: Insufficient documentation

## 2021-02-26 DIAGNOSIS — Z5321 Procedure and treatment not carried out due to patient leaving prior to being seen by health care provider: Secondary | ICD-10-CM | POA: Insufficient documentation

## 2021-02-26 DIAGNOSIS — R197 Diarrhea, unspecified: Secondary | ICD-10-CM

## 2021-02-26 DIAGNOSIS — Z20822 Contact with and (suspected) exposure to covid-19: Secondary | ICD-10-CM

## 2021-02-26 LAB — CBC WITH DIFFERENTIAL/PLATELET
Abs Immature Granulocytes: 0.04 10*3/uL (ref 0.00–0.07)
Basophils Absolute: 0 10*3/uL (ref 0.0–0.1)
Basophils Relative: 1 %
Eosinophils Absolute: 0.1 10*3/uL (ref 0.0–0.5)
Eosinophils Relative: 1 %
HCT: 48.2 % (ref 39.0–52.0)
Hemoglobin: 16.6 g/dL (ref 13.0–17.0)
Immature Granulocytes: 1 %
Lymphocytes Relative: 13 %
Lymphs Abs: 1.1 10*3/uL (ref 0.7–4.0)
MCH: 33.1 pg (ref 26.0–34.0)
MCHC: 34.4 g/dL (ref 30.0–36.0)
MCV: 96.2 fL (ref 80.0–100.0)
Monocytes Absolute: 1.2 10*3/uL — ABNORMAL HIGH (ref 0.1–1.0)
Monocytes Relative: 15 %
Neutro Abs: 6 10*3/uL (ref 1.7–7.7)
Neutrophils Relative %: 69 %
Platelets: 232 10*3/uL (ref 150–400)
RBC: 5.01 MIL/uL (ref 4.22–5.81)
RDW: 12.8 % (ref 11.5–15.5)
WBC: 8.6 10*3/uL (ref 4.0–10.5)
nRBC: 0 % (ref 0.0–0.2)

## 2021-02-26 LAB — RESP PANEL BY RT-PCR (FLU A&B, COVID) ARPGX2
Influenza A by PCR: NEGATIVE
Influenza B by PCR: NEGATIVE
SARS Coronavirus 2 by RT PCR: POSITIVE — AB

## 2021-02-26 LAB — BASIC METABOLIC PANEL
Anion gap: 8 (ref 5–15)
BUN: 15 mg/dL (ref 6–20)
CO2: 23 mmol/L (ref 22–32)
Calcium: 9.3 mg/dL (ref 8.9–10.3)
Chloride: 105 mmol/L (ref 98–111)
Creatinine, Ser: 1.42 mg/dL — ABNORMAL HIGH (ref 0.61–1.24)
GFR, Estimated: 57 mL/min — ABNORMAL LOW (ref >60)
Glucose, Bld: 113 mg/dL — ABNORMAL HIGH (ref 70–99)
Potassium: 3.6 mmol/L (ref 3.5–5.1)
Sodium: 136 mmol/L (ref 135–145)

## 2021-02-26 LAB — TROPONIN I (HIGH SENSITIVITY): Troponin I (High Sensitivity): 8 ng/L (ref <18)

## 2021-02-26 MED ORDER — PROCHLORPERAZINE EDISYLATE 10 MG/2ML IJ SOLN
10.0000 mg | Freq: Once | INTRAMUSCULAR | Status: AC
  Administered 2021-02-26: 10 mg via INTRAMUSCULAR
  Filled 2021-02-26: qty 2

## 2021-02-26 MED ORDER — ACETAMINOPHEN 325 MG PO TABS: 650.0000 mg | ORAL_TABLET | Freq: Once | ORAL | Status: DC

## 2021-02-26 MED ORDER — ACETAMINOPHEN 500 MG PO TABS
1000.0000 mg | ORAL_TABLET | Freq: Once | ORAL | Status: AC
  Administered 2021-02-26: 1000 mg via ORAL
  Filled 2021-02-26: qty 2

## 2021-02-26 NOTE — ED Provider Notes (Signed)
Emergency Medicine Provider Triage Evaluation Note  Ricky Boyle , a 60 y.o. male  was evaluated in triage.  Pt complains of presents with chest pressure, started today, he has no shortness of breath, has not become diaphoretic, nausea, vomiting lightheaded or dizziness.  He has no cardiac history, no history of PEs or DVTs.  Was recently diagnosed with COVID today, he also concerned that his blood pressure is elevated, took an extra dose of his blood pressure medication but still elevated blood pressure.  No headaches, change in vision no paresthesia weakness upper or lower extremities.  Patient is up-to-date on his COVID-vaccine did not get the booster so.  Review of Systems  Positive: Chest pain, general body aches Negative: Shortness of breath, abdominal pains  Physical Exam  BP (!) 193/122   Pulse (!) 106   Resp 18   SpO2 98%  Gen:   Awake, no distress   Resp:  Normal effort  MSK:   Moves extremities without difficulty  Other:  No focal deficits present, no facial asymmetry, able to follow commands, no slurring of his words, no unilateral weakness.  Medical Decision Making  Medically screening exam initiated at 7:23 PM.  Appropriate orders placed.  Ricky Boyle was informed that the remainder of the evaluation will be completed by another provider, this initial triage assessment does not replace that evaluation, and the importance of remaining in the ED until their evaluation is complete.  Patient presents with chest pain and elevated blood pressure patient will need further eval in the emergency department.   Carroll Sage, PA-C 02/26/21 1926    Mancel Bale, MD 02/27/21 [phone]

## 2021-02-26 NOTE — ED Triage Notes (Addendum)
Patient is A&Ox4 ambulatory. Here for covid symptoms and elevated bp. Seen at [facility] today.

## 2021-02-26 NOTE — ED Triage Notes (Signed)
Pt presents to  ED for evaluation of COVID symptoms stating he has had CP, fever, chills, coughing, diarrhea and generalized aches since yesterday. Was seen at [facility] and diagnosed with COVID, reports symptoms have progressed since then. Denies any active SOB at this time.

## 2021-02-26 NOTE — ED Notes (Signed)
Pt seen walking out of lobby with no mention of where he was going.

## 2021-02-26 NOTE — ED Provider Notes (Signed)
[place] DEPT Provider Note   CSN: [id number] Arrival date & time: 02/26/21  7048     History No chief complaint on file.   Ricky Boyle is a 60 y.o. male.  He is here for COVID test.  He said he started yesterday with nonproductive cough loose stool and feeling feverish.  He went to work today and they made him come here for evaluation.  He did have some work exposures with others who have tested positive.  He has vaccinated but not boosted.  Denies any abdominal pain chest pain shortness of breath.  The history is provided by the patient.  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[place])    Hypertension    Opioid abuse ([place])    Pancreatic pseudocyst     Patient Active Problem List   Diagnosis Date Noted   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     Family History  Problem Relation Age of Onset   Hypertension Mother    Hypertension Sister    Cancer Neg Hx     Heart attack Neg Hx     Social History   Tobacco Use   Smoking status: Every Day    Packs/day: 0.50    Years: 25.00    Pack years: 12.50    Types: Cigarettes   Smokeless tobacco: Never  Substance Use Topics   Alcohol use: No   Drug use: No    Home Medications Prior to Admission medications   Medication Sig Start Date End Date Taking? Authorizing Provider  Accu-Chek Softclix Lancets lancets USE AS DIRECTED UP TO 4 TIMES DAILY 05/26/20   Marianna Payment, MD  amLODipine (NORVASC) 10 MG tablet TAKE 1 TABLET(10 MG) BY MOUTH DAILY 07/02/20   Cato Mulligan, MD  blood glucose meter kit and supplies KIT The patient is insulin requiring, ICD 10 code E11.9. The patient tests 3 times per day. 10/07/20   Seawell, Jaimie A, DO  CONTOUR NEXT TEST test strip USE TO CHECK BLOOD GLUCOSE 3 TIMES A DAY 11/24/20   Iona Beard, MD  insulin aspart (NOVOLOG FLEXPEN) 100 UNIT/ML FlexPen Inject 10 Units into the skin 3 (three) times daily with meals. 06/24/20   Asencion Noble, MD  Insulin Pen Needle 29G X 12MM MISC 1 Device by Does not apply route in the morning, at noon, in the evening, and at bedtime. 10/02/20   Seawell, Jaimie A, DO  LANTUS SOLOSTAR 100 UNIT/ML Solostar Pen ADMINISTER 26 UNITS  UNDER THE SKIN AT BEDTIME 07/14/20   Seawell, Jaimie A, DO  Multiple Vitamins-Minerals (MULTIVITAMIN WITH MINERALS) tablet Take 1 tablet by mouth daily.    [provider]  nicotine (NICODERM CQ - DOSED IN MG/24 HOURS) 21 mg/24hr patch Place 1 patch (21 mg total) onto the skin daily. 06/10/20   Oda Kilts, MD  senna-docusate (SENOKOT-S) 8.6-50 MG tablet Take 1 tablet by mouth at bedtime as needed for mild constipation or moderate constipation. 05/04/20   Madalyn Rob, MD    Allergies    Bee venom, Shrimp [shellfish allergy], and Penicillins  Review of Systems   Review of Systems  Constitutional:  Positive for fever.  HENT:  Negative for sore throat.   Eyes:  Negative for visual disturbance.   Respiratory:  Positive for cough. Negative for shortness of breath.   Cardiovascular:  Negative for chest pain.  Gastrointestinal:  Positive for diarrhea. Negative for vomiting.  Genitourinary:  Negative for dysuria.  Musculoskeletal:  Negative for myalgias.  Skin:  Negative for rash.  Neurological:  Negative for headaches.   Physical Exam Updated Vital Signs BP (!) 190/115 (BP Location: Left Arm)   Pulse 92   Temp 98.2 F (36.8 C) (Oral)   Ht _0  (1.676 m)   Wt 95.3 kg   SpO2 97%   BMI 33.89 kg/m   Physical Exam Vitals and nursing note reviewed.  Constitutional:      Appearance: Normal appearance. He is well-developed.  HENT:     Head: Normocephalic and atraumatic.  Eyes:     Conjunctiva/sclera: Conjunctivae normal.  Cardiovascular:     Rate and Rhythm: Normal rate and regular rhythm.     Heart sounds: No murmur heard. Pulmonary:     Effort: Pulmonary effort is normal. No respiratory distress.     Breath sounds: Normal breath sounds.  Abdominal:     Palpations: Abdomen is soft.     Tenderness: There is no abdominal tenderness. There is no guarding or rebound.  Musculoskeletal:        General: No deformity or signs of injury. Normal range of motion.     Cervical back: Neck supple.  Skin:    General: Skin is warm and dry.  Neurological:     General: No focal deficit present.     Mental Status: He is alert.    ED Results / Procedures / Treatments   Labs (all labs ordered are listed, but only abnormal results are displayed) Labs Reviewed  RESP PANEL BY RT-PCR (FLU A&B, COVID) ARPGX2 - Abnormal; Notable for the following components:      Result Value   SARS Coronavirus 2 by RT PCR POSITIVE (*)    All other components within normal limits    EKG None  Radiology No results found.  Procedures Procedures   Medications Ordered in ED Medications - No data to display  ED Course  I have reviewed the triage vital signs and the nursing notes.  Pertinent  labs & imaging results that were available during my care of the patient were reviewed by me and considered in my medical decision making (see chart for details).    MDM Rules/Calculators/A&P                          Demaryius Imran was evaluated in Emergency Department on 02/26/2021 for the symptoms described in the history of present illness. He was evaluated in the context of the global COVID-19 pandemic,  which necessitated consideration that the patient might be at risk for infection with the SARS-CoV-2 virus that causes COVID-19. Institutional protocols and algorithms that pertain to the evaluation of patients at risk for COVID-19 are in a state of rapid change based on information released by regulatory bodies including the CDC and federal and state organizations. These policies and algorithms were followed during the patient's care in the ED.   Final Clinical Impression(s) / ED Diagnoses Final diagnoses:  Person under investigation for COVID-19  Cough in adult  Diarrhea, unspecified type    Rx / DC Orders ED Discharge Orders     None        Hayden Rasmussen, MD 02/26/21 1734

## 2021-02-26 NOTE — ED Triage Notes (Signed)
Pt states he has had multiple covid exposures at his work place. He states he is vaccinated.

## 2021-02-26 NOTE — ED Provider Notes (Signed)
[facility] DEPT Provider Note  CSN: [id number] Arrival date & time: 02/26/21 2210  Chief Complaint(s) Covid Positive and Generalized Body Aches  HPI Ricky Boyle is a 60 y.o. male   The history is provided by the patient.  Illness Location:  All over Quality:  Aching Severity:  Moderate Onset quality:  Gradual Duration:  1 day Timing:  Constant Progression:  Worsening Chronicity:  New Context:  Test + for covid today. 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 Authorizing Provider  amLODipine (NORVASC) 10 MG tablet TAKE 1 TABLET(10 MG) BY MOUTH DAILY 07/02/20  Yes Cato Mulligan, MD  blood glucose meter kit and supplies KIT The patient is insulin requiring, ICD 10 code E11.9. The patient tests 3 times per day. 10/07/20  Yes Seawell, Jaimie A, DO  CONTOUR NEXT TEST test  strip USE TO CHECK BLOOD GLUCOSE 3 TIMES A DAY 11/24/20  Yes Iona Beard, MD  insulin aspart (NOVOLOG FLEXPEN) 100 UNIT/ML FlexPen Inject 10 Units into the skin 3 (three) times daily with meals. 06/24/20  Yes Asencion Noble, MD  Insulin Pen Needle 29G X 12MM MISC 1 Device by Does not apply route in the morning, at noon, in the evening, and at bedtime. 10/02/20  Yes Seawell, Jaimie A, DO  LANTUS SOLOSTAR 100 UNIT/ML Solostar Pen ADMINISTER 26 UNITS UNDER THE SKIN AT BEDTIME 07/14/20  Yes Seawell, Jaimie A, DO  nirmatrelvir/ritonavir EUA, renal dosing, (PAXLOVID) TBPK Take 2 tablets by mouth 2 (two) times daily for 5 days. Patient GFR is 57. Take nirmatrelvir (150 mg) one tablet twice daily for 5 days and ritonavir (100 mg) one tablet twice daily for 5 days. 02/27/21 03/04/21 Yes Olvin Rohr, Grayce Sessions, MD  Accu-Chek Softclix Lancets lancets USE AS DIRECTED UP TO 4 TIMES DAILY 05/26/20   Marianna Payment, MD  Past Surgical History No past surgical history on file. Family History Family History  Problem Relation Age of Onset   Hypertension Mother    Hypertension Sister    Cancer Neg Hx    Heart attack Neg Hx     Social History Social History   Tobacco Use   Smoking status: Every Day    Packs/day: 0.50    Years: 25.00    Pack years: 12.50    Types: Cigarettes   Smokeless tobacco: Never  Substance Use Topics   Alcohol use: No   Drug use: No   Allergies Bee venom, Shrimp [shellfish allergy], and Penicillins  Review of Systems Review of Systems  Constitutional:  Positive for fatigue and fever.  HENT:  Positive for congestion and rhinorrhea.   Respiratory:  Positive for cough. Negative for shortness of breath.   Cardiovascular:  Negative for chest pain.  Gastrointestinal:  Positive for diarrhea. Negative for abdominal pain and vomiting.  Musculoskeletal:   Positive for myalgias.  Neurological:  Positive for headaches.  All other systems are reviewed and are negative for acute change except as noted in the HPI  Physical Exam Vital Signs  I have reviewed the triage vital signs BP (!) 164/112   Pulse 81   Temp 100.2 F (37.9 C) (Oral)   Resp 16   SpO2 94%   Physical Exam Vitals reviewed.  Constitutional:      General: He is not in acute distress.    Appearance: He is well-developed. He is not diaphoretic.  HENT:     Head: Normocephalic and atraumatic.     Nose: Nose normal.  Eyes:     General: No scleral icterus.       Right eye: No discharge.        Left eye: No discharge.     Conjunctiva/sclera: Conjunctivae normal.     Pupils: Pupils are equal, round, and reactive to light.  Cardiovascular:     Rate and Rhythm: Normal rate and regular rhythm.     Heart sounds: No murmur heard.   No friction rub. No gallop.  Pulmonary:     Effort: Pulmonary effort is normal. No respiratory distress.     Breath sounds: Normal breath sounds. No stridor. No rales.  Abdominal:     General: There is no distension.     Palpations: Abdomen is soft.     Tenderness: There is no abdominal tenderness.  Musculoskeletal:        General: No tenderness.     Cervical back: Normal range of motion and neck supple.  Skin:    General: Skin is warm and dry.     Findings: No erythema or rash.  Neurological:     Mental Status: He is alert and oriented to person, place, and time.    ED Results and Treatments Labs (all labs ordered are listed, but only abnormal results are displayed) Labs Reviewed - No data to display  EKG  EKG Interpretation  Date/Time:    Ventricular Rate:    PR Interval:    QRS Duration:   QT Interval:    QTC Calculation:   R Axis:     Text Interpretation:         Radiology DG Chest 1 View  Result Date:  02/26/2021 CLINICAL DATA:  Chest pain.  Fever. EXAM: CHEST  1 VIEW COMPARISON:  April 20, 2020. FINDINGS: The heart size and mediastinal contours are within normal limits. Both lungs are clear. The visualized skeletal structures are unremarkable. IMPRESSION: No active disease. Electronically Signed   By: Marijo Conception M.D.   On: 02/26/2021 20:04    Pertinent labs & imaging results that were available during my care of the patient were reviewed by me and considered in my medical decision making (see chart for details).  Medications Ordered in ED Medications  acetaminophen (TYLENOL) tablet 1,000 mg (1,000 mg Oral Given 02/26/21 2358)  prochlorperazine (COMPAZINE) injection 10 mg (10 mg Intramuscular Given 02/26/21 2359)                                                                                                                                    Procedures Procedures  (including critical care time)  Medical Decision Making / ED Course I have reviewed the nursing notes for this encounter and the patient's prior records (if available in EHR or on provided paperwork).   Ricky Boyle was evaluated in Emergency Department on 02/27/2021 for the symptoms described in the history of present illness. He was evaluated in the context of the global COVID-19 pandemic, which necessitated consideration that the patient might be at risk for infection with the SARS-CoV-2 virus that causes COVID-19. Institutional protocols and algorithms that pertain to the evaluation of patients at risk for COVID-19 are in a state of rapid change based on information released by regulatory bodies including the CDC and federal and state organizations. These policies and algorithms were followed during the patient's care in the ED.  Patient presents with viral symptoms for 2 days who tested + for COVID. Adequate oral hydration. Rest of history as above.  Patient appears well. No signs of toxicity, patient is interactive. No  hypoxia, tachypnea or other signs of respiratory distress. No sign of clinical dehydration. Lung exam clear. Rest of exam as above.  He was seen [facility] earlier this evening but left due to wait times. Labs reassuring. CXR negative also.  No evidence suggestive of pharyngitis, AOM, PNA, or meningitis.  Given Tylenol and compazine. Symptoms completely resolved. BP improved. Will Rx Paxlovid  Discussed symptomatic treatment with the patient and they will follow closely with their PCP.        Final Clinical Impression(s) / ED Diagnoses Final diagnoses:  COVID-19 virus infection   The patient appears reasonably screened and/or stabilized for discharge and I doubt any other medical condition or other [facility] requiring  further screening, evaluation, or treatment in the ED at this time prior to discharge. Safe for discharge with strict return precautions.  Disposition: Discharge  Condition: Good  I have discussed the results, Dx and Tx plan with the patient/family who expressed understanding and agree(s) with the plan. Discharge instructions discussed at length. The patient/family was given strict return precautions who verbalized understanding of the instructions. No further questions at time of discharge.    ED Discharge Orders          Ordered    nirmatrelvir/ritonavir EUA, renal dosing, (PAXLOVID) TBPK  2 times daily        02/27/21 0302             Follow Up: Primary care provider  Call  as needed      This chart was dictated using voice recognition software.  Despite best efforts to proofread,  errors can occur which can change the documentation meaning.    Fatima Blank, MD 02/27/21 [phone]

## 2021-02-26 NOTE — Discharge Instructions (Addendum)
You were seen in the emergency department for evaluation of COVID-like symptoms.  You had a COVID test done and is pending at time of discharge.  You can follow-up your results in MyChart.  If you are COVID-positive you will need to isolate for 5 days from the beginning of your symptoms.

## 2021-02-27 MED ORDER — NIRMATRELVIR & RITONAVIR 10 X 150 MG & 10 X 100MG PO TBPK: 2.0000 | ORAL_TABLET | Freq: Two times a day (BID) | ORAL | 0 refills | Status: AC

## 2021-02-28 ENCOUNTER — Other Ambulatory Visit: Payer: Self-pay | Admitting: Internal Medicine

## 2021-02-28 DIAGNOSIS — K8689 Other specified diseases of pancreas: Secondary | ICD-10-CM

## 2021-02-28 DIAGNOSIS — E089 Diabetes mellitus due to underlying condition without complications: Secondary | ICD-10-CM

## 2021-03-03 ENCOUNTER — Other Ambulatory Visit: Payer: Self-pay

## 2021-03-03 ENCOUNTER — Encounter (HOSPITAL_COMMUNITY): Payer: Self-pay | Admitting: Emergency Medicine

## 2021-03-03 ENCOUNTER — Emergency Department (HOSPITAL_COMMUNITY)
Admission: EM | Admit: 2021-03-03 | Discharge: 2021-03-03 | Disposition: A | Discharge status: Home / Self Care | Payer: BLUE CROSS/BLUE SHIELD | Attending: Emergency Medicine | Admitting: Emergency Medicine

## 2021-03-03 DIAGNOSIS — Z5321 Procedure and treatment not carried out due to patient leaving prior to being seen by health care provider: Secondary | ICD-10-CM | POA: Diagnosis not present

## 2021-03-03 DIAGNOSIS — Z1152 Encounter for screening for COVID-19: Secondary | ICD-10-CM | POA: Diagnosis present

## 2021-03-03 DIAGNOSIS — U071 COVID-19: Secondary | ICD-10-CM | POA: Insufficient documentation

## 2021-03-03 DIAGNOSIS — Z20822 Contact with and (suspected) exposure to covid-19: Secondary | ICD-10-CM

## 2021-03-03 LAB — RESP PANEL BY RT-PCR (FLU A&B, COVID) ARPGX2
Influenza A by PCR: NEGATIVE
Influenza B by PCR: NEGATIVE
SARS Coronavirus 2 by RT PCR: POSITIVE — AB

## 2021-03-03 NOTE — ED Notes (Signed)
Pt left the dept without d/c paperwork. Provider notified and aware.

## 2021-03-03 NOTE — ED Triage Notes (Signed)
Per pt, states he tested positive for covid on the 28th-wants to be tested today so he can return to work

## 2021-03-05 NOTE — Telephone Encounter (Signed)
Patient returned my phone call.  States he is still a patient of the clinic and does not know why our name was removed from his chart.  Dr. Karilyn Cota was re-entered as PCP and appointment scheduled with her for next Tuesday 03/09/2021 at 3:45 pm.  Also wanted me to know that when he checks his sugars it does not go any higher than 113. I asked to please bring his recording of his sugars to his appointment next week. Forwarding to blue team.

## 2021-03-05 NOTE — Telephone Encounter (Signed)
Attempted to contact patient to schedule an appointment.  No answer left detailed message asking to please give our office a call back to schedule an appointment.  Also will send a letter asking the same.

## 2021-03-10 ENCOUNTER — Encounter: Payer: Self-pay | Admitting: Internal Medicine

## 2021-03-10 ENCOUNTER — Other Ambulatory Visit: Payer: Self-pay | Admitting: Internal Medicine

## 2021-03-10 ENCOUNTER — Encounter: Payer: BLUE CROSS/BLUE SHIELD | Admitting: Internal Medicine

## 2021-03-13 ENCOUNTER — Encounter: Payer: BLUE CROSS/BLUE SHIELD | Admitting: Internal Medicine

## 2021-03-13 ENCOUNTER — Telehealth: Payer: Self-pay | Admitting: Internal Medicine

## 2021-03-13 NOTE — Telephone Encounter (Signed)
Patient reports that he has been having elevated blood pressure readings for the past few weeks.  States systolic ranges in the 180s.  Yesterday he states he took 2 doses of amlodipine 10 mg and blood pressure improved to 130s systolic.  He denies any chest pain, shortness of breath, headaches or swelling.  Discussed we will need to follow-up with him in clinic first thing Monday to check his blood pressure and he may likely need additional antihypertensive medications.  Discusse if he has any chest pain, shortness of breath or intractable headache to report to the nearest ED.

## 2021-03-14 ENCOUNTER — Emergency Department (HOSPITAL_COMMUNITY)
Admission: EM | Admit: 2021-03-14 | Discharge: 2021-03-14 | Disposition: A | Discharge status: Home / Self Care | Payer: BLUE CROSS/BLUE SHIELD | Attending: Emergency Medicine | Admitting: Emergency Medicine

## 2021-03-14 ENCOUNTER — Other Ambulatory Visit: Payer: Self-pay

## 2021-03-14 ENCOUNTER — Encounter (HOSPITAL_COMMUNITY): Payer: Self-pay

## 2021-03-14 DIAGNOSIS — E119 Type 2 diabetes mellitus without complications: Secondary | ICD-10-CM | POA: Insufficient documentation

## 2021-03-14 DIAGNOSIS — Z79899 Other long term (current) drug therapy: Secondary | ICD-10-CM | POA: Diagnosis not present

## 2021-03-14 DIAGNOSIS — R030 Elevated blood-pressure reading, without diagnosis of hypertension: Secondary | ICD-10-CM | POA: Diagnosis present

## 2021-03-14 DIAGNOSIS — I10 Essential (primary) hypertension: Secondary | ICD-10-CM | POA: Insufficient documentation

## 2021-03-14 DIAGNOSIS — Z794 Long term (current) use of insulin: Secondary | ICD-10-CM | POA: Diagnosis not present

## 2021-03-14 DIAGNOSIS — F17210 Nicotine dependence, cigarettes, uncomplicated: Secondary | ICD-10-CM | POA: Diagnosis not present

## 2021-03-14 LAB — BASIC METABOLIC PANEL
Anion gap: 9 (ref 5–15)
BUN: 13 mg/dL (ref 6–20)
CO2: 23 mmol/L (ref 22–32)
Calcium: 9.7 mg/dL (ref 8.9–10.3)
Chloride: 110 mmol/L (ref 98–111)
Creatinine, Ser: 1.24 mg/dL (ref 0.61–1.24)
GFR, Estimated: >60 mL/min (ref >60)
Glucose, Bld: 101 mg/dL — ABNORMAL HIGH (ref 70–99)
Potassium: 3.9 mmol/L (ref 3.5–5.1)
Sodium: 142 mmol/L (ref 135–145)

## 2021-03-14 LAB — HEMOGLOBIN A1C
Hgb A1c MFr Bld: 5.5 % (ref 4.8–5.6)
Mean Plasma Glucose: 111.15 mg/dL

## 2021-03-14 MED ORDER — HYDROCHLOROTHIAZIDE 25 MG PO TABS: 25.0000 mg | ORAL_TABLET | Freq: Every day | ORAL | 0 refills | Status: DC

## 2021-03-14 NOTE — ED Provider Notes (Signed)
[facility] DEPT Provider Note   CSN: [id number] Arrival date & time: 03/14/21  6767     History Chief Complaint  Patient presents with   Hypertension    Ricky Boyle is a 60 y.o. male.  Ricky Boyle presented for evaluation of elevated blood pressure.  He tried to donate plasma yesterday, and his systolic blood pressure was greater than 200.  He does have an appointment with his primary care provider on Monday, 2 days from today.  He states that he has been taking amlodipine.  He does currently use tobacco but denies cocaine use.  He states that he has been on lisinopril before but could not tolerate it due to ankle swelling.  His only symptom is "feeling like I have a fever but I do not."  This is his indicator that his blood pressure is elevated.  He also states that his eyes are red, but he denies any blurry vision, drainage from the eyes, or pain.  The history is provided by the patient.  Hypertension This is a chronic problem. Episode onset: worse x 1 month. The problem has been gradually worsening. Pertinent negatives include no chest pain, no abdominal pain, no headaches and no shortness of breath. Nothing aggravates the symptoms. Nothing relieves the symptoms. Treatments tried: amlodipine 10 mg. The treatment provided mild relief.      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 No pertinent surgical history.     Family History  Problem Relation Age of Onset   Hypertension Mother    Hypertension Sister    Cancer Neg Hx    Heart attack Neg Hx     Social History   Tobacco Use   Smoking status: Every Day    Packs/day: 0.50    Years: 25.00    Pack years: 12.50    Types: Cigarettes   Smokeless tobacco: Never  Substance Use Topics   Alcohol use: No   Drug use: No    Home Medications Prior to Admission medications   Medication Sig Start Date End Date Taking? Authorizing Provider  Accu-Chek Softclix Lancets lancets USE AS DIRECTED UP TO 4 TIMES DAILY 05/26/20   Marianna Payment, MD  amLODipine (NORVASC) 10 MG tablet TAKE 1 TABLET(10 MG) BY MOUTH DAILY 07/02/20   Cato Mulligan, MD  blood glucose meter kit and supplies KIT The patient is insulin requiring, ICD 10 code E11.9. The patient tests 3 times per day. 10/07/20   Seawell, Jaimie A, DO  CONTOUR NEXT TEST test strip USE TO CHECK BLOOD GLUCOSE 3 TIMES A DAY 11/24/20   Iona Beard, MD  insulin aspart (NOVOLOG FLEXPEN) 100 UNIT/ML FlexPen Inject 10 Units into the skin 3 (three) times daily with meals. 06/24/20   Asencion Noble, MD  Insulin Pen Needle 29G X 12MM MISC 1 Device by Does not apply route in the morning, at noon, in the evening, and at bedtime. 10/02/20   Seawell, Jaimie A, DO  LANTUS SOLOSTAR 100 UNIT/ML Solostar Pen ADMINISTER 26 UNITS UNDER THE SKIN AT BEDTIME 03/05/21   Gaylan Gerold, DO    Allergies    Bee venom, Shrimp [shellfish allergy], and Penicillins  Review of Systems   Review of Systems  Constitutional:  Negative for chills and fever.  HENT:  Negative for ear pain and sore throat.   Eyes:  Positive for redness. Negative for pain and visual disturbance.  Respiratory:  Negative for cough and shortness of breath.   Cardiovascular:  Negative for  chest pain and palpitations.  Gastrointestinal:  Negative for abdominal pain and vomiting.  Genitourinary:  Negative for dysuria and hematuria.  Musculoskeletal:  Negative for arthralgias and back pain.  Skin:  Negative for color change and rash.  Neurological:  Negative for seizures, syncope and headaches.  All other systems reviewed and are negative.  Physical Exam Updated Vital Signs BP (!) 146/108 (BP Location: Left Arm)   Pulse 90   Temp 98.4 F (36.9 C) (Oral)   Resp 16   SpO2 96%   Physical Exam Vitals and nursing note reviewed.  Constitutional:      Appearance: Normal appearance.  HENT:     Head: Normocephalic and atraumatic.  Cardiovascular:     Rate and Rhythm: Normal rate and regular rhythm.     Heart sounds: Normal heart sounds.  Pulmonary:     Effort: Pulmonary effort is normal.     Breath sounds: Normal breath sounds.  Musculoskeletal:     Cervical back: Normal range of motion.     Right lower leg: No edema.     Left lower leg: No edema.  Skin:    General: Skin is warm and dry.  Neurological:     General: No focal deficit present.     Mental Status: He is alert and oriented to person, place, and time.  Psychiatric:        Mood and Affect: Mood normal.        Behavior: Behavior normal.    ED Results / Procedures / Treatments   Labs (all labs ordered are listed, but only abnormal results are displayed) Labs Reviewed  BASIC METABOLIC PANEL - Abnormal; Notable for the following components:      Result Value   Glucose, Bld 101 (*)    All other components within normal limits  HEMOGLOBIN A1C    EKG None No acute ischemia Radiology No results found.  Procedures Procedures   Medications Ordered in ED Medications - No data to display  ED Course  I have reviewed the triage vital signs and the nursing notes.  Pertinent labs & imaging results that were available during my care of the patient were reviewed by me and considered in my medical  decision making (see chart for details).    MDM Rules/Calculators/A&P                           Ricky Boyle presents with 1 month of uncontrolled blood pressure.  No symptoms of hypertensive emergency.  Blood pressure here is slightly elevated, especially his diastolic value.  No evidence of acute kidney injury.  Hemoglobin A1c drawn for aid in follow-up at his primary care physician's office in 2 days.  This was mainly because the patient  have reported that he is not taking insulin.  I will start him on hydrochlorothiazide.  He should use this in addition to his amlodipine, and as planned, he should follow-up with his primary care provider. Final Clinical Impression(s) / ED Diagnoses Final diagnoses:  Primary hypertension    Rx / DC Orders ED Discharge Orders          Ordered    hydrochlorothiazide (HYDRODIURIL) 25 MG tablet  Daily        03/14/21 0939             Arnaldo Natal, MD 03/14/21 [id number]

## 2021-03-14 NOTE — ED Triage Notes (Signed)
Pt reports high BP over the past two days. Pt denies any symptoms. Pt states that he take his BP medication everyday as prescribed.

## 2021-03-16 ENCOUNTER — Other Ambulatory Visit: Payer: Self-pay

## 2021-03-16 ENCOUNTER — Ambulatory Visit (INDEPENDENT_AMBULATORY_CARE_PROVIDER_SITE_OTHER): Payer: BLUE CROSS/BLUE SHIELD | Admitting: Internal Medicine

## 2021-03-16 ENCOUNTER — Encounter: Payer: Self-pay | Admitting: Internal Medicine

## 2021-03-16 VITALS — BP 182/116 | HR 79 | Temp 98.0°F | Ht 66.0 in | Wt 215.2 lb

## 2021-03-16 DIAGNOSIS — I10 Essential (primary) hypertension: Secondary | ICD-10-CM

## 2021-03-16 DIAGNOSIS — G4733 Obstructive sleep apnea (adult) (pediatric): Secondary | ICD-10-CM | POA: Diagnosis not present

## 2021-03-16 DIAGNOSIS — E089 Diabetes mellitus due to underlying condition without complications: Secondary | ICD-10-CM | POA: Diagnosis not present

## 2021-03-16 DIAGNOSIS — K8689 Other specified diseases of pancreas: Secondary | ICD-10-CM

## 2021-03-16 MED ORDER — AMLODIPINE BESYLATE 10 MG PO TABS: ORAL_TABLET | ORAL | 1 refills | Status: DC

## 2021-03-16 MED ORDER — OLMESARTAN MEDOXOMIL 20 MG PO TABS: 20.0000 mg | ORAL_TABLET | Freq: Every day | ORAL | 2 refills | Status: DC

## 2021-03-16 NOTE — Assessment & Plan Note (Signed)
Vitals:   03/16/21 1510 03/16/21 1548  BP: (!) 151/104 (!) 182/116   Patient was seen in the emergency department over the weekend due to elevated blood pressure readings.  He went to donate plasma on Friday and systolic blood pressure was 214.  The emergency department is blood pressures that improved to the 140s in the systolics diastolics in the low 100s.  He was started on hydrochlorothiazide 25 mg.  He denies any symptoms of chest pain, headaches shortness of breath, leg swelling or changes in his vision.  He states he has been completely asymptomatic even with these elevated blood pressure readings.  He states hydrochlorothiazide has historically given him a headache and he feels that he is having 1 again today due to this medication.  Discussed switching this medication to an ARB.  Patient also states that he took lisinopril in the past and remembers having leg swelling with this medication.  Patient is currently asymptomatic but repeat blood pressure worsened, 182/116.  Plan: -Start olmesartan 20 mg daily -Continue amlodipine 10 mg daily -Follow-up plasma metanephrines -Keep a blood pressure log over the next 1 to 2 weeks -Patient to follow-up later this week if blood pressure remains elevated increase olmesartan to 40 mg -daily and follow-up next week

## 2021-03-16 NOTE — Progress Notes (Signed)
   CC: Hypertension  HPI:  Mr.Aquil Boening is a 60 y.o. past medical history listed below presenting for evaluation of his hypertension. For details of today's visit and the status of his chronic medical issues please refer to the assessment and plan.   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 ([facility])    Hypertension    Opioid abuse ([facility])    Pancreatic pseudocyst    Review of Systems: Negative except as per assessment and plan  Physical Exam:  Vitals:   03/16/21 1510 03/16/21 1548  BP: (!) 151/104 (!) 182/116  Pulse: 88 79  Temp: 98 F (36.7 C)   TempSrc: Oral   SpO2: 99%   Weight: 215 lb 3.2 oz (97.6 kg)   Height: 5\' 6"  (1.676 m)    Physical Exam General: alert, appears stated age, in no acute distress HEENT: Normocephalic, atraumatic, EOM intact, conjunctiva normal CV: Regular rate and rhythm, no murmurs rubs or gallops Pulm: Clear to auscultation bilaterally, normal work of breathing Abdomen: Soft, nondistended, bowel sounds present, no tenderness to palpation MSK: No lower extremity edema Skin: Warm and dry Neuro: Alert and oriented x3   Assessment & Plan:   See Encounters Tab for problem based charting.  Patient discussed with Dr. 

## 2021-03-16 NOTE — Assessment & Plan Note (Addendum)
Patient reports he has a history of obstructive sleep apnea but has never used a CPAP machine.  States that he was scared of these machines due to hearing about people getting infections from them.  Discussed with patient if he continues to keep his tubes clean this would not be an issue.  Patient is agreeable to starting CPAP.   Plan: Per chart review I do not see any sleep studies. Will refer to [facility] pulmonology for sleep study

## 2021-03-16 NOTE — Assessment & Plan Note (Signed)
Hemoglobin A1c 5.5.  Patient stopped taking his insulin about 2 months ago due to low blood glucose readings.  Discussed that it is okay to discontinue his insulin use.  Will check hemoglobin A1c in about 6 months.

## 2021-03-16 NOTE — Patient Instructions (Addendum)
I have sent in a prescription for olmesartan 20 mg daily.  Please take this in addition to amlodipine 10 mg.  Come back to see me in 2 weeks.  Please remember to bring your blood pressure cuff.

## 2021-03-18 NOTE — Progress Notes (Signed)
Internal Medicine Clinic Attending ° °Case discussed with Dr. Rehman  At the time of the visit.  We reviewed the resident’s history and exam and pertinent patient test results.  I agree with the assessment, diagnosis, and plan of care documented in the resident’s note.  ° °

## 2021-03-18 NOTE — Addendum Note (Signed)
Addended by: Jaci Standard on: 03/18/2021 11:15 AM   Modules accepted: Level of Service

## 2021-03-19 ENCOUNTER — Encounter: Payer: BLUE CROSS/BLUE SHIELD | Admitting: Internal Medicine

## 2021-03-22 LAB — METANEPHRINES, PLASMA
Metanephrine, Free: 50.9 pg/mL (ref 0.0–88.0)
Normetanephrine, Free: 130 pg/mL (ref 0.0–244.0)

## 2021-03-23 ENCOUNTER — Encounter: Payer: BLUE CROSS/BLUE SHIELD | Admitting: Internal Medicine

## 2021-03-30 ENCOUNTER — Encounter: Payer: Self-pay | Admitting: Internal Medicine

## 2021-03-30 ENCOUNTER — Other Ambulatory Visit: Payer: Self-pay

## 2021-03-30 ENCOUNTER — Ambulatory Visit (INDEPENDENT_AMBULATORY_CARE_PROVIDER_SITE_OTHER): Payer: BLUE CROSS/BLUE SHIELD | Admitting: Internal Medicine

## 2021-03-30 VITALS — BP 145/93 | HR 88 | Temp 98.1°F | Ht 67.0 in | Wt 213.2 lb

## 2021-03-30 DIAGNOSIS — Z0000 Encounter for general adult medical examination without abnormal findings: Secondary | ICD-10-CM

## 2021-03-30 DIAGNOSIS — F17200 Nicotine dependence, unspecified, uncomplicated: Secondary | ICD-10-CM | POA: Diagnosis not present

## 2021-03-30 DIAGNOSIS — Z1211 Encounter for screening for malignant neoplasm of colon: Secondary | ICD-10-CM

## 2021-03-30 DIAGNOSIS — I10 Essential (primary) hypertension: Secondary | ICD-10-CM | POA: Diagnosis not present

## 2021-03-30 DIAGNOSIS — G4733 Obstructive sleep apnea (adult) (pediatric): Secondary | ICD-10-CM

## 2021-03-30 MED ORDER — AMLODIPINE-OLMESARTAN 10-40 MG PO TABS: 1.0000 | ORAL_TABLET | Freq: Every day | ORAL | 1 refills | Status: DC

## 2021-03-30 MED ORDER — VARENICLINE TARTRATE 0.5 MG PO TABS: 0.5000 mg | ORAL_TABLET | Freq: Two times a day (BID) | ORAL | 0 refills | Status: DC

## 2021-03-30 MED ORDER — VARENICLINE TARTRATE 0.5 MG PO TABS: 0.5000 mg | ORAL_TABLET | Freq: Two times a day (BID) | ORAL | 1 refills | Status: DC

## 2021-03-30 MED ORDER — OLMESARTAN-AMLODIPINE-HCTZ 40-10-25 MG PO TABS: 1.0000 | ORAL_TABLET | Freq: Every day | ORAL | 1 refills | Status: DC

## 2021-03-30 NOTE — Assessment & Plan Note (Addendum)
Vitals:   03/30/21 1326  BP: (!) 145/93  Pulse: 88  Temp: 98.1 F (36.7 C)  SpO2: 97%   Blood pressure slightly above goal today but much improved since last visit.  Patient is currently on olmesartan 20 mg and amlodipine 10.  He is tolerating these medications well.  He states his blood pressures have remained elevated anytime he checks an outpatient such as when he goes to donate blood.  However he denies any headaches, shortness of breath, chest pain or leg swelling.  States that he would never know that his blood pressure is elevated unless it was checked.  He is working on getting a blood pressure cuff through his insurance.  Plan: -Increase olmesartan to 40 mg daily -Continue amlodipine 10 mg daily - Follow-up BMP and follow-up in 4 weeks for blood pressure check

## 2021-03-30 NOTE — Assessment & Plan Note (Signed)
Patient is interested in tobacco cessation.  Smokes about half a pack a day for the past 25 years.  States he has tried nicotine patches and gum which has not helped.  He is in the process of enrolling in a smoking cessation program through his insurance.  He is interested in starting Chantix today.  Plan: Chantix prescription sent, 0.5 mg once daily for 3 days and then uptitrate to 0.5 mg twice daily

## 2021-03-30 NOTE — Assessment & Plan Note (Addendum)
Referral to ophthalmology placed today.  Patient declines colonoscopy but is okay with FIT testing.  Recommend pneumonia, Shingrix and COVID vaccinations at next visit.

## 2021-03-30 NOTE — Assessment & Plan Note (Signed)
Patient states [facility] left him a message on my chart regarding scheduling an appointment for his sleep study.  Patient will follow-up and schedule

## 2021-03-30 NOTE — Patient Instructions (Signed)
I have increased 1 your medications called olmesartan from 20 mg to 40 mg.  I have also sent in a combination pill of your 3 pressure medications.  This medication contains olmesartan 40 mg, hydrochlorothiazide 25 mg and amlodipine 10 mg.  You can take 1 tablet daily.  I will call you if any of your blood work is abnormal.  Follow-up in about 4 weeks.

## 2021-03-30 NOTE — Progress Notes (Signed)
   CC: Hypertension  HPI:  [name] is a 60 y.o. with a past medical history listed below presenting for follow-up evaluation of his hypertension. For details of today's visit and the status of his chronic medical issues please refer to the assessment and plan.   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)    Opioid abuse (HCC)    Pancreatic pseudocyst    Review of Systems: Negative except as per assessment and plan  Physical Exam:  Vitals:   03/30/21 1326  BP: (!) 145/93  Pulse: 88  Temp: 98.1 F (36.7 C)  TempSrc: Oral  SpO2: 97%  Weight: 213 lb 3.2 oz (96.7 kg)  Height: 5\' 7"  (1.702 m)   Physical Exam General: alert, appears stated age, in no acute distress HEENT: Normocephalic, atraumatic, EOM intact, conjunctiva normal CV: Regular rate and rhythm, no murmurs rubs or gallops Pulm: Clear to auscultation bilaterally, normal work of breathing Abdomen: Soft, nondistended, bowel sounds present, no tenderness to palpation MSK: No lower extremity edema Skin: Warm and dry Neuro: Alert and oriented x3   Assessment & Plan:   See Encounters Tab for problem based charting.  Patient discussed with Dr. 

## 2021-03-31 LAB — BMP8+ANION GAP
Anion Gap: 17 mmol/L (ref 10.0–18.0)
BUN/Creatinine Ratio: 12 (ref 9–20)
BUN: 15 mg/dL (ref 6–24)
CO2: 22 mmol/L (ref 20–29)
Calcium: 9.8 mg/dL (ref 8.7–10.2)
Chloride: 104 mmol/L (ref 96–106)
Creatinine, Ser: 1.3 mg/dL — ABNORMAL HIGH (ref 0.76–1.27)
Glucose: 173 mg/dL — ABNORMAL HIGH (ref 65–99)
Potassium: 4 mmol/L (ref 3.5–5.2)
Sodium: 143 mmol/L (ref 134–144)
eGFR: 63 mL/min/{1.73_m2} (ref >59)

## 2021-04-16 NOTE — Progress Notes (Signed)
Internal Medicine Clinic Attending ° °Case discussed with Dr. Rehman  At the time of the visit.  We reviewed the resident’s history and exam and pertinent patient test results.  I agree with the assessment, diagnosis, and plan of care documented in the resident’s note.  ° °

## 2021-04-27 ENCOUNTER — Encounter: Payer: BLUE CROSS/BLUE SHIELD | Admitting: Internal Medicine

## 2021-05-26 ENCOUNTER — Telehealth: Payer: Self-pay

## 2021-05-26 NOTE — Telephone Encounter (Signed)
I called pt back. He stated his BS's have been running high and he has been testing frequently, now he's out, requesting a refill. I asked about his BS's - he stated now it's 187 but it has been as high as 470. He stated he was taken off the insulin b/c his A1C was 5.5. but he started back taking Novolog today when his BS's were high. Stated he was having blurred vision and frequent urination.he has plenty of insulin. He has an appt tomorrow 10/26 with Dr Mcarthur Rossetti @ [phone] Am, Stated he has a contour meter  - needs tests strips rx sent to [facility] on [address]. Thanks

## 2021-05-26 NOTE — Telephone Encounter (Signed)
Pt is requesting a call back he is requesting test strips for his meter .. pt called earlier said his blood sugar was over 400 this morning and needed an appt was given an appt tomorrow 915

## 2021-05-27 ENCOUNTER — Encounter: Payer: BLUE CROSS/BLUE SHIELD | Admitting: Internal Medicine

## 2021-06-05 ENCOUNTER — Encounter: Payer: BLUE CROSS/BLUE SHIELD | Admitting: Student

## 2021-06-21 ENCOUNTER — Emergency Department (HOSPITAL_COMMUNITY)
Admission: EM | Admit: 2021-06-21 | Discharge: 2021-06-21 | Disposition: A | Discharge status: Home / Self Care | Payer: BLUE CROSS/BLUE SHIELD | Attending: Emergency Medicine | Admitting: Emergency Medicine

## 2021-06-21 DIAGNOSIS — Z79899 Other long term (current) drug therapy: Secondary | ICD-10-CM | POA: Insufficient documentation

## 2021-06-21 DIAGNOSIS — E1165 Type 2 diabetes mellitus with hyperglycemia: Secondary | ICD-10-CM | POA: Diagnosis not present

## 2021-06-21 DIAGNOSIS — R739 Hyperglycemia, unspecified: Secondary | ICD-10-CM | POA: Diagnosis present

## 2021-06-21 DIAGNOSIS — R109 Unspecified abdominal pain: Secondary | ICD-10-CM | POA: Diagnosis not present

## 2021-06-21 DIAGNOSIS — I10 Essential (primary) hypertension: Secondary | ICD-10-CM | POA: Diagnosis not present

## 2021-06-21 DIAGNOSIS — F17210 Nicotine dependence, cigarettes, uncomplicated: Secondary | ICD-10-CM | POA: Insufficient documentation

## 2021-06-21 DIAGNOSIS — R202 Paresthesia of skin: Secondary | ICD-10-CM | POA: Insufficient documentation

## 2021-06-21 LAB — CBC WITH DIFFERENTIAL/PLATELET
Abs Immature Granulocytes: 0.02 10*3/uL (ref 0.00–0.07)
Basophils Absolute: 0 10*3/uL (ref 0.0–0.1)
Basophils Relative: 1 %
Eosinophils Absolute: 0.1 10*3/uL (ref 0.0–0.5)
Eosinophils Relative: 2 %
HCT: 45.2 % (ref 39.0–52.0)
Hemoglobin: 15.7 g/dL (ref 13.0–17.0)
Immature Granulocytes: 0 %
Lymphocytes Relative: 44 %
Lymphs Abs: 3.4 10*3/uL (ref 0.7–4.0)
MCH: 33.1 pg (ref 26.0–34.0)
MCHC: 34.7 g/dL (ref 30.0–36.0)
MCV: 95.2 fL (ref 80.0–100.0)
Monocytes Absolute: 0.5 10*3/uL (ref 0.1–1.0)
Monocytes Relative: 6 %
Neutro Abs: 3.7 10*3/uL (ref 1.7–7.7)
Neutrophils Relative %: 47 %
Platelets: 213 10*3/uL (ref 150–400)
RBC: 4.75 MIL/uL (ref 4.22–5.81)
RDW: 12.5 % (ref 11.5–15.5)
WBC: 7.8 10*3/uL (ref 4.0–10.5)
nRBC: 0 % (ref 0.0–0.2)

## 2021-06-21 LAB — COMPREHENSIVE METABOLIC PANEL
ALT: 24 U/L (ref 0–44)
AST: 22 U/L (ref 15–41)
Albumin: 3.9 g/dL (ref 3.5–5.0)
Alkaline Phosphatase: 89 U/L (ref 38–126)
Anion gap: 9 (ref 5–15)
BUN: 28 mg/dL — ABNORMAL HIGH (ref 6–20)
CO2: 23 mmol/L (ref 22–32)
Calcium: 9.2 mg/dL (ref 8.9–10.3)
Chloride: 102 mmol/L (ref 98–111)
Creatinine, Ser: 1.01 mg/dL (ref 0.61–1.24)
GFR, Estimated: >60 mL/min (ref >60)
Glucose, Bld: 361 mg/dL — ABNORMAL HIGH (ref 70–99)
Potassium: 4.4 mmol/L (ref 3.5–5.1)
Sodium: 134 mmol/L — ABNORMAL LOW (ref 135–145)
Total Bilirubin: 1.5 mg/dL — ABNORMAL HIGH (ref 0.3–1.2)
Total Protein: 7.2 g/dL (ref 6.5–8.1)

## 2021-06-21 LAB — HEMOGLOBIN A1C
Hgb A1c MFr Bld: 11.8 % — ABNORMAL HIGH (ref 4.8–5.6)
Mean Plasma Glucose: 291.96 mg/dL

## 2021-06-21 LAB — BETA-HYDROXYBUTYRIC ACID: Beta-Hydroxybutyric Acid: 0.86 mmol/L — ABNORMAL HIGH (ref 0.05–0.27)

## 2021-06-21 LAB — URINALYSIS, ROUTINE W REFLEX MICROSCOPIC
Bacteria, UA: NONE SEEN
Bilirubin Urine: NEGATIVE
Glucose, UA: >=500 mg/dL — AB
Hgb urine dipstick: NEGATIVE
Ketones, ur: 20 mg/dL — AB
Leukocytes,Ua: NEGATIVE
Nitrite: NEGATIVE
Protein, ur: NEGATIVE mg/dL
Specific Gravity, Urine: 1.026 (ref 1.005–1.030)
pH: 5 (ref 5.0–8.0)

## 2021-06-21 LAB — BLOOD GAS, VENOUS
Acid-base deficit: 0.5 mmol/L (ref 0.0–2.0)
Bicarbonate: 24.7 mmol/L (ref 20.0–28.0)
O2 Saturation: 82.4 %
Patient temperature: 98.6
pCO2, Ven: 44.7 mmHg (ref 44.0–60.0)
pH, Ven: 7.362 (ref 7.250–7.430)
pO2, Ven: 47.7 mmHg — ABNORMAL HIGH (ref 32.0–45.0)

## 2021-06-21 LAB — CBG MONITORING, ED
Glucose-Capillary: 329 mg/dL — ABNORMAL HIGH (ref 70–99)
Glucose-Capillary: 271 mg/dL — ABNORMAL HIGH (ref 70–99)

## 2021-06-21 MED ORDER — SODIUM CHLORIDE 0.9 % IV BOLUS
1000.0000 mL | Freq: Once | INTRAVENOUS | Status: AC
  Administered 2021-06-21: 1000 mL via INTRAVENOUS

## 2021-06-21 NOTE — Discharge Instructions (Addendum)
Your blood work today was reassuring and showed that you are not in DKA.  Your blood sugar on discharge was 270.  You received IV fluids during your emergency rooms visit.  I recommend you call your internal medicine clinic first thing tomorrow morning to be seen and we started on your insulin regimen.  If you develop nausea, vomiting, abdominal pain, or confusion please return to the emergency room.

## 2021-06-21 NOTE — ED Provider Notes (Signed)
Emergency Medicine Provider Triage Evaluation Note  Ricky Boyle , a 60 y.o. male  was evaluated in triage.  Pt complains of concerns for hyperglycemia.  He repots that he used a friends meter on Friday and his sugar was high.  He used to be on insulin but had taken him self off due to hypoglycemia.  And when he was seen on 03/16/21 he had been off the insulin for two months and his a1c was normal so he was advised to stay off the insulin.   He did change his diet when he was told he wasn't diabetic.   No steroids.  He reports that he feels tingley, dizzy/off.  He feels cramping.  He says this feels like when he was diagnosed with DM.   Review of Systems  Positive: Dizzy, cramping, polyuria, polydipsia, Negative: Syncope.  Fever  Physical Exam  BP (!) 139/93 (BP Location: Left Arm)   Pulse 84   Temp 97.9 F (36.6 C) (Oral)   Resp 17   Ht 5\' 7"  (1.702 m)   SpO2 94%   BMI 33.39 kg/m  Gen:   Awake, no distress   Resp:  Normal effort  MSK:   Moves extremities without difficulty  Other:  Normal speech  Medical Decision Making  Medically screening exam initiated at 1:54 PM.  Appropriate orders placed.  Ricky Boyle was informed that the remainder of the evaluation will be completed by another provider, this initial triage assessment does not replace that evaluation, and the importance of remaining in the ED until their evaluation is complete.     [name], PA-C 06/21/21 1400    06/23/21, MD 06/21/21 [phone]

## 2021-06-21 NOTE — ED Notes (Signed)
Patient expressed his frustration with not receiving insulin. He said he needed to see the doctor above the PA. He said it makes no sense he is going home with it still being high. Dr. [name] at bedside, recommending follow up with internal medicine tomorrow morning to establish a plan to get his medication regimen correct.

## 2021-06-21 NOTE — ED Provider Notes (Signed)
Signout received on this 60 year old male presents who today for evaluation of hyperglycemia secondary to noncompliance with his insulin regimen.  Physical Exam  BP (!) 155/100   Pulse 69   Temp 97.9 F (36.6 C) (Oral)   Resp 20   Ht 5\' 7"  (1.702 m)   SpO2 96%   BMI 33.39 kg/m     ED Course/Procedures     Procedures  MDM  Patient in the emergency room was hydrated with improvement in his blood sugar to 270 upon discharge.  Patient's blood work was not consistent with DKA.  Patient is established with internal medicine and has ability for close follow-up.  Patient appears comfortable and without acute distress on exam.  Patient is appropriate for discharge.  Patient will call internal medicine clinic tomorrow morning to be seen for further management of his diabetes.  Charge planning discussed with Dr. .       Ricky Loots, PA-C 06/21/21 2202    2203, MD 06/21/21 2337

## 2021-06-21 NOTE — ED Provider Notes (Signed)
[facility] Provider Note   CSN: [id number] Arrival date & time: 06/21/21  1314     History Chief Complaint  Patient presents with   Hyperglycemia    Ricky Boyle is a 60 y.o. male with history of diabetes secondary to pancreatic sufficiency who presents to the emergency department for dizziness and hyperglycemia.  Patient states that he was diagnosed with diabetes about a year ago, but several months ago had an episode of hypoglycemia after taking insulin.  He brought this up to his doctor, and they checked an A1c and it was 5.5, so he stopped taking insulin and got rid of all of his diabetic supplies.  He also reports that after he stopped diabetic treatment, he has been living a "reckless lifestyle" and does not want to discuss this further.  He states for the past 4 days he has had dizziness, intermittent abdominal cramping, and intermittent numbness in his bilateral legs and right hand.  He says that he checked his blood sugar at home and it was 498.  He also notes increased thirst and increased urination, no nausea or vomiting, no diarrhea or constipation.  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     Family History  Problem Relation Age of Onset   Hypertension Mother    Hypertension Sister    Cancer Neg Hx    Heart attack Neg Hx     Social History   Tobacco Use   Smoking status: Every Day    Packs/day: 0.50    Years: 25.00    Pack years: 12.50    Types: Cigarettes   Smokeless tobacco: Never  Substance Use Topics   Alcohol use: No   Drug use: No    Home Medications Prior to Admission medications   Medication Sig Start Date End Date Taking? Authorizing Provider  amLODipine-olmesartan (AZOR) 10-40 MG tablet Take 1 tablet by mouth daily. 03/30/21   Rehman, Areeg N, DO  varenicline (CHANTIX) 0.5 MG tablet Take 1 tablet (0.5 mg total) by mouth 2 (two) times daily. Take 0.5 mg once daily for 3 days then increase to 0.5 mg twice daily 03/30/21 06/28/21  Rehman, Areeg N, DO    Allergies    Bee venom, Shrimp [shellfish allergy], and Penicillins  Review of Systems   Review of Systems  Constitutional:  Negative for chills and fever.  Respiratory:  Negative for shortness of breath.   Cardiovascular:  Negative for chest pain.  Gastrointestinal:  Positive for abdominal pain. Negative for constipation, diarrhea, nausea and vomiting.  Genitourinary:  Positive for frequency. Negative for difficulty urinating, dysuria, flank pain and urgency.  Neurological:  Positive for dizziness, numbness and headaches. Negative for tremors, syncope and weakness.  All other systems reviewed and are negative.  Physical Exam Updated Vital Signs BP (!) 158/102 (BP Location: Left Arm)   Pulse 65   Temp 97.9 F (36.6 C) (Oral)   Resp 18   Ht 5\' 7"  (1.702 m)   SpO2 98%   BMI 33.39 kg/m   Physical Exam Vitals and nursing note reviewed.  Constitutional:      Appearance: Normal appearance.  HENT:     Head: Normocephalic and atraumatic.   Eyes:     Conjunctiva/sclera: Conjunctivae normal.  Cardiovascular:     Rate and Rhythm: Normal rate and regular rhythm.  Pulmonary:     Effort: Pulmonary effort is normal. No respiratory distress.     Breath sounds: Normal breath sounds.  Abdominal:     General: There is no distension.     Palpations: Abdomen is soft.     Tenderness: There is no abdominal tenderness.  Skin:    General: Skin is warm and dry.  Neurological:     General: No focal deficit present.     Mental Status: He is alert.    ED Results / Procedures / Treatments   Labs (all labs ordered are listed, but only abnormal results are displayed) Labs Reviewed  COMPREHENSIVE METABOLIC PANEL - Abnormal; Notable for the following components:      Result Value   Sodium 134 (*)    Glucose, Bld 361 (*)    BUN 28 (*)    Total Bilirubin 1.5 (*)    All other components within normal limits  BETA-HYDROXYBUTYRIC ACID - Abnormal; Notable for the following components:   Beta-Hydroxybutyric Acid 0.86 (*)    All other components within normal limits  CBG MONITORING, ED - Abnormal; Notable for the following components:   Glucose-Capillary 329 (*)    All other components within normal limits  CBC WITH DIFFERENTIAL/PLATELET  URINALYSIS, ROUTINE W REFLEX MICROSCOPIC  HEMOGLOBIN A1C  BLOOD GAS, VENOUS    EKG None  Radiology No results found.  Procedures Procedures   Medications Ordered in ED Medications  sodium chloride 0.9 % bolus 1,000 mL (1,000 mLs Intravenous New Bag/Given 06/21/21 1659)    ED Course  I have reviewed the triage vital signs and the nursing notes.  Pertinent labs & imaging results that were available during my care of the patient were reviewed by me and considered in my medical decision making (see chart for details).    MDM Rules/Calculators/A&P                           Patient is 60 y/o male with history of diabetes secondary to pancreatic sufficiency who presents to the emergency  department for dizziness and hyperglycemia x 4 days.   Patient was a previously insulin-dependent diabetic, but stopped taking all insulin and all other diabetes treatment several months ago after he had a normal A1c.  He also states he returned to a normal diet and a "reckless lifestyle" but will not elaborate on this further.  On my exam patient is afebrile, not tachycardic, not hypoxic, no acute distress.  Abdomen is soft, nontender and non-distended.  Glucose on CMP was 361.  Started on IV fluids.  Venous blood gases pending.  5:53pm Patient discussed and care transferred to oncoming provider at shift change 46,  see his note for disposition. If we are able to adequately hydrate patient and there is no evidence of ketonemia/ketonuria I anticipate we can discharge to home with follow up for long term diabetes management. He will require further management if found to be in DKA.   I discussed this case with my attending physician Dr. [name] who cosigned this note including patient's presenting symptoms, physical exam, and planned diagnostics and interventions. Attending physician stated agreement with plan or made changes to plan which were implemented.    Final Clinical Impression(s) / ED Diagnoses Final diagnoses:  None    Rx / DC Orders ED Discharge Orders     None        Jeanella Flattery 06/21/21 1756    Gerhard Munch, MD 06/21/21 2336

## 2021-06-21 NOTE — ED Triage Notes (Signed)
Patient reports a few days ago he began feeling dizzy, checked blood sugar it was 498. Says he use to take insulin but got rid of all his diabetic supplies because he was told his a1c was 5.5. last night he felt weak and legs tingling. Denies pain in triage.

## 2021-06-22 ENCOUNTER — Telehealth: Payer: Self-pay

## 2021-06-22 NOTE — Telephone Encounter (Signed)
RTC, VM is full and RN unable to leave a message. SChaplin, RN,BSN

## 2021-06-22 NOTE — Telephone Encounter (Signed)
Requesting to speak with a nurse about getting medications refill. Offered an appt to pt, pt refused to come in. Please call pt back.

## 2021-06-23 NOTE — Telephone Encounter (Signed)
Patient call for refills of medication.  Has missed several appointments.  Was seen in ER  for elevated blood sugars and was not given any meds. HA1C was 11.8 on 06/21/2021.   Patient scheduled for appointment on 06/24/2021.  Was asked to bring all current meds.

## 2021-06-24 ENCOUNTER — Ambulatory Visit: Payer: BLUE CROSS/BLUE SHIELD | Admitting: Dietician

## 2021-06-24 ENCOUNTER — Other Ambulatory Visit: Payer: Self-pay

## 2021-06-24 ENCOUNTER — Ambulatory Visit (INDEPENDENT_AMBULATORY_CARE_PROVIDER_SITE_OTHER): Payer: BLUE CROSS/BLUE SHIELD | Admitting: Internal Medicine

## 2021-06-24 ENCOUNTER — Encounter: Payer: Self-pay | Admitting: Dietician

## 2021-06-24 ENCOUNTER — Encounter: Payer: Self-pay | Admitting: Internal Medicine

## 2021-06-24 VITALS — BP 130/84 | HR 94 | Temp 98.2°F | Ht 67.0 in | Wt 205.5 lb

## 2021-06-24 DIAGNOSIS — K869 Disease of pancreas, unspecified: Secondary | ICD-10-CM

## 2021-06-24 DIAGNOSIS — E1169 Type 2 diabetes mellitus with other specified complication: Secondary | ICD-10-CM

## 2021-06-24 DIAGNOSIS — I10 Essential (primary) hypertension: Secondary | ICD-10-CM | POA: Diagnosis not present

## 2021-06-24 MED ORDER — INSULIN DEGLUDEC 100 UNIT/ML ~~LOC~~ SOPN: 20.0000 [IU] | PEN_INJECTOR | Freq: Every day | SUBCUTANEOUS | 1 refills | Status: DC

## 2021-06-24 MED ORDER — CONTOUR TEST VI STRP: ORAL_STRIP | 12 refills | Status: DC

## 2021-06-24 MED ORDER — AMLODIPINE-OLMESARTAN 10-40 MG PO TABS
1.0000 | ORAL_TABLET | Freq: Every day | ORAL | 1 refills | Status: DC
Start: 2021-06-24 — End: 2022-01-27

## 2021-06-24 NOTE — Assessment & Plan Note (Addendum)
Lab Results  Component Value Date   HGBA1C 11.8 (H) 06/21/2021   Mr. Standage states that several months ago, he discontinued his insulin when he was noticing hypoglycemic events as low as 41.  He states that his blood sugars never exceeded 120.  He feels that his A1c rise at this time is secondary to increased soda intake with job changes.  He endorses polyuria and polydipsia.  No acute complaints at this time.  He is interested in a CGM.  Assessment/Plan:  A1c markedly increased from 5.5% to 11.8% over the last 4 months.  We will restart insulin with long-acting only and titrate slowly based off CGM readings.  It appears his insurance will cover Evaristo Bury rather than Lantus or Semglee.  - Start Evaristo Bury 20 units daily at bedtime - CGM placed today - Return in 1 week for CGM reading - A1c due February 2023

## 2021-06-24 NOTE — Assessment & Plan Note (Signed)
BP: 130/84   [name] denies any difficulty with his medications. He is requesting a refill today.   Assessment/Plan: Blood pressure well controlled on current regimen. No adjustments indicated.   - Refilled Amlodipine-Olmesartan 10-40 mg daily

## 2021-06-24 NOTE — Patient Instructions (Signed)
Please record the time, amount and what food drinks and activities you have while wearing the continuous glucose monitor (CGM).  Bring the folder with you to follow up appointments. If your monitor falls off, please place it in the bag provided in your folder and bring it back with you to your next appointment.   Keep checking your blood sugars are you usually do and bring your meter to all appointments.  Do not have a CT or an MRI while wearing the CGM.   1 week visit has been set up with me and a doctor for the first of two CGM downloads.   You will also return in 2 weeks to have your second download and the CGM removed.  Ricky Boyle [phone]

## 2021-06-24 NOTE — Progress Notes (Signed)
Documentation for Freestyle Libre Pro Continuous glucose monitoring Freestyle Libre Pro CGM sensor placed today. Patient was educated about wearing sensor, keeping food, activity and medication log and when to call office. Patient was educated about how to care for the sensor and not to have an MRI, CT or Diathermy while wearing the sensor. Follow up was arranged with the patient for 1 week.   Lot #: [id number] Serial #: [id number] Expiration Date: 11/29/2021  Norm Parcel, RD 06/24/2021 11:29 AM.

## 2021-06-24 NOTE — Progress Notes (Signed)
   CC: Diabetes  HPI:  Mr.Olajuwon Melendrez is a 60 y.o. with a PMHx as listed below who presents to the clinic for Diabetes.   Please see the Encounters tab for problem-based Assessment & Plan regarding status of patient's acute and chronic conditions.  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  Gastrointestinal:  Negative for abdominal pain, diarrhea, nausea and vomiting.  Genitourinary:  Positive for frequency and urgency. Negative for dysuria.  Neurological:  Negative for dizziness and headaches.  Endo/Heme/Allergies:  Positive for polydipsia.   Physical Exam:  Vitals:   06/24/21 1035  BP: 130/84  Pulse: 94  Temp: 98.2 F (36.8 C)  TempSrc: Oral  SpO2: 98%  Weight: 205 lb 8 oz (93.2 kg)  Height: 5\' 7"  (1.702 m)   Physical Exam Vitals and nursing note reviewed.  Constitutional:      General: He is not in acute distress.    Appearance: He is normal weight.  HENT:     Head: Normocephalic and atraumatic.  Eyes:     Extraocular Movements: Extraocular movements intact.     Pupils: Pupils are equal, round, and reactive to light.  Pulmonary:     Effort: Pulmonary effort is normal. No respiratory distress.  Skin:    General: Skin is warm and dry.  Neurological:     General: No focal deficit present.     Mental Status: He is alert and oriented to person, place, and time. Mental status is at baseline.     Gait: Gait normal.  Psychiatric:        Mood and Affect: Mood normal.        Behavior:  Behavior normal.        Thought Content: Thought content normal.        Judgment: Judgment normal.    Assessment & Plan:   See Encounters Tab for problem based charting.  Patient seen with Dr. 

## 2021-06-26 NOTE — Progress Notes (Signed)
Internal Medicine Clinic Attending  Case discussed with Dr. Basaraba  At the time of the visit.  We reviewed the resident's history and exam and pertinent patient test results.  I agree with the assessment, diagnosis, and plan of care documented in the resident's note.  

## 2021-07-01 ENCOUNTER — Encounter: Payer: BLUE CROSS/BLUE SHIELD | Admitting: Internal Medicine

## 2021-07-01 ENCOUNTER — Encounter: Payer: BLUE CROSS/BLUE SHIELD | Admitting: Dietician

## 2021-07-06 ENCOUNTER — Encounter: Payer: BLUE CROSS/BLUE SHIELD | Admitting: Internal Medicine

## 2021-07-06 ENCOUNTER — Encounter: Payer: BLUE CROSS/BLUE SHIELD | Admitting: Dietician

## 2021-08-27 ENCOUNTER — Encounter (HOSPITAL_COMMUNITY): Payer: Self-pay

## 2021-08-27 ENCOUNTER — Emergency Department (HOSPITAL_COMMUNITY)
Admission: EM | Admit: 2021-08-27 | Discharge: 2021-08-27 | Disposition: A | Discharge status: Home / Self Care | Payer: BLUE CROSS/BLUE SHIELD | Attending: Emergency Medicine | Admitting: Emergency Medicine

## 2021-08-27 DIAGNOSIS — I10 Essential (primary) hypertension: Secondary | ICD-10-CM | POA: Diagnosis not present

## 2021-08-27 DIAGNOSIS — E119 Type 2 diabetes mellitus without complications: Secondary | ICD-10-CM | POA: Insufficient documentation

## 2021-08-27 DIAGNOSIS — K6289 Other specified diseases of anus and rectum: Secondary | ICD-10-CM | POA: Insufficient documentation

## 2021-08-27 DIAGNOSIS — R109 Unspecified abdominal pain: Secondary | ICD-10-CM | POA: Insufficient documentation

## 2021-08-27 MED ORDER — OXYCODONE-ACETAMINOPHEN 5-325 MG PO TABS
1.0000 | ORAL_TABLET | Freq: Four times a day (QID) | ORAL | 0 refills | Status: DC | PRN
Start: 2021-08-27 — End: 2022-09-13

## 2021-08-27 MED ORDER — DOCUSATE SODIUM 100 MG PO CAPS: 100.0000 mg | ORAL_CAPSULE | Freq: Two times a day (BID) | ORAL | 0 refills | Status: DC

## 2021-08-27 MED ORDER — DOXYCYCLINE HYCLATE 100 MG PO CAPS: 100.0000 mg | ORAL_CAPSULE | Freq: Two times a day (BID) | ORAL | 0 refills | Status: DC

## 2021-08-27 NOTE — Discharge Instructions (Signed)
Call [facility] surgery and get an appointment in 2 to 3 days for recheck.  You also need to get your blood pressure rechecked in the next couple weeks

## 2021-08-27 NOTE — ED Provider Notes (Signed)
[facility] DEPT Provider Note   CSN: [id number] Arrival date & time: 08/27/21  0845     History  Chief Complaint  Patient presents with   Abscess    Ricky Boyle is a 61 y.o. male.  Patient complains of rectal pain for the last few days.  He is not having any bleeding.  Patient has a history of diabetes and hypertension.  No fever  The history is provided by the patient and medical records. No language interpreter was used.  Abdominal Pain Pain location: Rectal. Pain quality: aching   Pain radiates to:  Does not radiate Pain severity:  Moderate Onset quality:  Sudden Timing:  Constant Progression:  Waxing and waning Chronicity:  New Context: not alcohol use   Relieved by:  Nothing Worsened by:  Nothing Associated symptoms: no chest pain, no cough, no diarrhea, no fatigue and no hematuria       Home Medications Prior to Admission medications   Medication Sig Start Date End Date Taking? Authorizing Provider  docusate sodium (COLACE) 100 MG capsule Take 1 capsule (100 mg total) by mouth every 12 (twelve) hours. 08/27/21  Yes Milton Ferguson, MD  doxycycline (VIBRAMYCIN) 100 MG capsule Take 1 capsule (100 mg total) by mouth 2 (two) times daily. 08/27/21  Yes Milton Ferguson, MD  oxyCODONE-acetaminophen (PERCOCET) 5-325 MG tablet Take 1 tablet by mouth every 6 (six) hours as needed. 08/27/21  Yes Milton Ferguson, MD  amLODipine-olmesartan (AZOR) 10-40 MG tablet Take 1 tablet by mouth daily. 06/24/21   Jose Persia, MD  glucose blood (CONTOUR TEST) test strip Use as instructed 06/24/21   Jose Persia, MD  insulin degludec (TRESIBA) 100 UNIT/ML FlexTouch Pen Inject 20 Units into the skin daily. 06/24/21   Jose Persia, MD      Allergies    Bee venom, Shrimp [shellfish allergy], and Penicillins    Review of Systems   Review of Systems  Constitutional:  Negative for appetite change and fatigue.  HENT:  Negative for congestion, ear  discharge and sinus pressure.   Eyes:  Negative for discharge.  Respiratory:  Negative for cough.   Cardiovascular:  Negative for chest pain.  Gastrointestinal:  Positive for abdominal pain. Negative for diarrhea.  Genitourinary:  Negative for frequency and hematuria.       Rectal pain  Musculoskeletal:  Positive for extremity weakness. Negative for back pain.  Skin:  Negative for rash.  Neurological:  Negative for seizures and headaches.  Psychiatric/Behavioral:  Negative for hallucinations.    Physical Exam Updated Vital Signs BP (!) 180/112 (BP Location: Left Arm)    Pulse 94    Temp 98.7 F (37.1 C) (Oral)    Resp 18    Ht 5\' 7"  (1.702 m)    Wt 94.3 kg    SpO2 93%    BMI 32.58 kg/m  Physical Exam Vitals and nursing note reviewed.  Constitutional:      Appearance: He is well-developed.  HENT:     Head: Normocephalic.     Nose: Nose normal.  Eyes:     General: No scleral icterus.    Conjunctiva/sclera: Conjunctivae normal.  Neck:     Thyroid: No thyromegaly.  Cardiovascular:     Rate and Rhythm: Normal rate and regular rhythm.     Heart sounds: No murmur heard.   No friction rub. No gallop.  Pulmonary:     Breath sounds: No stridor. No wheezing or rales.  Chest:     Chest wall:  No tenderness.  Abdominal:     General: There is no distension.     Tenderness: There is no abdominal tenderness. There is no rebound.  Genitourinary:    Comments: No masses seen on anus.  Patient is very tender inside his rectum.  He refused a complete digital exam.  No obvious abscesses Musculoskeletal:        General: Normal range of motion.     Cervical back: Neck supple.  Lymphadenopathy:     Cervical: No cervical adenopathy.  Skin:    Findings: No erythema or rash.  Neurological:     Mental Status: He is alert and oriented to person, place, and time.     Motor: No abnormal muscle tone.     Coordination: Coordination normal.  Psychiatric:        Behavior: Behavior normal.    ED  Results / Procedures / Treatments   Labs (all labs ordered are listed, but only abnormal results are displayed) Labs Reviewed - No data to display  EKG None  Radiology No results found.  Procedures Procedures    Medications Ordered in ED Medications - No data to display  ED Course/ Medical Decision Making/ A&P                           Medical Decision Making Risk OTC drugs. Prescription drug management.   Patient with rectal pain no external hemorrhoids or abscess seen.  Digital exam not done because patient refused.  He is placed on doxycycline Vicodin and Colace.  He is told to follow-up with general surgery in 2 to 3 days and to get his blood pressure rechecked as its been elevated  This patient presents to the ED for concern of rectal pain, this involves an extensive number of treatment options, and is a complaint that carries with it a high risk of complications and morbidity.  The differential diagnosis includes internal hemorrhoid, anal fissure, perirectal abscess   Co morbidities that complicate the patient evaluation  Diabetes and hypertension   Additional history obtained:  Additional history obtained from patient External records from outside source obtained and reviewed including hospital record   Lab Tests: No labs  Imaging Studies ordered:  No x-rays  Cardiac Monitoring:  The patient was maintained on a cardiac monitor.  I personally viewed and interpreted the cardiac monitored which showed an underlying rhythm of: Normal sinus rhythm   Medicines ordered and prescription drug management: No medicines given in emergency department  Test Considered: CT abdomen none   Critical Interventions:  None  Consultations Obtained:  No consult  Problem List / ED Course: Rectal pain, diabetes hypertension    Reevaluation:  After the interventions noted above, I reevaluated the patient and found that they have :stayed the same   Social  Determinants of Health:  None   Dispostion:  After consideration of the diagnostic results and the patients response to treatment, I feel that the patent would benefit from discharged home with antibiotics pain medicine and Colace and told to follow-up with general surgery in 2 to 3 days.          Final Clinical Impression(s) / ED Diagnoses Final diagnoses:  Rectal pain    Rx / DC Orders ED Discharge Orders          Ordered    doxycycline (VIBRAMYCIN) 100 MG capsule  2 times daily        08/27/21 [id number]  docusate sodium (COLACE) 100 MG capsule  Every 12 hours        08/27/21 0942    oxyCODONE-acetaminophen (PERCOCET) 5-325 MG tablet  Every 6 hours PRN        08/27/21 [id number]              Milton Ferguson, MD 08/27/21 [phone]

## 2021-08-27 NOTE — ED Triage Notes (Signed)
Pt also reports yellowish, blood tinged drainage from arm wound that was lanced at [facility].

## 2021-08-27 NOTE — ED Triage Notes (Signed)
Pt reports multiple boil-like swellings on anus x3 days. Has has similar in past but not in this area. Attempted hot baths with no relief.

## 2021-11-24 ENCOUNTER — Other Ambulatory Visit: Payer: Self-pay | Admitting: Internal Medicine

## 2021-11-24 NOTE — Progress Notes (Signed)
Reviewed patient's CGM, and 91% of values are above target. He missed his last several appointments, but I've asked the staff to attempt to reschedule him for an appointment in [facility] in next 2-3 weeks to discuss his diabetes in more detail. ?

## 2021-11-30 ENCOUNTER — Other Ambulatory Visit: Payer: Self-pay

## 2021-11-30 MED ORDER — INSULIN DEGLUDEC 100 UNIT/ML ~~LOC~~ SOPN: 20.0000 [IU] | PEN_INJECTOR | Freq: Every day | SUBCUTANEOUS | 1 refills | Status: DC

## 2021-12-14 ENCOUNTER — Encounter: Payer: BLUE CROSS/BLUE SHIELD | Admitting: Internal Medicine

## 2021-12-16 ENCOUNTER — Encounter: Payer: Self-pay | Admitting: Internal Medicine

## 2022-01-16 IMAGING — CT CT ANGIO CHEST-ABD-PELV FOR DISSECTION W/ AND WO/W CM
2 of 7 series · 11 of 46 positions shown, 12 images · IV contrast (Omni 300)
Comparison: Radiograph 01/11/2020, CT abdomen pelvis 07/07/2009

CLINICAL DATA: Abdominal pain, aortic dissection suspected

EXAM:
CT ANGIOGRAPHY CHEST, ABDOMEN AND PELVIS
TECHNIQUE: Non-contrast CT of the chest was initially obtained.

[Series 6: dissection 2mm · axial · 0.95mm/px · z∈[+689,+1351]mm · 8 of 415 slices shown, 9 images]
[im 42/415  soft-tissue]
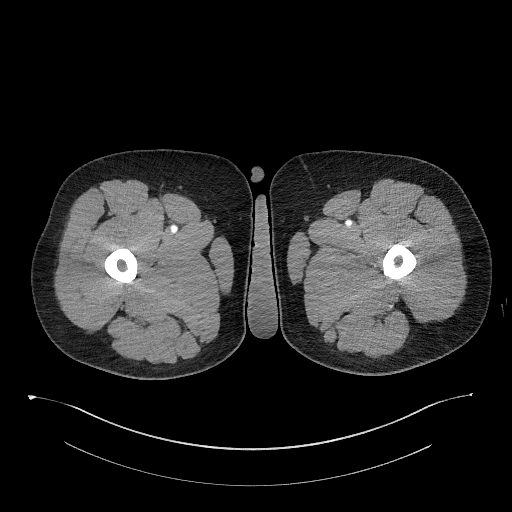
[im 42/415  bone]
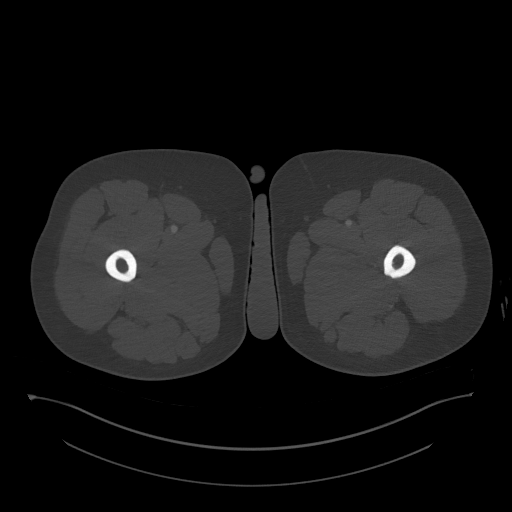
[im 83/415  soft-tissue]
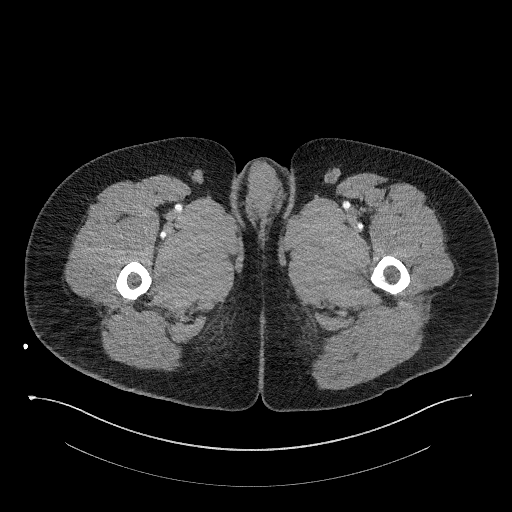
[im 125/415  soft-tissue]
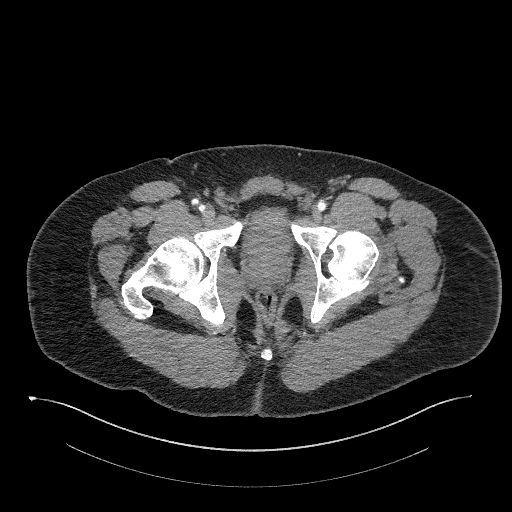
[im 187/415  soft-tissue]
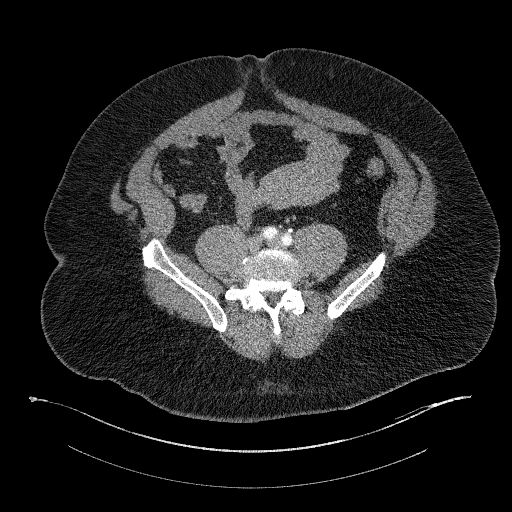
[im 228/415  soft-tissue]
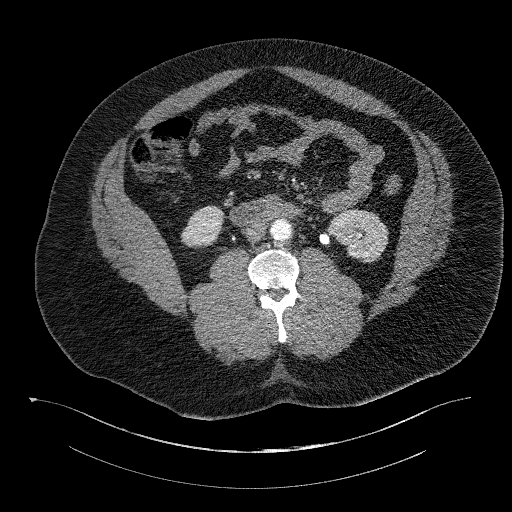
[im 290/415  soft-tissue]
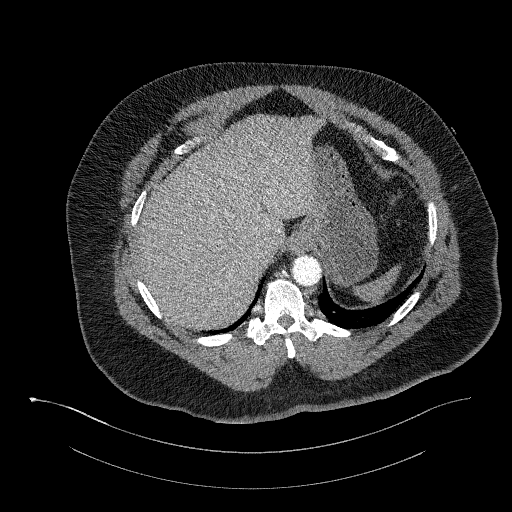
[im 332/415  soft-tissue]
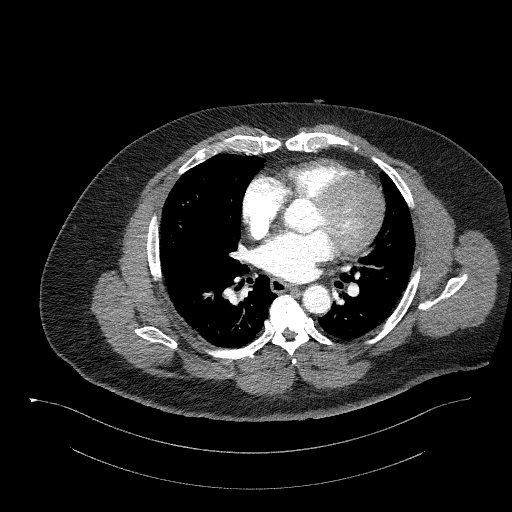
[im 373/415  soft-tissue]
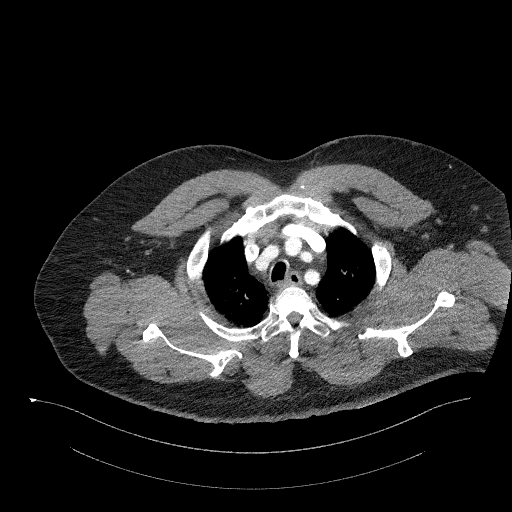

[Series 9: dissection 2mm cor · coronal · 0.89mm/px · 3 of 187 slices shown]
[im 47/187  soft-tissue]
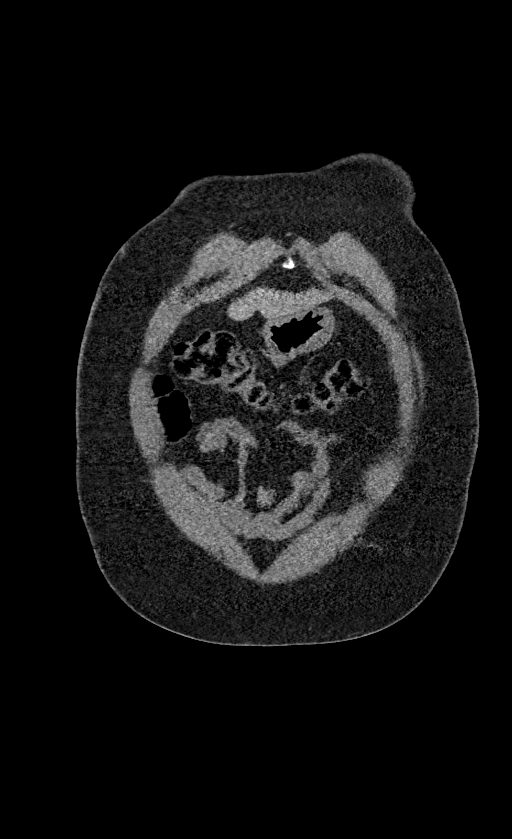
[im 94/187  soft-tissue]
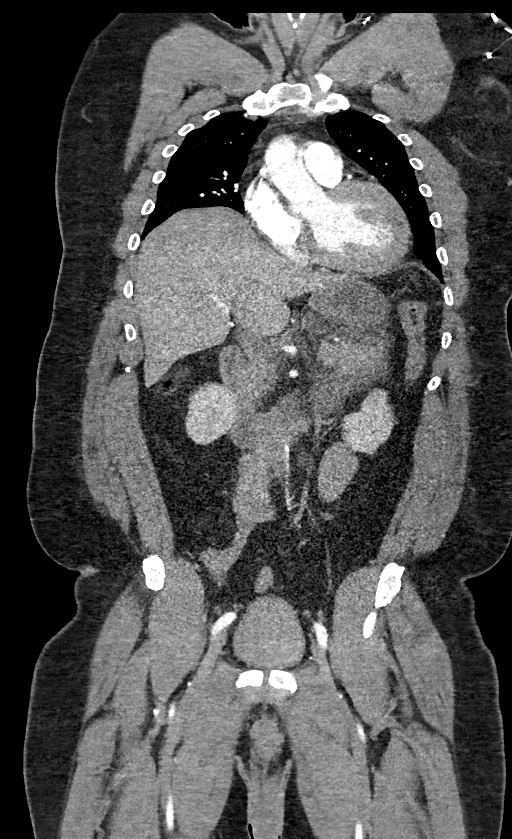
[im 140/187  soft-tissue]
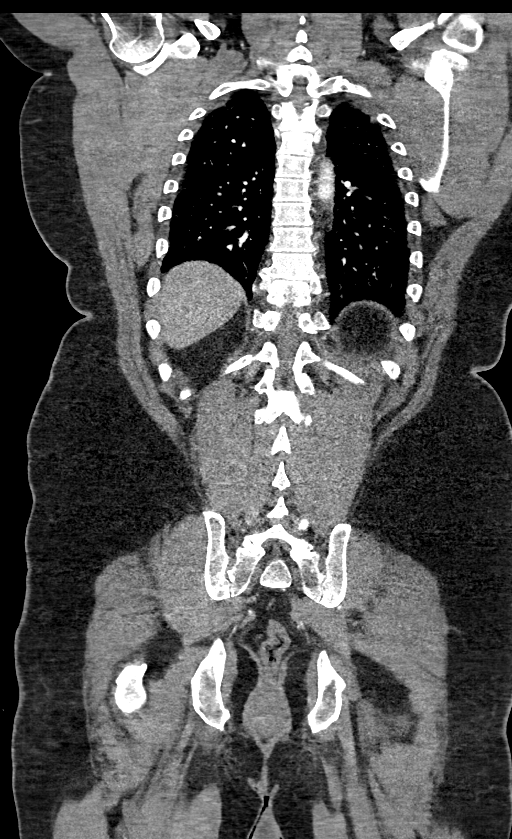

[11 of 46 positions shown; findings below may reference images not displayed]

Multidetector CT imaging through the chest, abdomen and pelvis was
performed using the standard protocol during bolus administration of
intravenous contrast. Multiplanar reconstructed images and MIPs were
obtained and reviewed to evaluate the vascular anatomy.

CONTRAST:  100mL OMNIPAQUE IOHEXOL 350 MG/ML SOLN
FINDINGS: CTA CHEST FINDINGS

Cardiovascular: Noncontrast CT of the chest was performed initially.
This reveals a normal caliber thoracic aorta without hyperdense
mural thickening suggest intramural hematoma. Postcontrast
administration there is satisfactory opacification of the aorta. The
aortic root is suboptimally assessed given cardiac pulsation
artifact. No acute luminal abnormality of the imaged aorta. No
periaortic stranding or hemorrhage. Shared origin of the
brachiocephalic and left common carotid arteries. Mild tortuosity of
brachiocephalic vessels. Proximal great vessels are otherwise
unremarkable. Central pulmonary arteries are normal caliber. No
large central, lobar proximal segmental filling defects on this non
tailored examination of the pulmonary arteries. Normal heart size.
No pericardial effusion.

Mediastinum/Nodes: No mediastinal fluid or gas. Normal thyroid gland
and thoracic inlet. No acute abnormality of the trachea or
esophagus. No worrisome mediastinal, hilar or axillary adenopathy.

Lungs/Pleura: Paraseptal emphysematous changes are present within
apical predominance. Some atelectatic changes in low volumes are
present likely accentuated by imaging during exhalation for the
angiographic technique. No consolidation, features of edema,
pneumothorax, or effusion. No concerning pulmonary nodules or
masses.

Musculoskeletal: Mild degenerative changes present in the thoracic
spine. No concerning chest wall lesions.

Review of the MIP images confirms the above findings.

CTA ABDOMEN AND PELVIS FINDINGS

VASCULAR

Evaluation limited by suboptimal opacification and image noise
secondary to body habitus.

Aorta: Scattered calcified and noncalcified atheromatous plaque
within the abdominal aorta. No significant stenosis. Normal caliber
aorta without aneurysm, dissection, or features of
vasculitis/aortitis.

Celiac: Patent ostium. Normal opacification and branching pattern.
No evidence of aneurysm, dissection or vasculitis.

SMA: Patent without evidence of aneurysm, dissection, vasculitis or
significant stenosis.

Renals: Single renal arteries bilaterally. Both are widely patent
with normal opacification. No aneurysm, dissection, vasculitis or
features of fibromuscular dysplasia.

IMA: Grossly patent with normal opacification. No acute luminal
abnormality or features of vasculitis.

Inflow: Minimal plaque within the common and internal iliac segments
bilaterally. No significant stenosis. No acute luminal abnormality.
No aneurysm or ectasia. Minimal plaque in the proximal outflow
vasculature, specifically the common and superficial femoral
arteries bilaterally without thickened significant flow-limiting
stenosis or acute luminal abnormality.

Veins: No obvious venous abnormality within the limitations of this
arterial phase study.

Review of the MIP images confirms the above findings.

NON-VASCULAR

Hepatobiliary: No worrisome focal liver lesions. Smooth liver
surface contour. Normal hepatic attenuation. Normal gallbladder and
biliary tree. No visible calcified gallstones.

Pancreas: Diffusely edematous appearance of the pancreas with
peripancreatic fluid and phlegmon centered upon the pancreatic body
and tail. Relative sparing of the head and uncinate. No focal
hypoattenuation or evidence of pancreatic necrosis. No pancreatic
ductal dilatation.

Spleen: Normal in size. No concerning splenic lesions.

Adrenals/Urinary Tract: Normal adrenal glands. Kidneys are normally
located with symmetric enhancement. Subcentimeter hypoattenuating
focus in the posterior interpolar right kidney (6/167) too small to
fully characterize on CT imaging but statistically likely benign. No
suspicious renal lesion, urolithiasis or hydronephrosis. Urinary
bladder is largely decompressed at the time of exam and therefore
poorly evaluated by CT imaging. No gross bladder abnormality.

Stomach/Bowel: Distal esophagus, stomach and duodenal sweep are
unremarkable. No small bowel wall thickening or dilatation. No
evidence of obstruction. A normal appendix is visualized. No colonic
dilatation or wall thickening.

Lymphatic: No suspicious or enlarged lymph nodes in the included
lymphatic chains.

Reproductive: Coarse eccentric calcification of the prostate. No
concerning abnormalities of the prostate or seminal vesicles.

Other: Free fluid centered about the pancreas and in the left
anterior pararenal space, likely related to the pancreatic process.
No other abdominopelvic free air or fluid. Mild ventral diastasis
recti of with superimposed fat containing umbilical hernia. No bowel
containing hernias. Minimal body wall edema.

Musculoskeletal: No acute osseous abnormality or suspicious osseous
lesion. Mild multilevel degenerative changes are present in the
imaged portions of the lumbar spine. Mild degenerative changes in
the hips and pelvis as well.

Review of the MIP images confirms the above findings.
IMPRESSION: 1. No evidence of acute aortic syndrome.
2. Findings suggest an acute interstitial edematous pancreatitis
with peripancreatic fluid and phlegmon centered upon the pancreatic
body and tail. No focal hypoattenuation or evidence of pancreatic
necrosis.
3. No visible calcified gallstones. No pancreatic ductal dilatation.
4. Mild ventral diastasis recti with superimposed fat containing
umbilical hernia. No bowel containing hernias.
5. Atelectatic changes in the lungs without other acute
intrathoracic process.
6. Aortic Atherosclerosis (4Y34D-RIM.M)
7. Emphysema (4Y34D-ZP0.M).

## 2022-01-27 ENCOUNTER — Other Ambulatory Visit: Payer: Self-pay

## 2022-01-27 DIAGNOSIS — I10 Essential (primary) hypertension: Secondary | ICD-10-CM

## 2022-01-27 MED ORDER — INSULIN DEGLUDEC 100 UNIT/ML ~~LOC~~ SOPN: 20.0000 [IU] | PEN_INJECTOR | Freq: Every day | SUBCUTANEOUS | 1 refills | Status: DC

## 2022-01-27 MED ORDER — CONTOUR NEXT TEST VI STRP: ORAL_STRIP | 12 refills | Status: DC

## 2022-01-27 MED ORDER — AMLODIPINE-OLMESARTAN 10-40 MG PO TABS: 1.0000 | ORAL_TABLET | Freq: Every day | ORAL | 1 refills | Status: DC

## 2022-01-27 NOTE — Telephone Encounter (Signed)
Talked to pt who stated he's homeless. And he needs refill on his medications, BP med and insulin. He has been out meds for "several days" Stated he thinks his BP is high. C/o flank pain. No available appts this week. Advised pt to go to UC or ER if he's not feeling well - stated he probably will.  Appt has been scheduled on 7/5. Pt has missed several appt s- instructed pt to keep this appt.  Pt stated he needs refill contour next test strips also.

## 2022-01-27 NOTE — Telephone Encounter (Signed)
Agree with plan. Patient can follow up sooner if need be and may need a SW referral to assist with homelesness.

## 2022-01-28 ENCOUNTER — Other Ambulatory Visit: Payer: Self-pay | Admitting: *Deleted

## 2022-01-28 MED ORDER — CONTOUR NEXT TEST VI STRP: ORAL_STRIP | 12 refills | Status: DC

## 2022-01-28 NOTE — Telephone Encounter (Signed)
Call from pt - pharmacy stated insurance will not pay for Contour next test strips w/o specific directions (how often is the pt testing). Send new rx Thanks

## 2022-01-28 NOTE — Telephone Encounter (Signed)
Called pt to informed him test strips rx has been done - no answer; left message to call the pharmacy.

## 2022-02-03 ENCOUNTER — Encounter: Payer: BLUE CROSS/BLUE SHIELD | Admitting: Student

## 2022-02-08 ENCOUNTER — Encounter: Payer: BLUE CROSS/BLUE SHIELD | Admitting: Student

## 2022-06-02 ENCOUNTER — Other Ambulatory Visit: Payer: Self-pay

## 2022-06-02 DIAGNOSIS — I10 Essential (primary) hypertension: Secondary | ICD-10-CM

## 2022-06-02 MED ORDER — INSULIN DEGLUDEC 100 UNIT/ML ~~LOC~~ SOPN: 20.0000 [IU] | PEN_INJECTOR | Freq: Every day | SUBCUTANEOUS | 1 refills | Status: DC

## 2022-06-02 MED ORDER — AMLODIPINE-OLMESARTAN 10-40 MG PO TABS: 1.0000 | ORAL_TABLET | Freq: Every day | ORAL | 1 refills | Status: DC

## 2022-06-23 ENCOUNTER — Encounter: Payer: BLUE CROSS/BLUE SHIELD | Admitting: Student

## 2022-07-01 ENCOUNTER — Emergency Department (HOSPITAL_COMMUNITY)
Admission: EM | Admit: 2022-07-01 | Discharge: 2022-07-01 | Discharge status: Against Medical Advice | Payer: BLUE CROSS/BLUE SHIELD | Attending: Emergency Medicine | Admitting: Emergency Medicine

## 2022-07-01 ENCOUNTER — Encounter (HOSPITAL_COMMUNITY): Payer: Self-pay | Admitting: Emergency Medicine

## 2022-07-01 DIAGNOSIS — E119 Type 2 diabetes mellitus without complications: Secondary | ICD-10-CM | POA: Insufficient documentation

## 2022-07-01 DIAGNOSIS — Z5321 Procedure and treatment not carried out due to patient leaving prior to being seen by health care provider: Secondary | ICD-10-CM | POA: Diagnosis not present

## 2022-07-01 DIAGNOSIS — I10 Essential (primary) hypertension: Secondary | ICD-10-CM | POA: Diagnosis not present

## 2022-07-01 DIAGNOSIS — Z79899 Other long term (current) drug therapy: Secondary | ICD-10-CM | POA: Diagnosis not present

## 2022-07-01 NOTE — ED Triage Notes (Addendum)
Pt reports he lost his amlodipine and he cannot get refilled until dec 3 bc insurance wont pay. He was told it will be $600 to pay out of pocket for it.  Denies any other symptoms. Is seeing doctor for follow up regarding diabetes meds and unexplained rapid weight loss.

## 2022-07-01 NOTE — ED Notes (Signed)
PT did not answer x 2

## 2022-07-07 ENCOUNTER — Encounter: Payer: BLUE CROSS/BLUE SHIELD | Admitting: Student

## 2022-07-22 ENCOUNTER — Emergency Department (HOSPITAL_COMMUNITY)
Admission: EM | Admit: 2022-07-22 | Discharge: 2022-07-22 | Disposition: A | Discharge status: Home / Self Care | Payer: BLUE CROSS/BLUE SHIELD | Attending: Emergency Medicine | Admitting: Emergency Medicine

## 2022-07-22 ENCOUNTER — Emergency Department (HOSPITAL_COMMUNITY): Payer: BLUE CROSS/BLUE SHIELD

## 2022-07-22 DIAGNOSIS — R42 Dizziness and giddiness: Secondary | ICD-10-CM | POA: Insufficient documentation

## 2022-07-22 DIAGNOSIS — R202 Paresthesia of skin: Secondary | ICD-10-CM | POA: Diagnosis not present

## 2022-07-22 DIAGNOSIS — Z794 Long term (current) use of insulin: Secondary | ICD-10-CM | POA: Diagnosis not present

## 2022-07-22 DIAGNOSIS — E1165 Type 2 diabetes mellitus with hyperglycemia: Secondary | ICD-10-CM | POA: Insufficient documentation

## 2022-07-22 DIAGNOSIS — G43809 Other migraine, not intractable, without status migrainosus: Secondary | ICD-10-CM

## 2022-07-22 DIAGNOSIS — Z79899 Other long term (current) drug therapy: Secondary | ICD-10-CM | POA: Diagnosis not present

## 2022-07-22 DIAGNOSIS — R739 Hyperglycemia, unspecified: Secondary | ICD-10-CM

## 2022-07-22 DIAGNOSIS — R519 Headache, unspecified: Secondary | ICD-10-CM | POA: Diagnosis not present

## 2022-07-22 DIAGNOSIS — H538 Other visual disturbances: Secondary | ICD-10-CM

## 2022-07-22 DIAGNOSIS — R209 Unspecified disturbances of skin sensation: Secondary | ICD-10-CM | POA: Diagnosis present

## 2022-07-22 LAB — URINALYSIS, ROUTINE W REFLEX MICROSCOPIC
Bacteria, UA: NONE SEEN
Bilirubin Urine: NEGATIVE
Glucose, UA: >=500 mg/dL — AB
Hgb urine dipstick: NEGATIVE
Ketones, ur: 5 mg/dL — AB
Leukocytes,Ua: NEGATIVE
Nitrite: NEGATIVE
Protein, ur: NEGATIVE mg/dL
Specific Gravity, Urine: 1.028 (ref 1.005–1.030)
pH: 6 (ref 5.0–8.0)

## 2022-07-22 LAB — I-STAT CHEM 8, ED
BUN: 21 mg/dL — ABNORMAL HIGH (ref 6–20)
Calcium, Ion: 0.91 mmol/L — ABNORMAL LOW (ref 1.15–1.40)
Chloride: 101 mmol/L (ref 98–111)
Creatinine, Ser: 0.8 mg/dL (ref 0.61–1.24)
Glucose, Bld: 620 mg/dL (ref 70–99)
HCT: 40 % (ref 39.0–52.0)
Hemoglobin: 13.6 g/dL (ref 13.0–17.0)
Potassium: 7.1 mmol/L (ref 3.5–5.1)
Sodium: 129 mmol/L — ABNORMAL LOW (ref 135–145)
TCO2: 25 mmol/L (ref 22–32)

## 2022-07-22 LAB — DIFFERENTIAL
Abs Immature Granulocytes: 0.01 10*3/uL (ref 0.00–0.07)
Basophils Absolute: 0 10*3/uL (ref 0.0–0.1)
Basophils Relative: 1 %
Eosinophils Absolute: 0 10*3/uL (ref 0.0–0.5)
Eosinophils Relative: 1 %
Immature Granulocytes: 0 %
Lymphocytes Relative: 35 %
Lymphs Abs: 2 10*3/uL (ref 0.7–4.0)
Monocytes Absolute: 0.4 10*3/uL (ref 0.1–1.0)
Monocytes Relative: 7 %
Neutro Abs: 3.2 10*3/uL (ref 1.7–7.7)
Neutrophils Relative %: 56 %

## 2022-07-22 LAB — CBC
HCT: 44.3 % (ref 39.0–52.0)
Hemoglobin: 15.8 g/dL (ref 13.0–17.0)
MCH: 33.9 pg (ref 26.0–34.0)
MCHC: 35.7 g/dL (ref 30.0–36.0)
MCV: 95.1 fL (ref 80.0–100.0)
Platelets: 191 10*3/uL (ref 150–400)
RBC: 4.66 MIL/uL (ref 4.22–5.81)
RDW: 12.1 % (ref 11.5–15.5)
WBC: 5.7 10*3/uL (ref 4.0–10.5)
nRBC: 0 % (ref 0.0–0.2)

## 2022-07-22 LAB — COMPREHENSIVE METABOLIC PANEL
ALT: 34 U/L (ref 0–44)
AST: 20 U/L (ref 15–41)
Albumin: 3.1 g/dL — ABNORMAL LOW (ref 3.5–5.0)
Alkaline Phosphatase: 112 U/L (ref 38–126)
Anion gap: 10 (ref 5–15)
BUN: 16 mg/dL (ref 6–20)
CO2: 23 mmol/L (ref 22–32)
Calcium: 8.8 mg/dL — ABNORMAL LOW (ref 8.9–10.3)
Chloride: 97 mmol/L — ABNORMAL LOW (ref 98–111)
Creatinine, Ser: 1.05 mg/dL (ref 0.61–1.24)
GFR, Estimated: >60 mL/min (ref >60)
Glucose, Bld: 686 mg/dL (ref 70–99)
Potassium: 4.2 mmol/L (ref 3.5–5.1)
Sodium: 130 mmol/L — ABNORMAL LOW (ref 135–145)
Total Bilirubin: 1.1 mg/dL (ref 0.3–1.2)
Total Protein: 6.2 g/dL — ABNORMAL LOW (ref 6.5–8.1)

## 2022-07-22 LAB — RAPID URINE DRUG SCREEN, HOSP PERFORMED
Amphetamines: NOT DETECTED
Barbiturates: NOT DETECTED
Benzodiazepines: NOT DETECTED
Cocaine: POSITIVE — AB
Opiates: NOT DETECTED
Tetrahydrocannabinol: NOT DETECTED

## 2022-07-22 LAB — CBG MONITORING, ED
Glucose-Capillary: 469 mg/dL — ABNORMAL HIGH (ref 70–99)
Glucose-Capillary: 369 mg/dL — ABNORMAL HIGH (ref 70–99)
Glucose-Capillary: 419 mg/dL — ABNORMAL HIGH (ref 70–99)
Glucose-Capillary: 205 mg/dL — ABNORMAL HIGH (ref 70–99)

## 2022-07-22 LAB — PROTIME-INR
INR: 0.9 (ref 0.8–1.2)
Prothrombin Time: 11.9 seconds (ref 11.4–15.2)

## 2022-07-22 LAB — MAGNESIUM: Magnesium: 1.9 mg/dL (ref 1.7–2.4)

## 2022-07-22 LAB — BETA-HYDROXYBUTYRIC ACID: Beta-Hydroxybutyric Acid: 0.18 mmol/L (ref 0.05–0.27)

## 2022-07-22 LAB — ETHANOL: Alcohol, Ethyl (B): <10 mg/dL (ref <10)

## 2022-07-22 LAB — APTT: aPTT: 25 seconds (ref 24–36)

## 2022-07-22 MED ORDER — KETOROLAC TROMETHAMINE 15 MG/ML IJ SOLN
15.0000 mg | Freq: Once | INTRAMUSCULAR | Status: AC
  Administered 2022-07-22: 15 mg via INTRAVENOUS
  Filled 2022-07-22: qty 1

## 2022-07-22 MED ORDER — INSULIN REGULAR(HUMAN) IN NACL 100-0.9 UT/100ML-% IV SOLN
INTRAVENOUS | Status: DC
  Administered 2022-07-22: 11.5 [IU]/h via INTRAVENOUS
  Filled 2022-07-22: qty 100

## 2022-07-22 MED ORDER — LACTATED RINGERS IV SOLN: INTRAVENOUS | Status: DC

## 2022-07-22 MED ORDER — POTASSIUM CHLORIDE CRYS ER 20 MEQ PO TBCR
40.0000 meq | EXTENDED_RELEASE_TABLET | Freq: Once | ORAL | Status: AC
  Administered 2022-07-22: 40 meq via ORAL
  Filled 2022-07-22: qty 2

## 2022-07-22 MED ORDER — DEXTROSE 50 % IV SOLN
0.0000 mL | INTRAVENOUS | Status: DC | PRN
  Filled 2022-07-22: qty 50

## 2022-07-22 MED ORDER — LACTATED RINGERS IV BOLUS
20.0000 mL/kg | Freq: Once | INTRAVENOUS | Status: AC
  Administered 2022-07-22: 1614 mL via INTRAVENOUS

## 2022-07-22 NOTE — ED Provider Triage Note (Signed)
Emergency Medicine Provider Triage Evaluation Note  Ricky Boyle , a 61 y.o. male  was evaluated in triage.  Pt complains of headahce with bilateral hand tingling and blurry vision. He is not taking any of his BP medications, but told another staff member that he was taking it.  Review of Systems  Positive:  Negative:   Physical Exam  BP (!) 177/105 (BP Location: Left Arm)   Pulse 94   Temp 99.1 F (37.3 C) (Oral)   Resp 18   SpO2 97%  Gen:   Awake, no distress   Resp:  Normal effort  MSK:   Moves extremities without difficulty Other:  Possible left sided facial droop. No tenderness to bilateral temples. Patient refused to smile. Visual deficiets in the lateral aspects, mainly on the right.   Medical Decision Making  Medically screening exam initiated at 2:51 PM.  Appropriate orders placed.  Ricky Boyle was informed that the remainder of the evaluation will be completed by another provider, this initial triage assessment does not replace that evaluation, and the importance of remaining in the ED until their evaluation is complete.  Although patient is not cooperative with exam, code stroke activated.    Achille Rich, PA-C 07/22/22 1455

## 2022-07-22 NOTE — ED Notes (Signed)
Patient provided with sandwich and ginger ale per request 

## 2022-07-22 NOTE — ED Notes (Signed)
Insulin IV d/c at this time per MD order

## 2022-07-22 NOTE — ED Triage Notes (Signed)
Patient here from home reporting left side facial droop, visual changes/blurred, and bilateral facial numbness x1 hour. Episode vomiting last night. Ambulatory.

## 2022-07-22 NOTE — Discharge Instructions (Addendum)
Increase your Evaristo Bury as we discussed to 28 units.  Follow up with your doctor in the internal medicine clinic to recheck on your blood sugar.

## 2022-07-22 NOTE — Progress Notes (Signed)
1509 stroke cart activated and elert sent to [facility]. Upon activation, pt is in room. Pt has already been assessed by EDP and gone to CT. Pt c/o L hand numbness, bilat temporal pain, and blurry vision. LKW 1230 per pt's report.  1514 TSMD Selina Cooley, MD) connected via stroke cart and assessing pt at this time

## 2022-07-22 NOTE — ED Provider Notes (Signed)
[facility] DEPT Provider Note   CSN: [id number] Arrival date & time: 07/22/22  1422     History  Chief Complaint  Patient presents with   Numbness   Visual Field Change   Tingling    Ricky Boyle is a 61 y.o. male.  HPI   Patient has a history of acute kidney injury, diabetes, renal failure, prior cocaine and opiate use.  Patient presents to the ED for evaluation of acute headache hand tingling, blurred vision, dizziness.  Patient states the symptoms started about 1230 this afternoon.  Patient was seen by her provider at triage and code stroke was activated.  Patient denies any trouble with weakness or numbness in his arms and legs.  He does not have any complaints of trouble with his speech.  He feels like his vision disturbances blurry bilaterally.  He denies any diplopia.  Home Medications Prior to Admission medications   Medication Sig Start Date End Date Taking? Authorizing Provider  amLODipine-olmesartan (AZOR) 10-40 MG tablet Take 1 tablet by mouth daily. 06/02/22   [name], MD  docusate sodium (COLACE) 100 MG capsule Take 1 capsule (100 mg total) by mouth every 12 (twelve) hours. 08/27/21   Milton Ferguson, MD  doxycycline (VIBRAMYCIN) 100 MG capsule Take 1 capsule (100 mg total) by mouth 2 (two) times daily. 08/27/21   Milton Ferguson, MD  glucose blood (CONTOUR NEXT TEST) test strip Check finger stick glucose once daily in the morning before breakfast 01/28/22   Axel Filler, MD  glucose blood (CONTOUR TEST) test strip Use as instructed 06/24/21   Jose Persia, MD  insulin degludec (TRESIBA) 100 UNIT/ML FlexTouch Pen Inject 20 Units into the skin daily. 06/02/22   [name], MD  oxyCODONE-acetaminophen (PERCOCET) 5-325 MG tablet Take 1 tablet by mouth every 6 (six) hours as needed. 08/27/21   Milton Ferguson, MD      Allergies    Bee venom, Shrimp [shellfish allergy], and Penicillins    Review of Systems   Review of  Systems  Physical Exam Updated Vital Signs BP (!) 147/85   Pulse 72   Temp 99.1 F (37.3 C) (Oral)   Resp 18   Ht 1.702 m (5\' 7" )   Wt 80.7 kg   SpO2 98%   BMI 27.88 kg/m  Physical Exam Vitals and nursing note reviewed.  Constitutional:      General: He is not in acute distress.    Appearance: He is well-developed.  HENT:     Head: Normocephalic and atraumatic.     Right Ear: External ear normal.     Left Ear: External ear normal.  Eyes:     General: No visual field deficit or scleral icterus.       Right eye: No discharge.        Left eye: No discharge.     Conjunctiva/sclera: Conjunctivae normal.  Neck:     Trachea: No tracheal deviation.  Cardiovascular:     Rate and Rhythm: Normal rate and regular rhythm.  Pulmonary:     Effort: Pulmonary effort is normal. No respiratory distress.     Breath sounds: Normal breath sounds. No stridor. No wheezing or rales.  Abdominal:     General: Bowel sounds are normal. There is no distension.     Palpations: Abdomen is soft.     Tenderness: There is no abdominal tenderness. There is no guarding or rebound.  Musculoskeletal:        General: No tenderness.  Cervical back: Neck supple.  Skin:    General: Skin is warm and dry.     Findings: No rash.  Neurological:     Mental Status: He is alert and oriented to person, place, and time.     Cranial Nerves: No cranial nerve deficit, dysarthria or facial asymmetry.     Sensory: No sensory deficit.     Motor: No weakness, abnormal muscle tone, seizure activity or pronator drift.     Coordination: Coordination normal.     Comments:  able to hold both legs off bed for 5 seconds, sensation intact in all extremities,  no left or right sided neglect, normal finger-nose exam bilaterally, no nystagmus noted   Psychiatric:        Mood and Affect: Mood normal.     ED Results / Procedures / Treatments   Labs (all labs ordered are listed, but only abnormal results are displayed) Labs  Reviewed  COMPREHENSIVE METABOLIC PANEL - Abnormal; Notable for the following components:      Result Value   Sodium 130 (*)    Chloride 97 (*)    Glucose, Bld 686 (*)    Calcium 8.8 (*)    Total Protein 6.2 (*)    Albumin 3.1 (*)    All other components within normal limits  RAPID URINE DRUG SCREEN, HOSP PERFORMED - Abnormal; Notable for the following components:   Cocaine POSITIVE (*)    All other components within normal limits  URINALYSIS, ROUTINE W REFLEX MICROSCOPIC - Abnormal; Notable for the following components:   Color, Urine COLORLESS (*)    Glucose, UA >=500 (*)    Ketones, ur 5 (*)    All other components within normal limits  I-STAT CHEM 8, ED - Abnormal; Notable for the following components:   Sodium 129 (*)    Potassium 7.1 (*)    BUN 21 (*)    Glucose, Bld 620 (*)    Calcium, Ion 0.91 (*)    All other components within normal limits  CBG MONITORING, ED - Abnormal; Notable for the following components:   Glucose-Capillary 469 (*)    All other components within normal limits  CBG MONITORING, ED - Abnormal; Notable for the following components:   Glucose-Capillary 419 (*)    All other components within normal limits  CBG MONITORING, ED - Abnormal; Notable for the following components:   Glucose-Capillary 369 (*)    All other components within normal limits  CBG MONITORING, ED - Abnormal; Notable for the following components:   Glucose-Capillary 205 (*)    All other components within normal limits  ETHANOL  PROTIME-INR  APTT  CBC  DIFFERENTIAL  OSMOLALITY  BETA-HYDROXYBUTYRIC ACID  BETA-HYDROXYBUTYRIC ACID  BLOOD GAS, VENOUS  MAGNESIUM    EKG EKG Interpretation  Date/Time:  Thursday July 22 2022 15:24:07 EST Ventricular Rate:  78 PR Interval:  150 QRS Duration: 102 QT Interval:  372 QTC Calculation: 424 R Axis:   -29 Text Interpretation: Sinus rhythm LAE, consider biatrial enlargement Borderline left axis deviation Similar to prior Baseline  wander in lead(s) V2 Confirmed by Vivi Barrack [phone]) on 07/22/2022 3:55:19 PM  Radiology CT HEAD CODE STROKE WO CONTRAST  Result Date: 07/22/2022 CLINICAL DATA:  Code stroke.  Neuro deficit, acute, stroke suspected EXAM: CT HEAD WITHOUT CONTRAST TECHNIQUE: Contiguous axial images were obtained from the base of the skull through the vertex without intravenous contrast. RADIATION DOSE REDUCTION: This exam was performed according to the departmental dose-optimization program which includes  automated exposure control, adjustment of the mA and/or kV according to patient size and/or use of iterative reconstruction technique. COMPARISON:  CT head 04/16/2020. FINDINGS: Brain: No evidence of acute large vascular territory infarction, hemorrhage, hydrocephalus, extra-axial collection or mass lesion/mass effect. Vascular: No hyperdense vessel identified. Skull: No acute fracture. Sinuses/Orbits: Largely clear sinuses.  No acute orbital findings. ASPECTS [facility] Stroke Program Early CT Score) total score (0-10 with 10 being normal): 10. IMPRESSION: 1. No evidence of acute intracranial abnormality. 2. ASPECTS is 10. Findings discussed with Sherrell Puller, p.a. via telephone at 3:13 p.m. Electronically Signed   By: Margaretha Sheffield M.D.   On: 07/22/2022 15:13    Procedures Procedures    Medications Ordered in ED Medications  lactated ringers infusion ( Intravenous New Bag/Given 07/22/22 1628)  dextrose 50 % solution 0-50 mL (has no administration in time range)  ketorolac (TORADOL) 15 MG/ML injection 15 mg (15 mg Intravenous Given 07/22/22 1559)  lactated ringers bolus 1,614 mL (0 mLs Intravenous Stopped 07/22/22 1748)  potassium chloride SA (KLOR-CON M) CR tablet 40 mEq (40 mEq Oral Given 07/22/22 1630)    ED Course/ Medical Decision Making/ A&P Clinical Course as of 07/22/22 1950  Thu Jul 22, 2022  1556 I-STAT Chem-8 showed potassium of 7.1.  However metabolic panel shows potassium level of 4.2.   Suspect hemolysis i-STAT version.  Glucose severely elevated at 686 [JK]  1809 CBC is unremarkable.  Alcohol is negative.  UDS positive for cocaine [JK]  1810 Head CT without acute abnormalities. [id number] Discussed with Dr Roel Cluck.  Does not necessarily need admission.  Will monitor longer in the ED [JK]  1939 Blood sugar is down to 205 [JK]    Clinical Course User Index [JK] Dorie Rank, MD                           Medical Decision Making Problems Addressed: Blurred vision: acute illness or injury that poses a threat to life or bodily functions Hyperglycemia: acute illness or injury that poses a threat to life or bodily functions  Amount and/or Complexity of Data Reviewed Labs: ordered. Decision-making details documented in ED Course. Radiology: ordered and independent interpretation performed.  Risk Prescription drug management. Drug therapy requiring intensive monitoring for toxicity.   Presented to ED for evaluation of blurry vision.  Symptoms initially concerning for the possibility of stroke.  Code stroke was activated.  Head CT was without acute findings.  Neurology felt like symptoms were not consistent with acute stroke so tPA was not given.  Patient's labs ended up showing severely elevated blood sugar at 696.  Patient was started on IV insulin drip.  No signs of acute infection.  Patient states he has been compliant with his medications.  Sounds like his blood sugar at baseline is not well-controlled.  He states it has been in the 400s despite him being on his home regimen.  Will consult the hospital discussed with his wife to stabilize his blood sugar and monitor his blood sugar regimen.  Reviewed the case with Dr. Roel Cluck.  Patient was not having any other complaints.  We opted to continue IV insulin treatment here in the ED to monitor.  Patient's blood sugar has now improved to the 200s.  He denies any symptoms at this time and is otherwise feeling well.  No signs of acute  infection.  I will have him increase his home insulin regimen.  We did stress the  importance of close outpatient follow-up with his primary care doctor as he has not seen them in a while and it sounds like his blood sugar has not been well-controlled for a prolonged period of time.        Final Clinical Impression(s) / ED Diagnoses Final diagnoses:  Hyperglycemia  Blurred vision    Rx / DC Orders ED Discharge Orders     None         Dorie Rank, MD 07/22/22 1950

## 2022-07-22 NOTE — Progress Notes (Signed)
Stroke code brief note  LKW 1230. Patient presented with headache and mild L handed numbness. Numbness has resolved. NIHSS = 2 for mild BLE drift. Exam is nonfocal. Patient is not on anticoagulation. CT head no acute process ASPECTS 10. TNK not administered 2/2 symptoms not being consistent with stroke. CTA not performed 2/2 exam not c/w LVO. Full telestroke note to follow.  Bing Neighbors, MD [facility] [phone]  If 7pm- 7am, please page neurology on call as listed in AMION.

## 2022-07-22 NOTE — Consult Note (Signed)
NEUROLOGY TELECONSULTATION NOTE   Date of service: July 22, 2022 Patient Name: Ricky Boyle MRN:  [id number] DOB:  [birth year] Reason for consult: telestroke  Requesting Provider: Dr. Linwood DibblesJon Knapp Consult Participants: myself, patient, bedside RN, telestroke RN [facility] Location of the patient: [facility]  This consult was provided via telemedicine with 2-way video and audio communication. The patient/family was informed that care would be provided in this way and agreed to receive care in this manner.   _ _ _   _ __   _ __ _ _  __ __   _ __   __ _  History of Present Illness   This is a 61 yo man with pmhx DM2 and substance abuse who presents with bitemporal throbbing headache, blurry vision, and mild L handed numbness. Numbness has resolved. NIHSS = 2 for mild BLE drift. Exam is nonfocal. Patient is not on anticoagulation. CT head no acute process ASPECTS 10 on personal review. TNK not administered 2/2 symptoms not being consistent with stroke. CTA not performed 2/2 exam not c/w LVO.    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 Family History  Problem Relation Age of Onset   Hypertension Mother    Hypertension Sister    Cancer Neg Hx    Heart attack Neg Hx    Social History   Socioeconomic History   Marital status: Widowed    Spouse name: Not on file   Number of children: Not on file   Years of education: Not on file    Highest education level: Not on file  Occupational History   Not on file  Tobacco Use   Smoking status: Every Day    Packs/day: 0.50    Years: 25.00    Total pack years: 12.50    Types: Cigarettes   Smokeless tobacco: Never  Substance and Sexual Activity   Alcohol use: No   Drug use: No   Sexual activity: Yes  Other Topics Concern   Not on file  Social History Narrative   Not on file   Social Determinants of Health   Financial Resource Strain: Not on file  Food Insecurity: Not on file  Transportation Needs: Not on file  Physical Activity: Not on file  Stress: Not on file  Social Connections: Not on file   Allergies  Allergen Reactions   Bee Venom Anaphylaxis   Shrimp [Shellfish Allergy] Anaphylaxis   Penicillins Other (See Comments)    Pt does not know what the reaction was    Medications   (Not in a hospital admission)     Current Facility-Administered Medications:    dextrose 50 % solution 0-50 mL, 0-50 mL, Intravenous, PRN, Linwood DibblesKnapp, Jon, MD   insulin regular, human (MYXREDLIN) 100 units/ 100 mL infusion, , Intravenous, Continuous, Linwood DibblesKnapp, Jon, MD, Last Rate: 11.5 mL/hr at 07/22/22 1657, 11.5 Units/hr at 07/22/22 1657  lactated ringers infusion, , Intravenous, Continuous, Dorie Rank, MD, Last Rate: 125 mL/hr at 07/22/22 1628, New Bag at 07/22/22 1628  Current Outpatient Medications:    amLODipine-olmesartan (AZOR) 10-40 MG tablet, Take 1 tablet by mouth daily., Disp: 90 tablet, Rfl: 1   docusate sodium (COLACE) 100 MG capsule, Take 1 capsule (100 mg total) by mouth every 12 (twelve) hours., Disp: 60 capsule, Rfl: 0   doxycycline (VIBRAMYCIN) 100 MG capsule, Take 1 capsule (100 mg total) by mouth 2 (two) times daily., Disp: 20 capsule, Rfl: 0   glucose blood (CONTOUR NEXT TEST) test strip, Check finger stick glucose once daily in the morning before breakfast, Disp: 100 each, Rfl: 12   glucose blood (CONTOUR TEST) test strip, Use as instructed, Disp: 100 each, Rfl:  12   insulin degludec (TRESIBA) 100 UNIT/ML FlexTouch Pen, Inject 20 Units into the skin daily., Disp: 15 mL, Rfl: 1   oxyCODONE-acetaminophen (PERCOCET) 5-325 MG tablet, Take 1 tablet by mouth every 6 (six) hours as needed., Disp: 20 tablet, Rfl: 0  Vitals   Vitals:   07/22/22 1612 07/22/22 1700 07/22/22 1721 07/22/22 1745  BP:  (!) 183/116 (!) 192/120 (!) 148/103  Pulse:  64 62 73  Resp:   18 18  Temp:      TempSrc:      SpO2:  98% 98% 96%  Weight: 80.7 kg     Height: 5\' 7"  (1.702 m)        Body mass index is 27.88 kg/m.  Physical Exam   Exam performed over telemedicine with 2-way video and audio communication and with assistance of bedside RN  Physical Exam Gen: A&O x4, NAD Resp: normal WOB CV: extremities appear well-perfused  Neuro: *MS: A&O x4. Follows multi-step commands.  *Speech: nondysarthric, no aphasia, able to name and repeat *CN: PERRL 77mm, EOMI, VFF by confrontation, sensation intact, smile symmetric, hearing intact to voice *Motor:   Normal bulk.  No tremor, rigidity or bradykinesia. No pronator drift. BUE no drift, BLE minimal symmetric drift *Sensory: SILT. Symmetric. No double-simultaneous extinction.  *Coordination:  Finger-to-nose, heel-to-shin, rapid alternating motions were intact. *Reflexes:  UTA 2/2 tele-exam *Gait: deferred  NIHSS = 2 for BLE drift   Premorbid mRS = 0   Labs   CBC:  Recent Labs  Lab 07/22/22 1501 07/22/22 1547  WBC 5.7  --   NEUTROABS 3.2  --   HGB 15.8 13.6  HCT 44.3 40.0  MCV 95.1  --   PLT 191  --     Basic Metabolic Panel:  Lab Results  Component Value Date   NA 129 (L) 07/22/2022   K 7.1 (HH) 07/22/2022   CO2 23 07/22/2022   GLUCOSE 620 (HH) 07/22/2022   BUN 21 (H) 07/22/2022   CREATININE 0.80 07/22/2022   CALCIUM 8.8 (L) 07/22/2022   GFRNONAA >60 07/22/2022   GFRAA 55 (L) 05/04/2020   Lipid Panel:  Lab Results  Component Value Date   LDLCALC 87 04/20/2020   HgbA1c:  Lab Results  Component  Value Date   HGBA1C 11.8 (H) 06/21/2021   Urine Drug Screen:     Component Value Date/Time   LABOPIA NONE DETECTED 07/22/2022 1559   COCAINSCRNUR POSITIVE (A) 07/22/2022 1559   LABBENZ NONE DETECTED 07/22/2022 1559   AMPHETMU NONE DETECTED 07/22/2022 1559   THCU NONE DETECTED 07/22/2022 1559   LABBARB NONE DETECTED 07/22/2022 1559    Alcohol Level     Component Value Date/Time   ETH <10 07/22/2022 1501  Impression   This is a 61 yo man with pmhx DM2 and substance abuse who presents with bitemporal throbbing headache, blurry vision, and mild L handed numbness. Numbness has resolved. NIHSS = 2 for mild BLE drift. CT head no acute process ASPECTS 10 on personal review. TNK not administered 2/2 symptoms not being consistent with stroke.   Headache has migrainous features and is favored to be etiology of his transient minimal L hand numbness. Stroke would not cause the headache that is his chief complaint. He has no focal findings on exam. No further stroke workup indicated.  Recommendations   - No further stroke workup indicated ______________________________________________________________________   Thank you for the opportunity to take part in the care of this patient. If you have any further questions, please contact the neurology consultation attending.  Signed,  Bing Neighbors, MD [facility] [phone]  If 7pm- 7am, please page neurology on call as listed in AMION.  **Any copied and pasted documentation in this note was written by me in another application not billed for and pasted by me into this document.

## 2022-09-13 ENCOUNTER — Other Ambulatory Visit: Payer: Self-pay

## 2022-09-13 ENCOUNTER — Emergency Department (HOSPITAL_COMMUNITY)
Admission: EM | Admit: 2022-09-13 | Discharge: 2022-09-13 | Disposition: A | Discharge status: Home / Self Care | Payer: Self-pay | Attending: Emergency Medicine | Admitting: Emergency Medicine

## 2022-09-13 ENCOUNTER — Encounter (HOSPITAL_COMMUNITY): Payer: Self-pay

## 2022-09-13 ENCOUNTER — Emergency Department (HOSPITAL_COMMUNITY): Payer: Self-pay

## 2022-09-13 DIAGNOSIS — R109 Unspecified abdominal pain: Secondary | ICD-10-CM | POA: Insufficient documentation

## 2022-09-13 DIAGNOSIS — Z794 Long term (current) use of insulin: Secondary | ICD-10-CM | POA: Insufficient documentation

## 2022-09-13 DIAGNOSIS — Z5900 Homelessness unspecified: Secondary | ICD-10-CM | POA: Insufficient documentation

## 2022-09-13 DIAGNOSIS — E1165 Type 2 diabetes mellitus with hyperglycemia: Secondary | ICD-10-CM | POA: Insufficient documentation

## 2022-09-13 LAB — CBC WITH DIFFERENTIAL/PLATELET
Abs Immature Granulocytes: 0.02 10*3/uL (ref 0.00–0.07)
Basophils Absolute: 0.1 10*3/uL (ref 0.0–0.1)
Basophils Relative: 1 %
Eosinophils Absolute: 0.1 10*3/uL (ref 0.0–0.5)
Eosinophils Relative: 1 %
HCT: 43 % (ref 39.0–52.0)
Hemoglobin: 15.7 g/dL (ref 13.0–17.0)
Immature Granulocytes: 0 %
Lymphocytes Relative: 32 %
Lymphs Abs: 1.9 10*3/uL (ref 0.7–4.0)
MCH: 34.4 pg — ABNORMAL HIGH (ref 26.0–34.0)
MCHC: 36.5 g/dL — ABNORMAL HIGH (ref 30.0–36.0)
MCV: 94.3 fL (ref 80.0–100.0)
Monocytes Absolute: 0.4 10*3/uL (ref 0.1–1.0)
Monocytes Relative: 7 %
Neutro Abs: 3.4 10*3/uL (ref 1.7–7.7)
Neutrophils Relative %: 59 %
Platelets: 151 10*3/uL (ref 150–400)
RBC: 4.56 MIL/uL (ref 4.22–5.81)
RDW: 11.7 % (ref 11.5–15.5)
WBC: 5.9 10*3/uL (ref 4.0–10.5)
nRBC: 0 % (ref 0.0–0.2)

## 2022-09-13 LAB — URINALYSIS, W/ REFLEX TO CULTURE (INFECTION SUSPECTED)
Bilirubin Urine: NEGATIVE
Glucose, UA: NEGATIVE mg/dL
Hgb urine dipstick: NEGATIVE
Ketones, ur: 20 mg/dL — AB
Leukocytes,Ua: NEGATIVE
Nitrite: NEGATIVE
Protein, ur: NEGATIVE mg/dL
Specific Gravity, Urine: 1.027 (ref 1.005–1.030)
pH: 5 (ref 5.0–8.0)

## 2022-09-13 LAB — CBG MONITORING, ED
Glucose-Capillary: >600 mg/dL (ref 70–99)
Glucose-Capillary: >600 mg/dL (ref 70–99)
Glucose-Capillary: 392 mg/dL — ABNORMAL HIGH (ref 70–99)
Glucose-Capillary: 304 mg/dL — ABNORMAL HIGH (ref 70–99)

## 2022-09-13 LAB — BASIC METABOLIC PANEL
Anion gap: 11 (ref 5–15)
BUN: 16 mg/dL (ref 6–20)
CO2: 22 mmol/L (ref 22–32)
Calcium: 9.1 mg/dL (ref 8.9–10.3)
Chloride: 97 mmol/L — ABNORMAL LOW (ref 98–111)
Creatinine, Ser: 1.01 mg/dL (ref 0.61–1.24)
GFR, Estimated: >60 mL/min (ref >60)
Glucose, Bld: 555 mg/dL (ref 70–99)
Potassium: 4.7 mmol/L (ref 3.5–5.1)
Sodium: 130 mmol/L — ABNORMAL LOW (ref 135–145)

## 2022-09-13 MED ORDER — SODIUM CHLORIDE 0.9 % IV BOLUS
1000.0000 mL | Freq: Once | INTRAVENOUS | Status: AC
  Administered 2022-09-13: 1000 mL via INTRAVENOUS

## 2022-09-13 MED ORDER — MELOXICAM 15 MG PO TABS: 15.0000 mg | ORAL_TABLET | Freq: Every day | ORAL | 0 refills | Status: AC

## 2022-09-13 MED ORDER — INSULIN ASPART 100 UNIT/ML IJ SOLN
10.0000 [IU] | Freq: Once | INTRAMUSCULAR | Status: AC
  Administered 2022-09-13: 10 [IU] via SUBCUTANEOUS

## 2022-09-13 MED ORDER — FLUCONAZOLE 150 MG PO TABS
150.0000 mg | ORAL_TABLET | Freq: Once | ORAL | Status: AC
  Administered 2022-09-13: 150 mg via ORAL
  Filled 2022-09-13: qty 1

## 2022-09-13 NOTE — Discharge Instructions (Signed)
Your testing shows that you have slight swelling around your left kidney but this is something that has been seen in the past.  There is no kidney stones, no urinary infection.  Your blood sugar was elevated which is why we gave you the extra IV fluids and insulin.  Please continue to take your medications exactly as prescribed and follow-up with your doctor.  If you are not getting any better within the next several days you should be rechecked in the ER.  I have given you the phone number for the [facility], please make a phone call to make a follow-up appointment that this week

## 2022-09-13 NOTE — ED Provider Triage Note (Signed)
Emergency Medicine Provider Triage Evaluation Note  Ricky Boyle , a 62 y.o. male  was evaluated in triage.  Pt complains of intermittent left flank pain, increased urinary frequency, difficulty with urination, and fatigue over the last 3 days.  Denies fever, blood in the urine or stool, abdominal pain, testicular pain or swelling, or penile discharge.  Hx essential HTN, OSA, DMT2, pancreatitis, chronic tobacco use, polysubstance abuse, pancreatic pseudocyst  Review of Systems  Positive:  Negative: See above  Physical Exam  BP (!) 144/89   Pulse 92   Temp 98.4 F (36.9 C) (Oral)   Resp 20   Ht 5' 7"$  (1.702 m)   Wt 80.7 kg   SpO2 97%   BMI 27.86 kg/m  Gen:   Awake, no distress   Resp:  Normal effort  MSK:   Moves extremities without difficulty  Other:  Mild left flank tenderness.  Abdomen soft, nondistended, nontender.  Sitting comfortably.  Nondiaphoretic.  Medical Decision Making  Medically screening exam initiated at 1:41 PM.  Appropriate orders placed.  Axsel Mikle was informed that the remainder of the evaluation will be completed by another provider, this initial triage assessment does not replace that evaluation, and the importance of remaining in the ED until their evaluation is complete.     Prince Rome, PA-C [phone]

## 2022-09-13 NOTE — ED Notes (Signed)
Critical glucose called from lab 555. Primary RN and Sabra Heck MD notified

## 2022-09-13 NOTE — ED Triage Notes (Signed)
Pt c/o left flank pain, frequent urination, difficulty urination, fatiguex3d.

## 2022-09-13 NOTE — ED Provider Notes (Signed)
[facility] Provider Note   CSN: [id number] Arrival date & time: 09/13/22  1312     History  Chief Complaint  Patient presents with   Flank Pain    Ricky Boyle is a 62 y.o. male.   Flank Pain   This patient is a 62 year old male who is a known diabetic, he takes [place], he does not take any other daily medicines for his diabetes.  He states that he is homeless and has noticed over the last couple of weeks that he has had increasing fatigue and feeling like his mouth is dry, he is urinating much more frequently and is now starting to have some left-sided flank pain.  No fevers no chills no nausea or vomiting, no diarrhea, no swelling.  He denies any blurry vision.  He reports that he just refilled his [place] prescription 2 days ago.  He reports that because of his homeless status he is limited to the food that he can eat and states that he does not have choice over eating healthier foods versus not healthy foods he is just lucky to have anything to eat according to his own words    Home Medications Prior to Admission medications   Medication Sig Start Date End Date Taking? Authorizing Provider  meloxicam (MOBIC) 15 MG tablet Take 1 tablet (15 mg total) by mouth daily for 14 days. 09/13/22 09/27/22 Yes Noemi Chapel, MD  amLODipine-olmesartan (AZOR) 10-40 MG tablet Take 1 tablet by mouth daily. 06/02/22   [name], MD  docusate sodium (COLACE) 100 MG capsule Take 1 capsule (100 mg total) by mouth every 12 (twelve) hours. 08/27/21   Milton Ferguson, MD  glucose blood (CONTOUR NEXT TEST) test strip Check finger stick glucose once daily in the morning before breakfast 01/28/22   Axel Filler, MD  glucose blood (CONTOUR TEST) test strip Use as instructed 06/24/21   Jose Persia, MD  insulin degludec (TRESIBA) 100 UNIT/ML FlexTouch Pen Inject 20 Units into the skin daily. 06/02/22   [name], MD      Allergies    Bee  venom, Shrimp [shellfish allergy], and Penicillins    Review of Systems   Review of Systems  Genitourinary:  Positive for flank pain.  All other systems reviewed and are negative.   Physical Exam Updated Vital Signs BP (!) 144/89   Pulse 92   Temp 98.4 F (36.9 C) (Oral)   Resp 20   Ht 1.702 m (5' 7"$ )   Wt 80.7 kg   SpO2 97%   BMI 27.86 kg/m  Physical Exam Vitals and nursing note reviewed.  Constitutional:      General: He is not in acute distress.    Appearance: He is well-developed.  HENT:     Head: Normocephalic and atraumatic.     Nose: Nose normal. No congestion or rhinorrhea.     Mouth/Throat:     Mouth: Mucous membranes are moist.     Pharynx: No oropharyngeal exudate.     Comments: Mucous membranes are fairly normal-appearing, there is a spot of thrush on the palate Eyes:     General: No scleral icterus.       Right eye: No discharge.        Left eye: No discharge.     Conjunctiva/sclera: Conjunctivae normal.     Pupils: Pupils are equal, round, and reactive to light.  Neck:     Thyroid: No thyromegaly.     Vascular: No  JVD.  Cardiovascular:     Rate and Rhythm: Normal rate and regular rhythm.     Heart sounds: Normal heart sounds. No murmur heard.    No friction rub. No gallop.  Pulmonary:     Effort: Pulmonary effort is normal. No respiratory distress.     Breath sounds: Normal breath sounds. No wheezing or rales.  Abdominal:     General: Bowel sounds are normal. There is no distension.     Palpations: Abdomen is soft. There is no mass.     Tenderness: There is no abdominal tenderness.     Comments: Nontender abdomen, left flank with mild CVA tenderness  Musculoskeletal:        General: No tenderness. Normal range of motion.     Cervical back: Normal range of motion and neck supple.     Right lower leg: No edema.     Left lower leg: No edema.  Lymphadenopathy:     Cervical: No cervical adenopathy.  Skin:    General: Skin is warm and dry.      Findings: No erythema or rash.  Neurological:     Mental Status: He is alert.     Coordination: Coordination normal.  Psychiatric:        Behavior: Behavior normal.     ED Results / Procedures / Treatments   Labs (all labs ordered are listed, but only abnormal results are displayed) Labs Reviewed  BASIC METABOLIC PANEL - Abnormal; Notable for the following components:      Result Value   Sodium 130 (*)    Chloride 97 (*)    Glucose, Bld 555 (*)    All other components within normal limits  CBC WITH DIFFERENTIAL/PLATELET - Abnormal; Notable for the following components:   MCH 34.4 (*)    MCHC 36.5 (*)    All other components within normal limits  URINALYSIS, W/ REFLEX TO CULTURE (INFECTION SUSPECTED) - Abnormal; Notable for the following components:   Color, Urine STRAW (*)    Ketones, ur 20 (*)    Bacteria, UA RARE (*)    All other components within normal limits  CBG MONITORING, ED - Abnormal; Notable for the following components:   Glucose-Capillary >600 (*)    All other components within normal limits  CBG MONITORING, ED - Abnormal; Notable for the following components:   Glucose-Capillary >600 (*)    All other components within normal limits  CBG MONITORING, ED - Abnormal; Notable for the following components:   Glucose-Capillary 392 (*)    All other components within normal limits    EKG None  Radiology CT Renal Stone Study  Result Date: 09/13/2022 CLINICAL DATA:  Flank pain EXAM: CT ABDOMEN AND PELVIS WITHOUT CONTRAST TECHNIQUE: Multidetector CT imaging of the abdomen and pelvis was performed following the standard protocol without IV contrast. RADIATION DOSE REDUCTION: This exam was performed according to the departmental dose-optimization program which includes automated exposure control, adjustment of the mA and/or kV according to patient size and/or use of iterative reconstruction technique. COMPARISON:  CT abdomen and pelvis 05/02/2020 FINDINGS: Lower chest: No  acute abnormality. Hepatobiliary: No focal liver abnormality is seen. No gallstones, gallbladder wall thickening, or biliary dilatation. Pancreas: Pancreatic pseudocyst in the body of the pancreas has significantly decreased in size now measuring 3.2 x 1.9 cm (previously 15 x 5.5 cm). There is atrophy of the pancreatic body and tail. The pancreatic head appears within normal limits. No acute inflammation. Spleen: Normal in size without focal abnormality.  Adrenals/Urinary Tract: There is mild left-sided hydroureteronephrosis. No urinary tract calculus visualized. The right kidney, adrenal glands and bladder are within normal limits. Stomach/Bowel: Stomach is within normal limits. Appendix appears normal. No evidence of bowel wall thickening, distention, or inflammatory changes. Vascular/Lymphatic: Aortic atherosclerosis. No enlarged abdominal or pelvic lymph nodes. Reproductive: Prostate is unremarkable. Other: There is a small fat containing umbilical hernia. There is no ascites. Musculoskeletal: No acute or significant osseous findings. IMPRESSION: 1. Mild left-sided hydroureteronephrosis of uncertain etiology. No urinary tract calculus visualized. Please correlate clinically. 2. Pancreatic pseudocyst has significantly decreased in size. Aortic Atherosclerosis (ICD10-I70.0). Electronically Signed   By: Ronney Asters M.D.   On: 09/13/2022 17:06    Procedures Procedures    Medications Ordered in ED Medications  sodium chloride 0.9 % bolus 1,000 mL (1,000 mLs Intravenous New Bag/Given 09/13/22 1632)  sodium chloride 0.9 % bolus 1,000 mL (1,000 mLs Intravenous New Bag/Given 09/13/22 1540)  insulin aspart (novoLOG) injection 10 Units (10 Units Subcutaneous Given 09/13/22 1552)  fluconazole (DIFLUCAN) tablet 150 mg (150 mg Oral Given 09/13/22 1552)    ED Course/ Medical Decision Making/ A&P                             Medical Decision Making Amount and/or Complexity of Data Reviewed Labs:  ordered. Radiology: ordered.  Risk Prescription drug management.   This patient presents to the ED for concern of dehydration and hyperglycemia and frequent thirst and urination with flank pain, this involves an extensive number of treatment options, and is a complaint that carries with it a high risk of complications and morbidity.  The differential diagnosis includes kidney stone, urinary infection, hyperglycemia, dehydration, ketosis   Co morbidities that complicate the patient evaluation  Diabetic   Additional history obtained:  Additional history obtained from electronic medical record External records from outside source obtained and reviewed including prior notes prior ER visits prior labs   Lab Tests:  I Ordered, and personally interpreted labs.  The pertinent results include: Hyperglycemia, pseudohyponatremia, mild ketones in the urine however there is no anion gap and his CO2 is normal.   Imaging Studies ordered:  I ordered imaging studies including the scan of the abdomen and pelvis without contrast to evaluate the kidney for possible kidney stone I independently visualized and interpreted imaging which showed some mild hydronephrosis, no acute obstruction seen, no renal dysfunction on labs I agree with the radiologist interpretation   Cardiac Monitoring: / EKG:  The patient was maintained on a cardiac monitor.  I personally viewed and interpreted the cardiac monitored which showed an underlying rhythm of: Normal sinus rhythm   Problem List / ED Course / Critical interventions / Medication management  Given IVF - and insulin I ordered medication including IVF and insuling  for hyperglycemia  Reevaluation of the patient after these medicines showed that the patient improved I have reviewed the patients home medicines and have made adjustments as needed   Social Determinants of Health:  Homeless, diabetic   Test / Admission - Considered:  Considered  admission but the patient is not in DKA, he has no acidosis on labs, he has normal vital signs without tachycardia or hypotension, he is not febrile, he is tolerating p.o.  He spent the last 3 hours in his room watching TV very comfortable without asking for anything.  He is comfortable going home, I do not know why he has mild hydronephrosis but it does  not appear to be obstructive and he has no renal dysfunction.  Follow-up information given         Final Clinical Impression(s) / ED Diagnoses Final diagnoses:  Left flank pain    Rx / DC Orders ED Discharge Orders          Ordered    meloxicam (MOBIC) 15 MG tablet  Daily        09/13/22 1835              Noemi Chapel, MD 09/13/22 1840

## 2022-09-14 ENCOUNTER — Telehealth: Payer: Self-pay | Admitting: *Deleted

## 2022-09-14 NOTE — Transitions of Care (Post Inpatient/ED Visit) (Signed)
   09/14/2022  Name: Ricky Boyle MRN: [id number] DOB: 10-22-1960  Today's TOC FU Call Status: Completed     Transition Care Management Follow-up Telephone Call 09/14/2022    Items Reviewed:  Medications, Lack of stable current housing, continued pain .  Never received  Meloxicam  for pain.    Home Care and Equipment/Supplies: Has Glucose Meter.  Needs refills on strips.     Functional Questionnaire: Does all for himself    Folllow up appointments reviewed: Appointment scheduled for tomorrow .  To bring all meds with him and Glucose Meter.        Jarrett Soho, RN 09/14/2022 3:05 PM.

## 2022-09-15 ENCOUNTER — Ambulatory Visit (INDEPENDENT_AMBULATORY_CARE_PROVIDER_SITE_OTHER): Payer: Self-pay | Admitting: Student

## 2022-09-15 ENCOUNTER — Other Ambulatory Visit: Payer: Self-pay

## 2022-09-15 ENCOUNTER — Other Ambulatory Visit (HOSPITAL_COMMUNITY): Payer: Self-pay

## 2022-09-15 VITALS — BP 159/108 | HR 80 | Temp 97.8°F | Ht 67.0 in | Wt 169.8 lb

## 2022-09-15 DIAGNOSIS — K869 Disease of pancreas, unspecified: Secondary | ICD-10-CM

## 2022-09-15 DIAGNOSIS — F17200 Nicotine dependence, unspecified, uncomplicated: Secondary | ICD-10-CM

## 2022-09-15 DIAGNOSIS — F1990 Other psychoactive substance use, unspecified, uncomplicated: Secondary | ICD-10-CM

## 2022-09-15 DIAGNOSIS — G4733 Obstructive sleep apnea (adult) (pediatric): Secondary | ICD-10-CM

## 2022-09-15 DIAGNOSIS — Z0000 Encounter for general adult medical examination without abnormal findings: Secondary | ICD-10-CM

## 2022-09-15 DIAGNOSIS — F32A Depression, unspecified: Secondary | ICD-10-CM

## 2022-09-15 DIAGNOSIS — F17210 Nicotine dependence, cigarettes, uncomplicated: Secondary | ICD-10-CM

## 2022-09-15 DIAGNOSIS — Z5900 Homelessness unspecified: Secondary | ICD-10-CM

## 2022-09-15 DIAGNOSIS — I10 Essential (primary) hypertension: Secondary | ICD-10-CM

## 2022-09-15 DIAGNOSIS — Z59811 Housing instability, housed, with risk of homelessness: Secondary | ICD-10-CM

## 2022-09-15 DIAGNOSIS — E1169 Type 2 diabetes mellitus with other specified complication: Secondary | ICD-10-CM

## 2022-09-15 LAB — POCT GLYCOSYLATED HEMOGLOBIN (HGB A1C): HbA1c POC (<> result, manual entry): >14 % — AB (ref 4.0–5.6)

## 2022-09-15 LAB — GLUCOSE, CAPILLARY: Glucose-Capillary: 382 mg/dL — ABNORMAL HIGH (ref 70–99)

## 2022-09-15 MED ORDER — INSULIN DEGLUDEC 100 UNIT/ML ~~LOC~~ SOPN
25.0000 [IU] | PEN_INJECTOR | Freq: Every day | SUBCUTANEOUS | 2 refills | Status: DC
  Filled 2022-09-15: qty 15, 60d supply, fill #0

## 2022-09-15 MED ORDER — ACCU-CHEK AVIVA PLUS VI STRP
ORAL_STRIP | 12 refills | Status: DC
  Filled 2022-09-15: qty 100, fill #0

## 2022-09-15 MED ORDER — ACCU-CHEK GUIDE ME W/DEVICE KIT
1.0000 | PACK | Freq: Four times a day (QID) | 1 refills | Status: DC
  Filled 2022-09-15: qty 1, 1d supply, fill #0

## 2022-09-15 MED ORDER — DAPAGLIFLOZIN PROPANEDIOL 5 MG PO TABS
5.0000 mg | ORAL_TABLET | Freq: Every day | ORAL | 1 refills | Status: DC
  Filled 2022-09-15: qty 90, 90d supply, fill #0

## 2022-09-15 MED ORDER — ACCU-CHEK AVIVA PLUS W/DEVICE KIT
1.0000 | PACK | Freq: Four times a day (QID) | 1 refills | Status: DC
  Filled 2022-09-15: qty 1, fill #0

## 2022-09-15 MED ORDER — NOVOLOG FLEXPEN 100 UNIT/ML ~~LOC~~ SOPN
5.0000 [IU] | PEN_INJECTOR | Freq: Three times a day (TID) | SUBCUTANEOUS | 11 refills | Status: DC
  Filled 2022-09-15: qty 15, fill #0

## 2022-09-15 MED ORDER — BUPROPION HCL ER (XL) 150 MG PO TB24
150.0000 mg | ORAL_TABLET | Freq: Every day | ORAL | 1 refills | Status: DC
  Filled 2022-09-15: qty 90, 90d supply, fill #0

## 2022-09-15 MED ORDER — ACCU-CHEK SOFTCLIX LANCETS MISC
12 refills | Status: DC
  Filled 2022-09-15: qty 100, fill #0

## 2022-09-15 NOTE — Patient Instructions (Signed)
Thank you, [name] for allowing [place] to provide your care today. Today we discussed.  Diabetes Please take 25 U of your tresiba and start taking 5 units of novolog with meals and 5 mg farxiga daily. Check your feet daily. We will refer you to a foot doctor and eye doctor. Please check your sugars in the morning and with meals. If you have difficulty getting medication or supplies please call us  Blood pressure Please continue to take your blood pressure medicine and at the next visit we will discuss adjusting this  Depression Please start taking wellbutrin daily and follow up with counseling with [facility]  Smoking The wellbutrin will help with smoking  Difficulties with houseless I have placed a referral to social work.    I have ordered the following labs for you:  Lab Orders         Microalbumin / Creatinine Urine Ratio         BMP8+Anion Gap         Glucose, capillary         POC Hbg A1C       Referrals ordered today:   Referral Orders         Ambulatory referral to [facility] Referral to [facility] ([facility] Patients)         Ambulatory referral to Podiatry         Ambulatory referral to Ophthalmology         Referral to Nutrition and Diabetes Services      I have ordered the following medication/changed the following medications:   Stop the following medications: Medications Discontinued During This Encounter  Medication Reason   glucose blood (CONTOUR TEST) test strip    glucose blood (CONTOUR NEXT TEST) test strip    insulin degludec (TRESIBA) 100 UNIT/ML FlexTouch Pen Reorder     Start the following medications: Meds ordered this encounter  Medications   dapagliflozin propanediol (FARXIGA) 5 MG TABS tablet    Sig: Take 1 tablet (5 mg total) by mouth daily before breakfast.    Dispense:  90 tablet    Refill:  1   insulin degludec (TRESIBA) 100 UNIT/ML FlexTouch Pen    Sig: Inject 25 Units into the skin at  bedtime.    Dispense:  15 mL    Refill:  2   buPROPion (WELLBUTRIN XL) 150 MG 24 hr tablet    Sig: Take 1 tablet (150 mg total) by mouth daily.    Dispense:  90 tablet    Refill:  1   Blood Glucose Monitoring Suppl (ACCU-CHEK AVIVA PLUS) w/Device KIT    Sig: 1 kit by Does not apply route 4 (four) times daily.    Dispense:  1 kit    Refill:  1   glucose blood (ACCU-CHEK AVIVA PLUS) test strip    Sig: Use as instructed    Dispense:  100 each    Refill:  12   insulin aspart (NOVOLOG FLEXPEN) 100 UNIT/ML FlexPen    Sig: Inject 5 Units into the skin 3 (three) times daily with meals.    Dispense:  15 mL    Refill:  11     Follow up:  1 month     Should you have any questions or concerns please call the internal medicine clinic at [phone].    Sanjuana Letters, D.O. [facility]

## 2022-09-16 ENCOUNTER — Telehealth: Payer: Self-pay | Admitting: *Deleted

## 2022-09-16 DIAGNOSIS — Z59811 Housing instability, housed, with risk of homelessness: Secondary | ICD-10-CM | POA: Insufficient documentation

## 2022-09-16 DIAGNOSIS — F1990 Other psychoactive substance use, unspecified, uncomplicated: Secondary | ICD-10-CM | POA: Insufficient documentation

## 2022-09-16 LAB — BMP8+ANION GAP
Anion Gap: 19 mmol/L — ABNORMAL HIGH (ref 10.0–18.0)
BUN/Creatinine Ratio: 15 (ref 10–24)
BUN: 16 mg/dL (ref 8–27)
CO2: 21 mmol/L (ref 20–29)
Calcium: 9.8 mg/dL (ref 8.6–10.2)
Chloride: 97 mmol/L (ref 96–106)
Creatinine, Ser: 1.06 mg/dL (ref 0.76–1.27)
Glucose: 397 mg/dL — ABNORMAL HIGH (ref 70–99)
Potassium: 4.2 mmol/L (ref 3.5–5.2)
Sodium: 137 mmol/L (ref 134–144)
eGFR: 80 mL/min/{1.73_m2} (ref >59)

## 2022-09-16 NOTE — Assessment & Plan Note (Signed)
Assessment: Hx of 1/2 pack per day for the last 26 years. Is interested in stopping, chantix did not work well in the past. Will start wellbutrin.   Plan: - wellbutrin 150 mg daily - follow up at next visit

## 2022-09-16 NOTE — Assessment & Plan Note (Signed)
Assessment: Blood pressure elevated today 159/108. He notes continued adherence to amlodipine-olmesartan 10-40 mg daily. With multiple medication changes today, will follow up in one month. He does not have a CPAP machine for his OSA and has not had a sleep study in years. At follow up will discuss medication changes and repeating sleep study.  Plan: - continue amldoipine-olmesartan 10-69m daily - follow up one month - ongoing discussions about OSA

## 2022-09-16 NOTE — Assessment & Plan Note (Addendum)
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 He has had difficulty following up with appointments and taking his medications secondary to social barriers. He has not been evaluated in our clinic or by another provider since 11/22. He has continued only with tresiba 28 U daily and his meter is broken so he has been unable to track his glucose levels. He has difficulty affording healthy food options and is currently living at a warehouse his daughter owns. His A1c today is >14%. He endorses polyuria.   We will restart his novolog 5 U daily TID. He is not interested in taking metformin after reading the side effects. Discussed farxiga which he is interested in. Referrals placed to diabetes/nutrition counseling in our clinic, podiatry and ophthalmology. Social work referral as well to assist with social barriers that are preventing him in improving.   BMP - renal function intact. Recent ED visit and found to be hyponatremic, likely secondary to hyperglycemia. Anion gap elevated today, normal bicarbonate do not suspect DKA. Will follow up with repeat at next visit.   Plan: - continue tresiba 25 U QHS, novolog 5U with meals, and farxiga 5 mg daily - new meter sent in - follow up in 1 month, repeat A1c in 3 months - referral to podiatry, opthalmology, and diabetic counseling - microalbumin/cr urine pending  Addendum: microalbumin/cr urine  normal

## 2022-09-16 NOTE — Assessment & Plan Note (Signed)
Follow up with routine vaccinations and cancer screening at next visit

## 2022-09-16 NOTE — Assessment & Plan Note (Addendum)
Assessment: Patient currently living in daughters warehouse. It has a heater and he has access to water source. He was living with his sister but after a confrontation he moved out. He does not want to burden other family members and asking to live with them. He has financial barriers as well and is currently applying for disability. We will refer to social work and he was given bus pass/bag of food.   The above has led to significant stressors and he became tearful when discussing. Hopeful wellbutrin will help with mood and will also refer to [facility]  for counseling  Plan: - follow up social work referral

## 2022-09-16 NOTE — Assessment & Plan Note (Signed)
Assessment: Hx of cocaine use, last smoked a few weeks ago. Discussed health benefits of refraining from this.   Plan: - continue cessation discussions at follow up

## 2022-09-16 NOTE — Progress Notes (Signed)
  Care Coordination   Note   09/16/2022 Name: Ricky Boyle MRN: [id number] DOB: [birth year]  Ricky Boyle is a 62 y.o. year old male who sees [name], MD for primary care.  I reached out to [name] in response to a referral    Follow up plan:  Telephone appointment with team member scheduled for:  09/23/2022  Encounter Outcome:  Pt. Scheduled from referral   Julian Hy, [place] Direct Dial: [phone]

## 2022-09-16 NOTE — Progress Notes (Signed)
CC: follow up diabetes  HPI:  [name] is a 62 y.o. male living with a history stated below and presents today for follow up of his chronic medical conditions of diabetes, hypertension, and polysubstance use. Please see problem based assessment and plan for additional details.  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 90 tablet 1   docusate sodium (COLACE) 100 MG capsule Take 1 capsule (100 mg total) by mouth every 12 (twelve) hours. 60 capsule 0   meloxicam (MOBIC) 15 MG tablet Take 1 tablet (15 mg total) by mouth daily for 14 days. 14 tablet 0   No current facility-administered medications on file prior to visit.    Family History  Problem Relation Age of Onset   Hypertension Mother    Hypertension Sister    Cancer Neg Hx    Heart attack Neg Hx     Review of Systems: ROS negative except for what is noted on the assessment and plan.  Vitals:   09/15/22 1433  BP: (!) 159/108  Pulse: 80  Temp: 97.8 F (36.6 C)  TempSrc: Oral  SpO2: 100%  Weight: 169 lb 12.8 oz (77 kg)  Height: 5' 7"$  (1.702 m)    Physical Exam: Constitutional: no acute distress HENT: normocephalic atraumatic Eyes: conjunctiva non-erythematous Neck: supple Cardiovascular: regular rate and rhythm, no m/r/g Pulmonary/Chest: normal work of breathing on room air, lungs clear to auscultation bilaterally MSK: normal bulk and tone. Bilateral feet with  2+ DP and PT pulses. Feet without wounds or sores.  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 He has had difficulty following up with appointments and taking his medications secondary to social barriers. He has not been evaluated in our clinic or by another provider since 11/22. He has continued only with tresiba 28 U daily and his meter is broken so he has been unable to track his glucose levels. He has difficulty affording healthy food options and is currently living at a warehouse his daughter owns. His A1c today is >14%. He endorses polyuria.   We will restart his novolog 5 U daily TID. He is not interested in taking metformin after reading the side effects. Discussed farxiga which he is interested in. Referrals placed to diabetes/nutrition counseling in our clinic, podiatry and ophthalmology. Social work referral as well to assist with social barriers that are preventing him in improving.   BMP - renal function intact. Recent ED visit and found to be hyponatremic, likely secondary to hyperglycemia. Anion gap elevated today, normal bicarbonate do not suspect DKA. Will follow up with repeat at next visit.   Plan: - continue tresiba 25 U QHS, novolog 5U with meals, and farxiga 5 mg daily - new meter sent  in - follow up in 1 month, repeat A1c in 3 months - referral to podiatry, opthalmology, and diabetic counseling - microalbumin/cr urine pending  Essential hypertension Assessment: Blood pressure elevated today 159/108. He notes continued adherence to amlodipine-olmesartan 10-40 mg daily. With multiple medication changes today, will follow up in one month. He does not have a CPAP machine for his OSA and has not had a sleep study in years. At follow up will discuss  medication changes and repeating sleep study.  Plan: - continue amldoipine-olmesartan 10-54m daily - follow up one month - ongoing discussions about OSA  OSA (obstructive sleep apnea) Assessment: Hx of OSA. Has not had a sleep study in recently. Can discuss repeating sleep study at next visit but if unable to wear CPAP due to unstable housing, can delay.   Plan: - follow up sleep study and discuss if willing to use CPAP  Healthcare maintenance Follow up with routine vaccinations and cancer screening at next visit  Tobacco use disorder Assessment: Hx of 1/2 pack per day for the last 26 years. Is interested in stopping, chantix did not work well in the past. Will start wellbutrin.   Plan: - wellbutrin 150 mg daily - follow up at next visit  Polysubstance use disorder Assessment: Hx of cocaine use, last smoked a few weeks ago. Discussed health benefits of refraining from this.   Plan: - continue cessation discussions at follow up  Housing instability, currently housed, at risk for homelessness Assessment: Patient currently living in daughters warehouse. It has a heater and he has access to water source. He was living with his sister but after a confrontation he moved out. He does not want to burden other family members and asking to live with them. He has financial barriers as well and is currently applying for disability. We will refer to social work and he was given bus pass/bag of food.   The above has led to significant stressors and he became tearful when discussing. Hopeful wellbutrin will help with mood and will also [facility] for counseling  Plan: - follow up social work referral  Patient discussed with Dr. MFanny Bien D.O. CSophiaInternal Medicine, PGY-3 Phone: [phone] 1997Date 09/16/2022 Time 10:20 AM

## 2022-09-16 NOTE — Assessment & Plan Note (Signed)
Assessment: Hx of OSA. Has not had a sleep study in recently. Can discuss repeating sleep study at next visit but if unable to wear CPAP due to unstable housing, can delay.   Plan: - follow up sleep study and discuss if willing to use CPAP

## 2022-09-17 ENCOUNTER — Other Ambulatory Visit (HOSPITAL_COMMUNITY): Payer: Self-pay

## 2022-09-17 LAB — MICROALBUMIN / CREATININE URINE RATIO
Creatinine, Urine: 51 mg/dL
Microalb/Creat Ratio: 15 mg/g creat (ref 0–29)
Microalbumin, Urine: 7.6 ug/mL

## 2022-09-20 NOTE — Progress Notes (Signed)
Internal Medicine Clinic Attending  Case discussed with Dr. Johnney Ou  at the time of the visit.  We reviewed the resident's history and exam and pertinent patient test results.  I agree with the assessment, diagnosis, and plan of care documented in the resident's note.

## 2022-09-23 ENCOUNTER — Ambulatory Visit: Payer: Self-pay

## 2022-09-23 DIAGNOSIS — E1169 Type 2 diabetes mellitus with other specified complication: Secondary | ICD-10-CM

## 2022-09-23 NOTE — Patient Instructions (Signed)
Visit Information  Thank you for taking time to visit with me today. Please don't hesitate to contact me if I can be of assistance to you.   Following are the goals we discussed today:   Goals Addressed             This Visit's Progress    COMPLETED: Care Coordination Activities       Care Coordination Interventions: Referral received to assist patient with SDoH needs  Performed chart review to note patient recent A1C >14%, ED visit in December 2023 with blood sugar greater than 690 Discussed patient is transient with no income, he applied for disability in January 2024. Patients current coverage is Medicaid Family Planning. Patient reports he visited [place] on 09/21/22 and is trying to transition to a Managed Medicaid plan but has not yet been approved. Patient lives in a [place] owned by his daughter - refuses to visit a shelter. Reports if it gets very cold he will sleep at his sisters but he cannot do that often as she receives Section 8 Determined the patient has not been able to fill newly prescribed Novolog or Wellbutrin due to costs Discussed SW will collaborate with [place] Team to determine if patient is eligible for pharmacy assistance Advised patient SW will collaborate with Managed Medicaid Director to determine if patient is eligible to work with MM team regarding Disease Management and SW needs Advised the patient to contact the [employer] at [phone] to speak with a housing counselor as patient indicates his daughter will pay for him to live somewhere as long as it is not too expensive Discussed if patient does not hear from MM team, once he receives his MM benefit he is to contact his primary care provider to request a referral for Managed Medicaid team. Patient stated understanding Collaboration with [name] - MM scheduling care guide will outreach patient to schedule with RN Care Manager Collaboration wit [name] - referral placed  to [place] team         If you are experiencing a Mental Health or [place] or need someone to talk to, please call 911  Patient verbalizes understanding of instructions and care plan provided today and agrees to view in [place]. Active MyChart status and patient understanding of how to access instructions and care plan via MyChart confirmed with patient.     No further follow up required: Please contact your primary care provider as needed. A member of that pharmacy team will contact you regarding medication assistance.  Daneen Schick, BSW, CDP Social Worker, Certified Dementia Practitioner [place] [facility]  Care Coordination [phone]

## 2022-09-23 NOTE — Patient Outreach (Signed)
  Care Coordination   Community Resource Consult  Visit Note   09/23/2022 Name: Ricky Boyle MRN: [id number] DOB: [birth year]  Ricky Boyle is a 62 y.o. year old male who sees Ricky Natal, MD for primary care. I spoke with  Ricky Boyle by phone today.  What matters to the patients health and wellness today?  I need my medicine and a place to live    Goals Addressed             This Visit's Progress    COMPLETED: Care Coordination Activities       Care Coordination Interventions: Referral received to assist patient with SDoH needs  Performed chart review to note patient recent A1C >14%, ED visit in December 2023 with blood sugar greater than 690 Discussed patient is transient with no income, he applied for disability in January 2024. Patients current coverage is Medicaid Family Planning. Patient reports he visited [place] on 09/21/22 and is trying to transition to a Managed Medicaid plan but has not yet been approved. Patient lives in a [place] owned by his daughter - refuses to visit a shelter. Reports if it gets very cold he will sleep at his sisters but he cannot do that often as she receives Section 8 Determined the patient has not been able to fill newly prescribed Novolog or Wellbutrin due to costs Discussed SW will collaborate with Ricky Boyle Boyle to determine if patient is eligible for pharmacy assistance Advised patient SW will collaborate with Managed Medicaid Director to determine if patient is eligible to work with MM Boyle regarding Disease Management and SW needs Advised the patient to contact the Ricky Boyle at [phone] to speak with a housing counselor as patient indicates his daughter will pay for him to live somewhere as long as it is not too expensive Discussed if patient does not hear from MM Boyle, once he receives his MM benefit he is to contact his primary care provider to request a referral for Managed Medicaid Boyle. Patient stated  understanding Collaboration with Ricky Boyle - MM scheduling care guide will outreach patient to schedule with RN Care Manager Collaboration wit Ricky Boyle - referral placed to Ricky Boyle Boyle         SDOH assessments and interventions completed:  Yes  SDOH Interventions Today    Flowsheet Row Most Recent Value  SDOH Interventions   Food Insecurity Interventions Intervention Not Indicated  Housing Interventions Other (Comment)  [[facility] [employer] Homelessness Prevention Program]  Financial Strain Interventions Other (Comment)  [pt has applied for Disability and MM,  consult with MM Boyle]        Care Coordination Interventions:  Yes, provided   Interventions Today    Flowsheet Row Most Recent Value  Chronic Disease   Chronic disease during today's visit Hypertension (HTN), Diabetes  General Interventions   General Interventions Discussed/Reviewed General Interventions Discussed, Communication with, Referral to Nurse  [facility] Boyle director]  Education Interventions   Education Provided Provided Education  Provided Verbal Education On [employer], Personal assistant  Pharmacy Interventions   Pharmacy Dicussed/Reviewed Referral to Pharmacist  Referral to Pharmacist Cannot afford medications        Follow up plan: Referral made to Ricky Boyle and Managed Medicaid Boyle.    Encounter Outcome:  Pt. Visit Completed   Ricky Boyle, Ricky Boyle, CDP Social Worker, Certified Dementia Practitioner [facility] Care Management  Care Coordination [phone]

## 2022-09-24 ENCOUNTER — Telehealth: Payer: Self-pay

## 2022-09-24 NOTE — Progress Notes (Signed)
   Care Guide Note  09/24/2022 Name: Ricky Boyle MRN: [id number] DOB: 05-Mar-1961  Referred by: [name], MD Reason for referral : Care Coordination (Outreach to schedule with Pharm d )   Ricky Boyle is a 62 y.o. year old male who is a primary care patient of [name], MD. Ricky Boyle was referred to the pharmacist for assistance related to DM.    Successful contact was made with the patient to discuss pharmacy services including being ready for the pharmacist to call at least 5 minutes before the scheduled appointment time, to have medication bottles and any blood sugar or blood pressure readings ready for review. The patient agreed to meet with the pharmacist via with the pharmacist via telephone visit on (date/time).  09/28/2022  Noreene Larsson, [place], [place] [address] Direct Dial: [phone] [email or web address]

## 2022-09-27 ENCOUNTER — Other Ambulatory Visit (HOSPITAL_COMMUNITY): Payer: Self-pay

## 2022-09-27 ENCOUNTER — Telehealth: Payer: Self-pay | Admitting: *Deleted

## 2022-09-27 ENCOUNTER — Ambulatory Visit (INDEPENDENT_AMBULATORY_CARE_PROVIDER_SITE_OTHER): Payer: Self-pay | Admitting: Licensed Clinical Social Worker

## 2022-09-27 DIAGNOSIS — E1169 Type 2 diabetes mellitus with other specified complication: Secondary | ICD-10-CM

## 2022-09-27 DIAGNOSIS — I10 Essential (primary) hypertension: Secondary | ICD-10-CM

## 2022-09-27 DIAGNOSIS — Z59811 Housing instability, housed, with risk of homelessness: Secondary | ICD-10-CM

## 2022-09-27 MED ORDER — NOVOLOG FLEXPEN 100 UNIT/ML ~~LOC~~ SOPN
5.0000 [IU] | PEN_INJECTOR | Freq: Three times a day (TID) | SUBCUTANEOUS | 11 refills | Status: DC
  Filled 2022-09-27: qty 3, 20d supply, fill #0
  Filled 2022-11-02: qty 12, 80d supply, fill #0

## 2022-09-27 MED ORDER — AMLODIPINE BESYLATE 10 MG PO TABS
10.0000 mg | ORAL_TABLET | Freq: Every day | ORAL | 0 refills | Status: DC
  Filled 2022-09-27: qty 30, 30d supply, fill #0

## 2022-09-27 MED ORDER — INSULIN DETEMIR 100 UNIT/ML ~~LOC~~ SOLN
25.0000 [IU] | Freq: Every day | SUBCUTANEOUS | 0 refills | Status: DC
  Filled 2022-09-27: qty 10, 40d supply, fill #0

## 2022-09-27 MED ORDER — INSULIN DETEMIR 100 UNIT/ML FLEXPEN
25.0000 [IU] | Freq: Every day | SUBCUTANEOUS | 0 refills | Status: DC
  Filled 2022-09-27: qty 6, 24d supply, fill #0

## 2022-09-27 NOTE — Telephone Encounter (Signed)
I have prescribed Levemir 25u, Aspart 5U TID WC, and amlodipine '10mg'$  for this patient to the [facility], where they are discounted. He does not have insurance until at least March 1st. After he re-establishes with his insurance would ensure he is on his usual regimen. He should be bringing all of his medications to his appointment on 3/15.  Sanjuan Dame, MD Internal Medicine PGY-3 Pager: [phone]

## 2022-09-27 NOTE — Telephone Encounter (Signed)
Return call to pt who stated he has been out of all his medications x 2 weeks. Stated [employer] will not be effective until March 1. Sending to [place] also for any assistance. Please advise.

## 2022-09-27 NOTE — BH Specialist Note (Signed)
Integrated Behavioral Health via Telemedicine Visit  09/27/2022 Ricky Boyle [id number]  Number of [place] Clinician visits: No data recorded Session Start time: 1400   Session End time: [id number]  Total time in minutes: 57   Referring Provider: Charise Killian Patient/Family location: Home [facility] Provider location: [facility]  All persons participating in visit: Patient Types of Service: Introduction only  I connected with Ricky Boyle  via  and verified that I am speaking with the correct person using two identifiers. Discussed confidentiality: Yes   I discussed the limitations of telemedicine and the availability of in person appointments.  Discussed there is a possibility of technology failure and discussed alternative modes of communication if that failure occurs.   Patient and/or legal guardian expressed understanding and consented to Telemedicine visit: Yes   Assessment: [name] introduced self on today and provided patient with [facility] direct contact information. Patient stated he wasn't feeling well and stated he was out of medication for his diabetes. Patient requested a call back from Nurse Triage. Patient advised if he begins to feel worse, he will go to ER. [facility] sent message to Nurse Triage regarding patient.     Plan: Follow up with behavioral health clinician on : 03/04 at 2:30 video visit   I discussed the assessment and treatment plan with the patient and/or parent/guardian. They were provided an opportunity to ask questions and all were answered. They agreed with the plan and demonstrated an understanding of the instructions.   They were advised to call back or seek an in-person evaluation if the symptoms worsen or if the condition fails to improve as anticipated.  [name], MSW, [facility] Direct Dial:[phone]  Fax [phone] Main Office Phone: [phone]  [place], [place] [address] Website: [place], [place]

## 2022-09-27 NOTE — Telephone Encounter (Signed)
-----   Message from Drema Pry sent at 09/27/2022  1:52 PM EST ----- Regarding: Out of Meds Patient stated his insurance isn't effective until March 1st. Patient is out of medication. Patient reports not feeling well and stated his last "sugar reading" was 365. Patient advised if he feels any worse he will go to the ER. Patient requesting a call back.

## 2022-09-27 NOTE — Addendum Note (Signed)
Addended bySanjuan Dame on: 09/27/2022 03:29 PM   Modules accepted: Orders

## 2022-09-27 NOTE — Telephone Encounter (Addendum)
Pt was called and informed rx for meds (Levemir, Novolg and Amlodipine) were sent to [facility]. Pt reminded to bring all his meds to his appt on 3/15.

## 2022-09-28 ENCOUNTER — Institutional Professional Consult (permissible substitution): Payer: Self-pay | Admitting: Licensed Clinical Social Worker

## 2022-09-28 ENCOUNTER — Other Ambulatory Visit: Payer: Medicaid Other | Admitting: Pharmacist

## 2022-09-28 NOTE — Progress Notes (Signed)
09/28/2022 Name: Ricky Boyle MRN: [id number] DOB: 01/26/1961  Chief Complaint  Patient presents with   Diabetes   Hypertension   Medication Assistance    Ricky Boyle is a 62 y.o. year old male who presented for a telephone visit.   They were referred to the pharmacist by their PCP for assistance in managing diabetes, hypertension, and medication access.   Patient is participating in a Managed Medicaid Plan:  Yes  Patient reports the following about insurance and current medication supplies: Medicaid approved, waiting on a card [employer] of [place] via Obamacare His previous insurance ended 08/01/22, and was able to fill a last round of medications on 08/13/22.  Amlodipine-olmesartan - 20 days left Does not have supplies for glucometer ->  [employer] insurance kicks in on Friday, but has to meet deductible $2400 Wellbutrin - does not have Novolog - does not have Tresiba 25 units at bedtime, lost 115lb   Eats 3-4-5 times, since starting that medicine Levemir 25 units day - does not have     Subjective:  Care Team: Primary Care Provider: [name], MD ; Next Scheduled Visit: 10/04/22  Medication Access/Adherence  Current Pharmacy:  [facility] [phone] - [address], [place] [address] Phone: [phone] Fax: [phone]  [id number] - [place], [address] AT [place] [facility] [address] Phone: [phone] Fax: [phone]  [facility] [address] [place] [address] Phone: [phone] Fax: [phone]   Patient reports affordability concerns with their medications: Yes  Patient reports access/transportation concerns to their pharmacy: Yes  Patient reports adherence concerns with their medications:  Yes     Diabetes:  Current medications: tresiba  25 units daily (recent RX for levemir, novolog TID, farxiga - not yet picked up)   Current glucose readings: not currently checking  Patient denies hypoglycemic s/sx including dizziness, shakiness, sweating. Patient reports hyperglycemic symptoms including polyuria, polydipsia, polyphagia, nocturia, neuropathy, blurred vision.   Current meal patterns: sheetz, hot dogs, patient describes challenge with current circumstances.   Hypertension:  Current medications: amlodipine-olmesartan 10-'40mg'$  daily   Patient does not have a validated, automated, upper arm home BP cuff Current blood pressure readings readings: not currently checking  Patient denies hypotensive s/sx including dizziness, lightheadedness.  Patient denies hypertensive symptoms including headache, chest pain, shortness of breath  Medication Management:  Current adherence strategy: taking medications as prescribed, has not run out.  Patient reports Good adherence to medications  Patient reports the following barriers to adherence: patient is in-between insurances     Objective:  Lab Results  Component Value Date   HGBA1C >14.0 (A) 09/15/2022    Lab Results  Component Value Date   CREATININE 1.06 09/15/2022   BUN 16 09/15/2022   NA 137 09/15/2022   K 4.2 09/15/2022   CL 97 09/15/2022   CO2 21 09/15/2022    Lab Results  Component Value Date   CHOL 149 04/20/2020   HDL 17 (L) 04/20/2020   LDLCALC 87 04/20/2020   TRIG 224 (H) 04/20/2020   CHOLHDL 8.8 04/20/2020    Medications Reviewed Today     Reviewed by Darius Bump, [place] (Pharmacist) on 09/28/22 at [place] List Status: <None>   Medication Order Taking? Sig Documenting Provider Last Dose Status Informant  Accu-Chek Softclix Lancets lancets [id number] No Use  as instructed  Patient not taking: Reported on 09/28/2022   Riesa Pope, MD Not Taking Active   amLODipine (NORVASC) 10 MG tablet [id number] No Take 1 tablet (10 mg total) by mouth  daily. Sanjuan Dame, MD Unknown Active   amLODipine-olmesartan (AZOR) 10-40 MG tablet [id number] Yes Take 1 tablet by mouth daily. [name], MD Taking Active   Blood Glucose Monitoring Suppl (ACCU-CHEK GUIDE ME) w/Device KIT [id number] No 1 kit by Does not apply route 4 (four) times daily.  Patient not taking: Reported on 09/28/2022   Riesa Pope, MD Not Taking Active   buPROPion (WELLBUTRIN XL) 150 MG 24 hr tablet [id number] No Take 1 tablet (150 mg total) by mouth daily.  Patient not taking: Reported on 09/28/2022   Riesa Pope, MD Not Taking Active   dapagliflozin propanediol (FARXIGA) 5 MG TABS tablet [id number] No Take 1 tablet (5 mg total) by mouth daily before breakfast.  Patient not taking: Reported on 09/28/2022   Riesa Pope, MD Not Taking Active   insulin aspart (NOVOLOG FLEXPEN) 100 UNIT/ML FlexPen [id number] No Inject 5 Units into the skin 3 (three) times daily with meals.  Patient not taking: Reported on 09/28/2022   Sanjuan Dame, MD Not Taking Active   insulin degludec (TRESIBA) 100 UNIT/ML FlexTouch Pen [id number] Yes Inject 25 Units into the skin at bedtime. Riesa Pope, MD Taking Active   insulin detemir (LEVEMIR) 100 unit/ml SOLN [id number] No Inject 25 Units into the skin daily.  Patient not taking: Reported on 09/28/2022   Sanjuan Dame, MD Not Taking Active               Assessment/Plan:   Diabetes: - Currently uncontrolled - Reviewed long term cardiovascular and renal outcomes of uncontrolled blood sugar - Reviewed goal A1c, goal fasting, and goal 2 hour post prandial glucose - Reviewed dietary modifications including access to lean proteins, fruit & vegetables - Reviewed lifestyle modifications including: - Recommend to continue current medications, pick up novolog and farxiga at pharmacy.  - Recommend to check glucose when obtains insurance  Hypertension: - Currently uncontrolled - Reviewed long term  cardiovascular and renal outcomes of uncontrolled blood pressure - Reviewed appropriate blood pressure monitoring technique and reviewed goal blood pressure. - Recommend to continue current medications, will prioritize glucose control for right now and after insurance changes take effect, will then work on optimizing hypertension medication.   Medication Management: - Currently strategy sufficient to maintain appropriate adherence to prescribed medication regimen - Recommend patient pick up meds at pharmacy when insurance kicks in, counseled on new medications    Follow Up Plan: 2 weeks  Larinda Buttery, PharmD Clinical Pharmacist [facility] [phone]

## 2022-09-28 NOTE — Patient Instructions (Addendum)
Bra[place],  Thank you for speaking with me today. As discussed, here are our main goals:  - Get your medicines from the pharmacy when your insurance kicks in - Get the supplies to check your blood sugar when your insurance kicks in - Continue taking tresiba (do not take levemir at the same time as tresiba)  We will work together on getting blood sugar lowered, so you are having less bothersome symptoms. Next, we will work on getting blood pressure lowered - no changes to that medicine for now.  We will touch base in 2 weeks, but message me sooner if you run into any issues!  Take care, Luana Shu, PharmD Clinical Pharmacist [facility]

## 2022-09-29 ENCOUNTER — Encounter: Payer: Self-pay | Admitting: *Deleted

## 2022-09-29 ENCOUNTER — Other Ambulatory Visit: Payer: Medicaid Other | Admitting: *Deleted

## 2022-09-29 NOTE — Patient Instructions (Signed)
Visit Information  Ricky Boyle was given information about Medicaid Managed Care team care coordination services and verbally consented to engagement with the [facility] Managed Care team.   Ricky Boyle,   Please see education materials related to diabetes provided by MyChart link.  Patient verbalizes understanding of instructions and care plan provided today and agrees to view in [place]. Active MyChart status and patient understanding of how to access instructions and care plan via MyChart confirmed with patient.     Telephone follow up appointment with Managed Medicaid care management team member scheduled for:10/29/22 @ [name] RN, [place] RN Care Coordinator    Current Barriers:  Chronic Disease Management support and education needs related to [name]. [place] is currently in between housing. He will move in with his youngest daughter in 3 weeks. Currently he receives food stamps and receives help from his other daughter. He has not picked up recently prescribed medications due to no insurance and no money. Reports he will have insurance on 10/01/22 and will pick up medications. 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 He is eager to start working to improve his BS readings and would like to get his A1C down to around 5%.  RNCM Clinical Goal(s):  Patient will verbalize understanding of plan for management of DM as evidenced by patient reports take all medications exactly as prescribed and will call provider for medication related questions as evidenced by patient reports and EMR documentation    attend all scheduled medical appointments: 10/04/22 with Ricky Boyle and 10/15/22 with Ricky Boyle as evidenced by provider documentation        work with Education officer, museum to address needing vision and dental providers related to the management of DM as evidenced by review of EMR and patient or Education officer, museum report     through  collaboration with Consulting civil engineer, provider, and care team.   Interventions: Inter-disciplinary care team collaboration (see longitudinal plan of care) Evaluation of current treatment plan related to  self management and patient's adherence to plan as established by provider   Diabetes:  (Status: New goal.) Long Term Goal   Lab Results  Component Value Date   HGBA1C >14.0 (A) 09/15/2022   @ Assessed patient's understanding of A1c goal: <7% Provided education to patient about basic DM disease process; Reviewed medications with patient and discussed importance of medication adherence;        Reviewed prescribed diet with patient including increasing vegetables, fruits and lean protein; Counseled on importance of regular laboratory monitoring as prescribed;        Discussed plans with patient for ongoing care management follow up and provided patient with direct contact information for care management team;      Provided patient with written educational materials related to hypo and hyperglycemia and importance of correct treatment;       Reviewed scheduled/upcoming provider appointments including: 10/04/22 with Ricky Boyle and 10/15/22 with Ricky Boyle;         Referral made to social work team for assistance with needing dental and vision providers;      Review of patient status, including review of consultants reports, relevant laboratory and other test results, and medications completed;       Assessed social determinant of health barriers;        Provided patient education on food choices while eating out Stressed the importance of patient picking up his medications on 10/01/22  and take as instructed(he will have insurance) Advised patient to take all medications and glucometer with him to Ricky Boyle appointment on 10/15/22  Patient Goals/Self-Care Activities: Take medications as prescribed   Attend all scheduled provider appointments Call provider office for new concerns or questions  Work with the social  worker to address care coordination needs and will continue to work with the clinical team to address health care and disease management related needs schedule appointment with eye doctor check feet daily for cuts, sores or redness drink 6 to 8 glasses of water each day fill half of plate with vegetables

## 2022-09-29 NOTE — Patient Outreach (Signed)
Medicaid Managed Care   Nurse Care Manager Note  09/29/2022 Name:  Ricky Boyle MRN:  [id number] DOB:  Jan 31, 1961  Ricky Boyle is an 62 y.o. year old male who is a primary patient of Ricky Natal, MD.  The [facility] Managed Care Coordination team was consulted for assistance with:    DM  [name] was given information about Medicaid Managed Care Coordination team services today. Ricky Boyle Patient agreed to services and verbal consent obtained.  Engaged with patient by telephone for initial visit in response to provider referral for case management and/or care coordination services.   Assessments/Interventions:  Review of past medical history, allergies, medications, health status, including review of consultants reports, laboratory and other test data, was performed as part of comprehensive evaluation and provision of chronic care management services.  SDOH (Social Determinants of Health) assessments and interventions performed: SDOH Interventions    Flowsheet Row Patient Outreach Telephone from 09/29/2022 in [place] Coordination from 09/23/2022 in [place] Coordination Telephone from 09/14/2022 in [place]  SDOH Interventions     Food Insecurity Interventions Intervention Not Indicated Intervention Not Indicated Intervention Not Indicated  Housing Interventions -- Other (Comment)  [employer] Homelessness Prevention Program] Other (Comment)  [Needs referral to SW]  Transportation Interventions Intervention Not Indicated -- --  Utilities Interventions -- -- Intervention Not Indicated  Financial Strain Interventions -- Other (Comment)  [pt has applied for Disability and MM,  consult with MM team] --       Care Plan  Allergies  Allergen Reactions   Bee Venom Anaphylaxis   Shrimp [Shellfish Allergy] Anaphylaxis   Penicillins Other (See Comments)    Pt does not  know what the reaction was    Medications Reviewed Today     Reviewed by Ricky Montane, RN (Registered Nurse) on 09/29/22 at [place] List Status: <None>   Medication Order Taking? Sig Documenting Provider Last Dose Status Informant  Accu-Chek Softclix Lancets lancets [id number] No Use as instructed  Patient not taking: Reported on 09/28/2022   Ricky Pope, MD Not Taking Active   amLODipine (NORVASC) 10 MG tablet [id number] No Take 1 tablet (10 mg total) by mouth daily.  Patient not taking: Reported on 09/29/2022   Ricky Dame, MD Not Taking Active   amLODipine-olmesartan (AZOR) 10-40 MG tablet [id number] Yes Take 1 tablet by mouth daily. Ricky Natal, MD Taking Active   Blood Glucose Monitoring Suppl (ACCU-CHEK GUIDE ME) w/Device KIT [id number] No 1 kit by Does not apply route 4 (four) times daily.  Patient not taking: Reported on 09/28/2022   Ricky Pope, MD Not Taking Active   buPROPion (WELLBUTRIN XL) 150 MG 24 hr tablet [id number] No Take 1 tablet (150 mg total) by mouth daily.  Patient not taking: Reported on 09/28/2022   Ricky Pope, MD Not Taking Active   dapagliflozin propanediol (FARXIGA) 5 MG TABS tablet [id number] No Take 1 tablet (5 mg total) by mouth daily before breakfast.  Patient not taking: Reported on 09/28/2022   Ricky Pope, MD Not Taking Active   insulin aspart (NOVOLOG FLEXPEN) 100 UNIT/ML FlexPen [id number] No Inject 5 Units into the skin 3 (three) times daily with meals.  Patient not taking: Reported on 09/28/2022   Ricky Dame, MD Not Taking Active   insulin degludec (TRESIBA) 100 UNIT/ML FlexTouch Pen [id number] Yes Inject 25 Units into the skin at bedtime. Ricky Pope, MD Taking Active   insulin detemir (LEVEMIR)  100 unit/ml SOLN [id number] No Inject 25 Units into the skin daily.  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[place]) 05/09/2020   Essential hypertension 04/20/2020   Pancreatitis 04/17/2020   OSA (obstructive sleep apnea) 06/16/2016    Conditions to be addressed/monitored per PCP order:   DM  Care Plan : RN Care Manager Plan of Care  Updates made by Ricky Montane, RN since 09/29/2022 12:00 AM     Problem: Health Management needs related to DM      Long-Range Goal: Development of Plan of Care to address Health Management needs related to DM   Start Date: 09/29/2022  Expected End Date: 01/27/2023  Note:   Current Barriers:  Chronic Disease Management support and education needs related to DM [name] is currently in between housing. He will move in with his youngest daughter in 3 weeks. Currently he receives food stamps and receives help from his other daughter. He has not picked up recently prescribed medications due to no insurance and no money. Reports he will have insurance on 10/01/22 and will pick up medications. 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 He is eager to start working to improve his BS readings and would like to get his A1C down to around 5%.  RNCM Clinical Goal(s):  Patient will verbalize understanding of plan for management of DM as evidenced by patient reports take all medications exactly as prescribed and will call provider for medication related questions as evidenced by patient reports and EMR documentation    attend all scheduled medical appointments: 10/04/22 with Ricky Boyle and 10/15/22 with PCP as evidenced by provider documentation        work with Education officer, museum to address needing vision and dental providers related to the management of DM as evidenced by  review of EMR and patient or Education officer, museum report     through collaboration with Consulting civil engineer, provider, and care team.   Interventions: Inter-disciplinary care team collaboration (see longitudinal plan of care) Evaluation of current treatment plan related to  self management and patient's adherence to plan as established by provider   Diabetes:  (Status: New goal.) Long Term Goal   Lab Results  Component Value Date   HGBA1C >14.0 (A) 09/15/2022   @ Assessed patient's understanding of A1c goal: <7% Provided education to patient about basic DM disease process; Reviewed medications with patient and discussed importance of medication adherence;        Reviewed prescribed diet with patient including increasing vegetables, fruits and lean protein; Counseled on importance of regular laboratory monitoring as prescribed;        Discussed plans with patient for ongoing care management follow up and provided patient with direct contact information for care management team;      Provided patient with written educational materials related to hypo and hyperglycemia and importance of correct treatment;       Reviewed scheduled/upcoming provider appointments including: 10/04/22 with Ricky Boyle and 10/15/22 with PCP;         Referral made to social work team for assistance  with needing dental and vision providers;      Review of patient status, including review of consultants reports, relevant laboratory and other test results, and medications completed;       Assessed social determinant of health barriers;        Provided patient education on food choices while eating out Stressed the importance of patient picking up his medications on 10/01/22 and take as instructed(he will have insurance) Advised patient to take all medications and glucometer with him to PCP appointment on 10/15/22  Patient Goals/Self-Care Activities: Take medications as prescribed   Attend all scheduled provider appointments Call  provider office for new concerns or questions  Work with the social worker to address care coordination needs and will continue to work with the clinical team to address health care and disease management related needs schedule appointment with eye doctor check feet daily for cuts, sores or redness drink 6 to 8 glasses of water each Ricky fill half of plate with vegetables       Follow Up:  Patient agrees to Care Plan and Follow-up.  Plan: The Managed Medicaid care management team will reach out to the patient again over the next 30 days.  Date/time of next scheduled RN care management/care coordination outreach:  10/29/22 @ [place] RN, BSN [place]  [employer] manager

## 2022-10-04 ENCOUNTER — Other Ambulatory Visit: Payer: Self-pay | Admitting: *Deleted

## 2022-10-04 ENCOUNTER — Ambulatory Visit (INDEPENDENT_AMBULATORY_CARE_PROVIDER_SITE_OTHER): Payer: Self-pay | Admitting: Licensed Clinical Social Worker

## 2022-10-04 DIAGNOSIS — I10 Essential (primary) hypertension: Secondary | ICD-10-CM

## 2022-10-04 DIAGNOSIS — F32A Depression, unspecified: Secondary | ICD-10-CM

## 2022-10-04 DIAGNOSIS — Z59811 Housing instability, housed, with risk of homelessness: Secondary | ICD-10-CM

## 2022-10-04 DIAGNOSIS — E1169 Type 2 diabetes mellitus with other specified complication: Secondary | ICD-10-CM

## 2022-10-04 NOTE — Telephone Encounter (Signed)
Talked to pt - stated he has insurance now but his deductible is so high, $8000.00. and he has to pay >$900.00 per month  for the insurance. He wants rxs sent to [place] if possible. He already has [place]. Stated the plain Amlodipine makes his legs swell.

## 2022-10-04 NOTE — Telephone Encounter (Signed)
-----   Message from Drema Pry sent at 10/04/2022  2:55 PM EST ----- Patient requesting a call. Patient unsure of what medications he needs to take. Patient advised he could only afford to pick up [place]. All other medications/ Meters patient can not afford and is not taking. Patient couldn't clarify which medications.   Patient reports feelings irritable, tired and depressed. Patient reports weight loss due to lack of nutritional meals. Patient expected to move in with daughter next week . Patient is optimistic his nutrition will improve once with is daughter.

## 2022-10-05 ENCOUNTER — Other Ambulatory Visit: Payer: Self-pay | Admitting: Student

## 2022-10-05 ENCOUNTER — Other Ambulatory Visit: Payer: Medicaid Other

## 2022-10-05 ENCOUNTER — Other Ambulatory Visit (HOSPITAL_COMMUNITY): Payer: Self-pay

## 2022-10-05 MED ORDER — BUPROPION HCL ER (XL) 150 MG PO TB24
150.0000 mg | ORAL_TABLET | Freq: Every day | ORAL | 1 refills | Status: DC
  Filled 2022-10-05 – 2022-11-02 (×2): qty 90, 90d supply, fill #0
  Filled 2023-04-23 – 2023-06-03 (×3): qty 90, 90d supply, fill #1

## 2022-10-05 MED ORDER — ONETOUCH VERIO W/DEVICE KIT
1.0000 | PACK | Freq: Every day | 1 refills | Status: DC
  Filled 2022-10-05: qty 1, 30d supply, fill #0

## 2022-10-05 MED ORDER — ACCU-CHEK GUIDE ME W/DEVICE KIT
1.0000 | PACK | Freq: Four times a day (QID) | 1 refills | Status: DC
  Filled 2022-10-05: qty 1, 30d supply, fill #0

## 2022-10-05 MED ORDER — ONETOUCH DELICA PLUS LANCET33G MISC
3 refills | Status: DC
  Filled 2022-10-05: qty 300, 75d supply, fill #0

## 2022-10-05 MED ORDER — FREESTYLE LITE W/DEVICE KIT
1.0000 | PACK | Freq: Every day | 1 refills | Status: DC
  Filled 2022-10-05: qty 1, 30d supply, fill #0

## 2022-10-05 MED ORDER — DAPAGLIFLOZIN PROPANEDIOL 5 MG PO TABS
5.0000 mg | ORAL_TABLET | Freq: Every day | ORAL | 1 refills | Status: DC
  Filled 2022-10-05 (×2): qty 30, 30d supply, fill #0
  Filled 2022-11-02: qty 90, 90d supply, fill #0

## 2022-10-05 MED ORDER — ONETOUCH VERIO VI STRP
ORAL_STRIP | 12 refills | Status: DC
  Filled 2022-10-05: qty 300, 90d supply, fill #0

## 2022-10-05 MED ORDER — AMLODIPINE-OLMESARTAN 10-40 MG PO TABS
1.0000 | ORAL_TABLET | Freq: Every day | ORAL | 3 refills | Status: DC
  Filled 2022-10-05 – 2022-11-02 (×2): qty 90, 90d supply, fill #0

## 2022-10-05 MED ORDER — AMLODIPINE BESYLATE 10 MG PO TABS: 10.0000 mg | ORAL_TABLET | Freq: Every day | ORAL | 0 refills | Status: DC

## 2022-10-05 MED ORDER — TRUEPLUS LANCETS 30G MISC
12 refills | Status: DC
  Filled 2022-10-05: qty 100, 25d supply, fill #0

## 2022-10-05 NOTE — Patient Outreach (Signed)
Medicaid Managed Care Social Work Note  10/05/2022 Name:  Ricky Boyle MRN:  [id number] DOB:  1960/11/14  Ricky Boyle is an 62 y.o. year old male who is a primary patient of [name], MD.  The [facility] Managed Care Coordination team was consulted for assistance with:   dental and vision resources  Ricky Boyle was given information about Medicaid Managed Care Coordination team services today. Ricky Boyle Patient agreed to services and verbal consent obtained.  Engaged with patient  for by telephone forinitial visit in response to referral for case management and/or care coordination services.   Assessments/Interventions:  Review of past medical history, allergies, medications, health status, including review of consultants reports, laboratory and other test data, was performed as part of comprehensive evaluation and provision of chronic care management services.  SDOH: (Social Determinant of Health) assessments and interventions performed: SDOH Interventions    Flowsheet Row Patient Outreach Telephone from 09/29/2022 in [place] Coordination from 09/23/2022 in [place] Telephone from 09/14/2022 in [place]  SDOH Interventions     Food Insecurity Interventions Intervention Not Indicated Intervention Not Indicated Intervention Not Indicated  Housing Interventions -- Other (Comment)  [[name]] Other (Comment)  [Needs referral to SW]  Transportation Interventions Intervention Not Indicated -- --  Utilities Interventions -- -- Intervention Not Indicated  Financial Strain Interventions -- Other (Comment)  [pt has applied for Disability and MM,  consult with MM team] --      BSW completed a telephone outreach with patient, he stated that he only needed vision and dental resources. Patient stated that he does have a MM plan but does  not know which one. BSW will email patient dental and vision resources. No other resources are needed at this time. Advanced Directives Status:  Not addressed in this encounter.  Care Plan                 Allergies  Allergen Reactions   Bee Venom Anaphylaxis   Shrimp [Shellfish Allergy] Anaphylaxis   Penicillins Other (See Comments)    Pt does not know what the reaction was    Medications Reviewed Today     Reviewed by Melissa Montane, RN (Registered Nurse) on 09/29/22 at [place] List Status: <None>   Medication Order Taking? Sig Documenting Provider Last Dose Status Informant  Accu-Chek Softclix Lancets lancets [id number] No Use as instructed  Patient not taking: Reported on 09/28/2022   Riesa Pope, MD Not Taking Active   amLODipine (NORVASC) 10 MG tablet [id number] No Take 1 tablet (10 mg total) by mouth daily.  Patient not taking: Reported on 09/29/2022   Sanjuan Dame, MD Not Taking Active   amLODipine-olmesartan (AZOR) 10-40 MG tablet [id number] Yes Take 1 tablet by mouth daily. [name], MD Taking Active   Blood Glucose Monitoring Suppl (ACCU-CHEK GUIDE ME) w/Device KIT [id number] No 1 kit by Does not apply route 4 (four) times daily.  Patient not taking: Reported on 09/28/2022   Riesa Pope, MD Not Taking Active   buPROPion (WELLBUTRIN XL) 150 MG 24 hr tablet [id number] No Take 1 tablet (150 mg total) by mouth daily.  Patient not taking: Reported on 09/28/2022   Riesa Pope, MD Not Taking Active   dapagliflozin propanediol (FARXIGA) 5 MG TABS tablet [id number] No Take 1 tablet (5 mg total) by mouth daily before breakfast.  Patient not taking: Reported on 09/28/2022   Katsadouros, Vasilios,  MD Not Taking Active   insulin aspart (NOVOLOG FLEXPEN) 100 UNIT/ML FlexPen [id number] No Inject 5 Units into the skin 3 (three) times daily with meals.  Patient not taking: Reported on 09/28/2022   Sanjuan Dame, MD Not Taking Active   insulin degludec  (TRESIBA) 100 UNIT/ML FlexTouch Pen [id number] Yes Inject 25 Units into the skin at bedtime. Riesa Pope, MD Taking Active   insulin detemir (LEVEMIR) 100 unit/ml SOLN [id number] No Inject 25 Units into the skin daily.  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[place]) 05/09/2020   Essential hypertension 04/20/2020   Pancreatitis 04/17/2020   OSA (obstructive sleep apnea) 06/16/2016    Conditions to be addressed/monitored per PCP order:   dental and vision resources   Care Plan : RN Care Manager Plan of Care  Updates made by Ethelda Chick since 10/05/2022 12:00 AM     Problem: Health Management needs related to DM      Long-Range Goal: Development of Plan of Care to address Health Management needs related to DM   Start Date: 09/29/2022  Expected End Date: 01/27/2023  Note:   Current Barriers:  Chronic Disease Management support and education needs related to DM Ricky Boyle is currently in between housing. He will move in with his youngest daughter in 3 weeks. Currently he receives food stamps and receives help from his other daughter. He has not picked up recently prescribed medications due to no insurance and no money. Reports he will have insurance on 10/01/22 and will pick up medications. 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 He is eager to start working to improve his BS readings and would like to get his A1C down to around 5%.   RNCM Clinical Goal(s):  Patient will verbalize understanding of plan for management of DM as evidenced by patient reports take all medications  exactly as prescribed and will call provider for medication related questions as evidenced by patient reports and EMR documentation    attend all scheduled medical appointments: 10/04/22 with [name] and 10/15/22 with PCP as evidenced by provider documentation        work with Education officer, museum to address needing vision and dental providers related to the management of DM as evidenced by review of EMR and patient or Education officer, museum report     through collaboration with Consulting civil engineer, provider, and care team.   Interventions: Inter-disciplinary care team collaboration (see longitudinal plan of care) Evaluation of current treatment plan related to  self management and patient's adherence to plan as established by provider BSW completed a telephone outreach with patient, he stated that he only needed vision and dental resources. Patient stated that he does have a MM plan but does not know which one. BSW will email patient dental and vision resources. No other resources are needed at this time.   Diabetes:  (Status: New goal.) Long Term Goal   Lab Results  Component Value Date   HGBA1C >14.0 (A) 09/15/2022   @ Assessed patient's understanding of A1c goal: <7% Provided education to patient about basic DM  disease process; Reviewed medications with patient and discussed importance of medication adherence;        Reviewed prescribed diet with patient including increasing vegetables, fruits and lean protein; Counseled on importance of regular laboratory monitoring as prescribed;        Discussed plans with patient for ongoing care management follow up and provided patient with direct contact information for care management team;      Provided patient with written educational materials related to hypo and hyperglycemia and importance of correct treatment;       Reviewed scheduled/upcoming provider appointments including: 10/04/22 with [name] and 10/15/22 with PCP;         Referral made to social work team for  assistance with needing dental and vision providers;      Review of patient status, including review of consultants reports, relevant laboratory and other test results, and medications completed;       Assessed social determinant of health barriers;        Provided patient education on food choices while eating out Stressed the importance of patient picking up his medications on 10/01/22 and take as instructed(he will have insurance) Advised patient to take all medications and glucometer with him to PCP appointment on 10/15/22  Patient Goals/Self-Care Activities: Take medications as prescribed   Attend all scheduled provider appointments Call provider office for new concerns or questions  Work with the social worker to address care coordination needs and will continue to work with the clinical team to address health care and disease management related needs schedule appointment with eye doctor check feet daily for cuts, sores or redness drink 6 to 8 glasses of water each day fill half of plate with vegetables       Follow up:  Patient agrees to Care Plan and Follow-up.  Plan: The Managed Medicaid care management team will reach out to the patient again over the next 30 days.  Date/time of next scheduled Social Work care management/care coordination outreach:  11/05/22  Mickel Fuchs, Arita Miss, [place] Managed Medicaid Team  [phone]

## 2022-10-05 NOTE — Progress Notes (Signed)
[facility]

## 2022-10-05 NOTE — Progress Notes (Signed)
Medications sent into pharmacy. Called Ricky Boyle to discuss refills, he states he now has [employer]. Will keep prescriptions and adjust as needed. If anything may be able to extend supply to 90 days.

## 2022-10-05 NOTE — Telephone Encounter (Signed)
Does he need all his medications sent to them?

## 2022-10-05 NOTE — Patient Instructions (Signed)
Thank you for taking time to speak with me today about care coordination and care management services available to you at no cost as part of your Medicaid benefit. These services are voluntary. Our team is available to provide assistance regarding your health care needs at any time. Please do not hesitate to reach out to me if we can be of service to you at any time in the future.  Mickel Fuchs, BSW, [place] Managed Medicaid Team  [phone]

## 2022-10-05 NOTE — Telephone Encounter (Signed)
Pt stated he cannot take the plain amlodipine; he stated it has to be the combo pill.Marland Kitchen

## 2022-10-05 NOTE — Addendum Note (Signed)
Addended by: Riesa Pope on: 10/05/2022 04:57 PM   Modules accepted: Orders

## 2022-10-06 ENCOUNTER — Other Ambulatory Visit (HOSPITAL_COMMUNITY): Payer: Self-pay

## 2022-10-15 ENCOUNTER — Ambulatory Visit (INDEPENDENT_AMBULATORY_CARE_PROVIDER_SITE_OTHER): Payer: BLUE CROSS/BLUE SHIELD | Admitting: Student

## 2022-10-15 ENCOUNTER — Ambulatory Visit (HOSPITAL_COMMUNITY)
Admission: RE | Admit: 2022-10-15 | Discharge: 2022-10-15 | Disposition: A | Discharge status: Home / Self Care | Payer: BLUE CROSS/BLUE SHIELD | Source: Ambulatory Visit | Attending: Internal Medicine | Admitting: Internal Medicine

## 2022-10-15 ENCOUNTER — Encounter: Payer: Self-pay | Admitting: Student

## 2022-10-15 VITALS — BP 138/82 | HR 86 | Temp 98.3°F | Ht 67.0 in | Wt 170.5 lb

## 2022-10-15 DIAGNOSIS — K869 Disease of pancreas, unspecified: Secondary | ICD-10-CM

## 2022-10-15 DIAGNOSIS — R159 Full incontinence of feces: Secondary | ICD-10-CM

## 2022-10-15 DIAGNOSIS — E1169 Type 2 diabetes mellitus with other specified complication: Secondary | ICD-10-CM

## 2022-10-15 DIAGNOSIS — R634 Abnormal weight loss: Secondary | ICD-10-CM | POA: Insufficient documentation

## 2022-10-15 NOTE — Patient Instructions (Signed)
Thank you, [name] for allowing [place] to provide your care today. Today we discussed .  Diabetes Please use the tresiba 25 U nightly, novolog 5 U with meals, and farxiga. Please continue to check your sugars once you get your meter. Your morning sugar should be around 180. If your sugar levels are low, please call us. Low meaning under 80.   Weight loss We will be getting blood work and a chest xray today. I will call you with the lab results.   Fecal incontinence I have placed a referral to the GI doctor.     I have ordered the following labs for you:   Lab Orders         CBC with Diff         TSH         CMP14 + Anion Gap         Sed Rate (ESR)         CRP (C-Reactive Protein)       Referrals ordered today:    Referral Orders         Ambulatory referral to Gastroenterology      I have ordered the following medication/changed the following medications:   Stop the following medications: There are no discontinued medications.   Start the following medications: No orders of the defined types were placed in this encounter.    Follow up:  1 month     Should you have any questions or concerns please call the internal medicine clinic at [phone].    Ricky Boyle, D.O. [place]

## 2022-10-15 NOTE — Assessment & Plan Note (Addendum)
Assessment: 30 lb weight loss over past 3 months. He notes history of night sweats for the last few months. Unfortunately is not up to date with cancer screenings. While weight loss could be explained by his uncontrol diabetes and his night sweats could be hypoglycemic events, will start weight loss work up. Now that is insured will refer to GI for colonoscopy and for fecal incontinence, on my exam today no obvious mass with rectal exam. Differential also includes substance use, denies using cocaine recently.   Tobacco use history but only with 13 pack year, does not qualify for low dose chest CT for lung cancer screening  Plan: - cbc, cmp, esr, crp, chest xray - colon cancer screening - GI referral placed for colonoscopy/fecal incontinence

## 2022-10-15 NOTE — Progress Notes (Signed)
CC: diabetes, weight loss, fecal incontinence  HPI:  [name] is a 62 y.o. male living with a history stated below and presents today to discuss his chronic conditio of diabetes as well as two acute concerns of weight loss and fecal incontinence. Please see problem based assessment and plan for additional details.  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 90 tablet 3   Blood Glucose Monitoring Suppl (ONETOUCH VERIO) w/Device KIT Use to check blood sugar. 1 kit 1   buPROPion (WELLBUTRIN XL) 150 MG 24 hr tablet Take 1 tablet (150 mg total) by mouth daily. 90 tablet 1   dapagliflozin propanediol (FARXIGA) 5 MG TABS tablet Take 1 tablet (5 mg total) by mouth daily before breakfast. 90 tablet 1   glucose blood (ONETOUCH VERIO) test strip Use to check blood sugar four times daily as directed. 300 each 12   insulin aspart (NOVOLOG FLEXPEN) 100 UNIT/ML FlexPen Inject 5 Units into the skin 3 (three) times daily with meals. (Patient not taking: Reported on 09/28/2022) 15 mL 11   insulin degludec (TRESIBA) 100 UNIT/ML FlexTouch Pen Inject 25 Units into the skin at bedtime. 15 mL 2   Lancets (ONETOUCH DELICA PLUS 123XX123) MISC Use to check blood sugar four times daily. 300 each 3   No current facility-administered medications on file prior to visit.    Review of Systems: ROS negative except for what is noted on the assessment and  plan.  Vitals:   10/15/22 1102  BP: 138/82  Pulse: 86  Temp: 98.3 F (36.8 C)  TempSrc: Oral  SpO2: 98%  Weight: 170 lb 8 oz (77.3 kg)  Height: 5\' 7"  (1.702 m)    Physical Exam: Constitutional: well-appearing, in no acute distress HENT: normocephalic atraumatic, mucous membranes moist Eyes: conjunctiva non-erythematous Neck: supple Pulmonary/Chest: normal work of breathing on room air Abdominal: soft, non-tender, non-distended MSK: normal bulk and tone. No obvious foot wound or sores. Normal sensation Rectal: Normal external examination. Normal rectal tone and anal wink intact.  Neurological: alert & oriented x 3 Skin: warm and dry Psych: normal mood  Nursing staff present during rectal examination  Assessment & Plan:   Weight loss Assessment: 30 lb weight loss over past 3 months. He notes history of night sweats for the last few months. Unfortunately is not up to date with cancer screenings. While weight loss could be explained by his uncontrol diabetes and his night sweats could be hypoglycemic events, will start weight loss work up. Now that is insured will refer to GI for colonoscopy and for fecal incontinence, on my exam today no obvious mass with rectal exam. Differential also includes substance use, denies using cocaine recently.   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 Was prescribed novolog, tresiba and farxia. Unfortunately only able to pick up his novolog. He now has insurance and is waiting for his medciations to be delivered next week. I will provide him samples of farxiga and novolog. He is also getting a new glucometer next week. A1c due in 1-2 months.   No  foot wounds today. Does endorse periodically numbness and tingling in his feet only. Sensation intact on exam. 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 Will continue to montior  Plan: - tresiba 25 U QHS - novolog 5 U with meals - farxiga 5 mg daily - follow up   Fecal incontinence Assessment: Patient endorses fecal incontinence for the past few weeks. It occurs when he urinates as well as when he is simply walking around. He denies this happening in the past. Denies abdominal pain, nausea, vomiting or diarrhea. Presented to the ED 08/2021 for rectal pain. Denies ever having a colonoscopy done. Denies back pain or numbness in tingling in his genitals or rectum. No history of injecting substances. No history of cancer. Denies history of hemorrhoids, trauma.   Physical exam with unremarkable appearing rectum. No hemorrhoid, prolapse, or injured tissue. Normal rectal tone and anal wink. No obvious mass with digital rectal exam.  Possibly secondary to decreased anorectal sensation from uncontrolled diabetes but had sensation during my digital rectal exam. He is due for colonoscopy, will send to GI for further evaluation.   Plan: - referral placed to GI for colonoscopy and further evaluation  Patient discussed with Dr. Fanny Bien, D.O. [facility], PGY-3 Phone: [phone] Date 10/15/2022 Time 5:33 PM

## 2022-10-15 NOTE — Assessment & Plan Note (Signed)
Assessment: Patient endorses fecal incontinence for the past few weeks. It occurs when he urinates as well as when he is simply walking around. He denies this happening in the past. Denies abdominal pain, nausea, vomiting or diarrhea. Presented to the ED 08/2021 for rectal pain. Denies ever having a colonoscopy done. Denies back pain or numbness in tingling in his genitals or rectum. No history of injecting substances. No history of cancer. Denies history of hemorrhoids, trauma.   Physical exam with unremarkable appearing rectum. No hemorrhoid, prolapse, or injured tissue. Normal rectal tone and anal wink. No obvious mass with digital rectal exam.  Possibly secondary to decreased anorectal sensation from uncontrolled diabetes but had sensation during my digital rectal exam. He is due for colonoscopy, will send to GI for further evaluation.   Plan: - referral placed to GI for colonoscopy and further evaluation

## 2022-10-15 NOTE — Assessment & Plan Note (Addendum)
Assessment: Follow up today for his diabetes. Was prescribed novolog, tresiba and farxia. Unfortunately only able to pick up his novolog. He now has insurance and is waiting for his medciations to be delivered next week. I will provide him samples of farxiga and novolog. He is also getting a new glucometer next week. A1c due in 1-2 months.   No foot wounds today. Does endorse periodically numbness and tingling in his feet only. Sensation intact on exam. 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 Will continue to montior  Plan: - tresiba 25 U QHS - novolog 5 U with meals - farxiga 5 mg daily - follow up bmp, A1c in 2 months  Addendum: Found to have elevated glucose ni the 600's on BMP. Dr. Alfonse Spruce tried to reach patient over the weekend but could not get a hold of him. Hopeful this will improve with giving him novolog and farxiga samples. He is to get his glucometer this week. Will keep reaching out to see if symptomatic

## 2022-10-17 ENCOUNTER — Telehealth: Payer: Self-pay | Admitting: Student

## 2022-10-17 DIAGNOSIS — R21 Rash and other nonspecific skin eruption: Secondary | ICD-10-CM

## 2022-10-17 NOTE — Telephone Encounter (Signed)
Unable to reach patient via phone x 2.  Unable to leave a voicemail. Dr. Agustin Cree will call him in the morning.

## 2022-10-17 NOTE — Telephone Encounter (Signed)
Received a page for critical value of 622 glucose on CMP.  Patient has uncontrolled diabetes and was not able to pick up this medication due to insurance issue.  He was seen in clinic on 3/15 and was given sample of Farxiga and NovoLog.  His most recent A1c greater than 14.  I called patient but unable to reach him on the phone.  Unable to leave a voicemail.  I called patient's daughter and left a voicemail to call back.  I will try to call them later.

## 2022-10-18 DIAGNOSIS — R21 Rash and other nonspecific skin eruption: Secondary | ICD-10-CM | POA: Insufficient documentation

## 2022-10-18 LAB — CBC WITH DIFFERENTIAL/PLATELET
Basophils Absolute: 0.1 10*3/uL (ref 0.0–0.2)
Basos: 1 %
EOS (ABSOLUTE): 0.1 10*3/uL (ref 0.0–0.4)
Eos: 1 %
Hematocrit: 47.8 % (ref 37.5–51.0)
Hemoglobin: 15.7 g/dL (ref 13.0–17.7)
Immature Grans (Abs): 0 10*3/uL (ref 0.0–0.1)
Immature Granulocytes: 0 %
Lymphocytes Absolute: 2.3 10*3/uL (ref 0.7–3.1)
Lymphs: 38 %
MCH: 32.8 pg (ref 26.6–33.0)
MCHC: 32.8 g/dL (ref 31.5–35.7)
MCV: 100 fL — ABNORMAL HIGH (ref 79–97)
Monocytes Absolute: 0.5 10*3/uL (ref 0.1–0.9)
Monocytes: 8 %
Neutrophils Absolute: 3.2 10*3/uL (ref 1.4–7.0)
Neutrophils: 52 %
Platelets: 203 10*3/uL (ref 150–450)
RBC: 4.79 x10E6/uL (ref 4.14–5.80)
RDW: 11.7 % (ref 11.6–15.4)
WBC: 6.2 10*3/uL (ref 3.4–10.8)

## 2022-10-18 LAB — CMP14 + ANION GAP
ALT: 43 IU/L (ref 0–44)
AST: 24 IU/L (ref 0–40)
Albumin/Globulin Ratio: 1.6 (ref 1.2–2.2)
Albumin: 4.1 g/dL (ref 3.8–4.9)
Alkaline Phosphatase: 150 IU/L — ABNORMAL HIGH (ref 44–121)
Anion Gap: 17 mmol/L (ref 10.0–18.0)
BUN/Creatinine Ratio: 14 (ref 10–24)
BUN: 15 mg/dL (ref 8–27)
Bilirubin Total: 1.1 mg/dL (ref 0.0–1.2)
CO2: 23 mmol/L (ref 20–29)
Calcium: 9.5 mg/dL (ref 8.6–10.2)
Chloride: 94 mmol/L — ABNORMAL LOW (ref 96–106)
Creatinine, Ser: 1.04 mg/dL (ref 0.76–1.27)
Globulin, Total: 2.6 g/dL (ref 1.5–4.5)
Glucose: 622 mg/dL (ref 70–99)
Potassium: 5 mmol/L (ref 3.5–5.2)
Sodium: 134 mmol/L (ref 134–144)
Total Protein: 6.7 g/dL (ref 6.0–8.5)
eGFR: 82 mL/min/{1.73_m2} (ref >59)

## 2022-10-18 LAB — TSH: TSH: 1.33 u[IU]/mL (ref 0.450–4.500)

## 2022-10-18 LAB — SEDIMENTATION RATE: Sed Rate: 19 mm/hr (ref 0–30)

## 2022-10-18 LAB — C-REACTIVE PROTEIN: CRP: <1 mg/L (ref 0–10)

## 2022-10-18 MED ORDER — CETAPHIL MOISTURIZING EX LOTN
1.0000 | TOPICAL_LOTION | CUTANEOUS | 0 refills | Status: AC | PRN
  Filled 2022-10-18: qty 236, fill #0

## 2022-10-18 NOTE — Addendum Note (Signed)
Addended by: Riesa Pope on: 10/18/2022 06:21 PM   Modules accepted: Orders

## 2022-10-18 NOTE — Telephone Encounter (Signed)
Spoke with Ricky Boyle, he was feeling dizzy until he used the novolog provided during our clinic visit and has noticed resolution of his symptoms. He notes that his medications and glucometer will come by the end of the week. Will adjust insulin regimen based on these reads.

## 2022-10-18 NOTE — Assessment & Plan Note (Signed)
Assessment: Ricky Boyle noticed a rash on his upper extremities and back, he is unsure of how long it has been present. It is painless, non-pruritic. It has not been associated with any drainage. On exam he has many small darkened areas of his skin located on his shoulders, upper arm, and back that appear consistent with a follicular rash. Initially thought to be atopic dermatitis but the area does not itch. No lesions, vesicles, or pustules. Will try cetaphil moisturizing cream to see if any improvement.   He also notes a black circular lesion on his shin. Notes these occur periodically, heal and resolve with time. They are non-itchy. Recommended if new one occur he present to clinic for biopsy.   Plan: - cetaphil cream - follow up rash/lesion on lower extremity at follow up

## 2022-10-19 ENCOUNTER — Ambulatory Visit (INDEPENDENT_AMBULATORY_CARE_PROVIDER_SITE_OTHER): Payer: Self-pay | Admitting: Licensed Clinical Social Worker

## 2022-10-19 ENCOUNTER — Telehealth: Payer: Self-pay

## 2022-10-19 ENCOUNTER — Other Ambulatory Visit (HOSPITAL_COMMUNITY): Payer: Self-pay

## 2022-10-19 DIAGNOSIS — F32A Depression, unspecified: Secondary | ICD-10-CM

## 2022-10-19 NOTE — Progress Notes (Signed)
Patient attempted to be outreached by Darrall Dears, PharmD Candidate on 10/18/2022 to discuss hypertension. Patient's voicemail box is not set up at this time.   Joseph Art, Pharm.D. PGY-2 [facility] Pharm[facility] Resident

## 2022-10-19 NOTE — BH Specialist Note (Signed)
Integrated Behavioral Health via Telemedicine Visit  10/19/2022 Jeronimo Roll [id number]  Number of [place] Clinician visits: No data recorded Session Start time: 0930   Session End time: 1000  Total time in minutes: 30   Referring Provider: Karleen Hampshire MD Patient/Family location: Home [facility] Provider location: Office All persons participating in visit: [facility] and Patient Types of Service: Telephone visit  I connected with [name]  via  Telephone or Video Enabled Telemedicine Application  (Video is Caregility application) and verified that I am speaking with the correct person using two identifiers. Discussed confidentiality: Yes   I discussed the limitations of telemedicine and the availability of in person appointments.  Discussed there is a possibility of technology failure and discussed alternative modes of communication if that failure occurs.  I discussed that engaging in this telemedicine visit, they consent to the provision of behavioral healthcare.  Patient and/or legal guardian expressed understanding and consented to Telemedicine visit: Yes     Goals Addressed: Patient will:  Reduce symptoms of: anxiety and depression    Progress towards Goals: Achieved     10/15/2022   11:04 AM 06/24/2021   10:52 AM 03/30/2021    1:19 PM  PHQ-SADS Last 3 Score only  PHQ Adolescent Score 0 5 4    Assessment: Patient advised he now has all medications needed. Patient is receiving Food and Nutritional benefits. Patient declined moving in with daughter, but advised he is ok with decision. Patient stated he feels a lot better. Patient denied feeling depressed or anxious. PHQSAD updated on 03/15. [facility] gave patient [facility] contact information and recommended patient to outreach [facility] if needed. Patient declined needing to inform his PCP of any updates or needs. Patient has met goals and patient denied any additional needs. No further follow up at this time.   I  discussed the assessment and treatment plan with the patient and/or parent/guardian. They were provided an opportunity to ask questions and all were answered. They agreed with the plan and demonstrated an understanding of the instructions.   They were advised to call back or seek an in-person evaluation if the symptoms worsen or if the condition fails to improve as anticipated.  [name], MSW, [place]  [facility] Direct [phone]  Fax [phone] Main Office Phone: [phone] [address] [place], [place] [address] Website: [place], [place]

## 2022-10-20 NOTE — Progress Notes (Signed)
Internal Medicine Clinic Attending  Case discussed with Dr. Katsadouros at the time of the visit.  We reviewed the resident's history and exam and pertinent patient test results.  I agree with the assessment, diagnosis, and plan of care documented in the resident's note.  

## 2022-10-20 NOTE — Addendum Note (Signed)
Addended by: Gilles Chiquito B on: 10/20/2022 10:48 AM   Modules accepted: Level of Service

## 2022-10-20 NOTE — BH Specialist Note (Signed)
Integrated Behavioral Health via Telemedicine Visit  10/04/2022 Camar Everingham [id number]  Number of [place] Clinician visits: No data recorded Session Start time: 0930   Session End time: 1000  Total time in minutes: 30   Referring Provider: Michae Kava Patient/Family location: Home [facility] Provider location: Office All persons participating in visit: [facility] and [place] Types of Service: Individual psychotherapy and Telephone visit  I connected with [name] via  Telephone or Video Enabled Telemedicine Application  (Video is Caregility application) and verified that I am speaking with the correct person using two identifiers. Discussed confidentiality: Yes   I discussed the limitations of telemedicine and the availability of in person appointments.  Discussed there is a possibility of technology failure and discussed alternative modes of communication if that failure occurs.  I discussed that engaging in this telemedicine visit, they consent to the provision of behavioral healthcare.  Patient and/or legal guardian expressed understanding and consented to Telemedicine visit: Yes   Presenting Concerns: Patient and/or family reports the following symptoms/concerns: Unable to obtain medications.  Duration of problem: 1 month; Severity of problem: moderate    Goals Addressed: Patient will:  Reduce symptoms of: depression   Progress towards Goals: Ongoing  Interventions: Interventions utilized:  Solution-Focused Strategies, Supportive Counseling, and Link to [employer] Standardized Assessments completed: PHQ-SADS     10/15/2022   11:04 AM 06/24/2021   10:52 AM 03/30/2021    1:19 PM  PHQ-SADS Last 3 Score only  PHQ Adolescent Score 0 5 4      Assessment: Patient currently experiencing inability to afford medications. [facility] will continue to support and assist. Patient advised he will be moving in with daughter for additional support.  Patient optimistic his nutrition will improve. Patient feeling depressed due to housing and medication issue. [facility] discussed options and will seek assistance from pharmacist.   Patient may benefit from Solution-Focused Strategies, Supportive Counseling, and Link to [employer]: Follow up with behavioral health clinician on : within the next 30 days.  I discussed the assessment and treatment plan with the patient and/or parent/guardian. They were provided an opportunity to ask questions and all were answered. They agreed with the plan and demonstrated an understanding of the instructions.   They were advised to call back or seek an in-person evaluation if the symptoms worsen or if the condition fails to improve as anticipated. [name], MSW, [place]  [facility] Direct Dial:[phone]  Fax [phone] Main Office Phone: [phone] [address] [place], [place] [address] Website: [place], [place]

## 2022-10-21 NOTE — Telephone Encounter (Signed)
Patient attempted to be outreached by Estelle June, PharmD Candidate on 10/21/22 to discuss hypertension. Patient's voice mailbox was full.   Estelle June PharmD Candidate 2026  [place] of Pharmacy   Maryan Puls, PharmD PGY-1 [facility] Pharmacy Resident

## 2022-10-25 ENCOUNTER — Other Ambulatory Visit (HOSPITAL_COMMUNITY): Payer: Self-pay

## 2022-10-26 NOTE — Progress Notes (Signed)
Patient outreached by Georga Kaufmann, PharmD Candidate on 10/26/22 to discuss hypertension    Patient does not have an automated home blood pressure machine. Educated patient on home BP monitors and he stated he would obtain a device and begin monitoring BP at home.    Medication review was performed. They are taking medications as prescribed.   The following barriers to adherence were noted:  - They do not have cost concerns.  - They do not have transportation concerns.  - They do not need assistance obtaining refills.  - They do not occasionally forget to take some of their prescribed medications.  - They do not feel like one/some of their medications make them feel poorly.  - They do not have questions or concerns about their medications.  - They do have follow up scheduled with their primary care provider/cardiologist.   The following interventions were completed:  - Medications were reviewed  - Patient was educated on goal blood pressures and long term health implications of elevated blood pressure  - Patient was educated on medications, including indication and administration  - Patient was educated on how to access home blood pressure machine  - Patient was counseled on lifestyle modifications to improve blood pressure, including DASH diet, sodium restriction, and physical activity. Patient stated he exercises regularly but has difficulty adhering to a healthy diet due to current living situation (eats out frequently).   The patient has follow up scheduled: 11/02/22  PCP: Dr. Eyvonne Mechanic, PharmD Candidate      Maryan Puls, PharmD PGY-1 [facility] Pharmacy Resident

## 2022-10-29 ENCOUNTER — Other Ambulatory Visit: Payer: Medicaid Other | Admitting: *Deleted

## 2022-10-29 ENCOUNTER — Encounter: Payer: Self-pay | Admitting: *Deleted

## 2022-10-29 NOTE — Patient Outreach (Signed)
Medicaid Managed Care   Nurse Care Manager Note  10/29/2022 Name:  Ricky Boyle MRN:  [id number] DOB:  December 31, 1960  Ricky Boyle is an 62 y.o. year old male who is a primary patient of [name], MD.  The [facility] Managed Care Coordination team was consulted for assistance with:    DM  [name] was given information about Medicaid Managed Care Coordination team services today. [name] Patient agreed to services and verbal consent obtained.  Engaged with patient by telephone for follow up visit in response to provider referral for case management and/or care coordination services.   Assessments/Interventions:  Review of past medical history, allergies, medications, health status, including review of consultants reports, laboratory and other test data, was performed as part of comprehensive evaluation and provision of chronic care management services.  SDOH (Social Determinants of Health) assessments and interventions performed: SDOH Interventions    Flowsheet Row Patient Outreach Telephone from 09/29/2022 in [place] Coordination from 09/23/2022 in [place] Coordination Telephone from 09/14/2022 in [place]  SDOH Interventions     Food Insecurity Interventions Intervention Not Indicated Intervention Not Indicated Intervention Not Indicated  Housing Interventions -- Other (Comment)  [employer] Homelessness Prevention Program] Other (Comment)  [Needs referral to SW]  Transportation Interventions Intervention Not Indicated -- --  Utilities Interventions -- -- Intervention Not Indicated  Financial Strain Interventions -- Other (Comment)  [pt has applied for Disability and MM,  consult with MM team] --       Care Plan  Allergies  Allergen Reactions   Bee Venom Anaphylaxis   Shrimp [Shellfish Allergy] Anaphylaxis   Penicillins Other (See Comments)    Pt does not  know what the reaction was    Medications Reviewed Today     Reviewed by Melissa Montane, RN (Registered Nurse) on 10/29/22 at [place] List Status: <None>   Medication Order Taking? Sig Documenting Provider Last Dose Status Informant  amLODipine-olmesartan (AZOR) 10-40 MG tablet [id number] Yes Take 1 tablet by mouth daily. Riesa Pope, MD Taking Active   Blood Glucose Monitoring Suppl [facility] VERIO) w/Device KIT [id number] Yes Use to check blood sugar. Riesa Pope, MD Taking Active   buPROPion (WELLBUTRIN XL) 150 MG 24 hr tablet [id number] Yes Take 1 tablet (150 mg total) by mouth daily. Riesa Pope, MD Taking Active   cetaphil (CETAPHIL) lotion [id number] No Apply 1 Application topically as needed for dry skin.  Patient not taking: Reported on 10/29/2022   Riesa Pope, MD Not Taking Active   dapagliflozin propanediol (FARXIGA) 5 MG TABS tablet [id number] No Take 1 tablet (5 mg total) by mouth daily before breakfast.  Patient not taking: Reported on 10/29/2022   Riesa Pope, MD Not Taking Active   glucose blood (ONETOUCH VERIO) test strip [id number] Yes Use to check blood sugar four times daily as directed. Riesa Pope, MD Taking Active   insulin aspart (NOVOLOG FLEXPEN) 100 UNIT/ML FlexPen [id number] Yes Inject 5 Units into the skin 3 (three) times daily with meals. Sanjuan Dame, MD Taking Active            Med Note (Jonas Goh A   Fri Oct 29, 2022  9:07 AM) Taking 10 units with each meal  insulin degludec (TRESIBA) 100 UNIT/ML FlexTouch Pen [id number] Yes Inject 25 Units into the skin at bedtime. Riesa Pope, MD Taking Active   Lancets (ONETOUCH DELICA PLUS [id number]) [place] [id number] Yes Use to check blood  sugar four times daily. 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 is working to improve his health. He has prescribed medications and is using them. His BS continues to be high, but improving. He hopes to start working at [employer] and is applying for housing assistance with [employer].  RNCM Clinical Goal(s):  Patient will verbalize understanding of plan for management of DM as evidenced by patient reports take all medications exactly as prescribed and will call provider for medication related questions as evidenced by patient reports and EMR documentation    attend all scheduled medical appointments: 11/02/22 with PCP as evidenced by provider documentation        work with Education officer, museum to address needing vision and dental providers related to the management of DM as evidenced by review of EMR and patient or Education officer, museum report     through collaboration with Consulting civil engineer, provider, and care team.   Interventions: Inter-disciplinary care team collaboration (see longitudinal plan of care) Evaluation of current treatment plan related to  self management and patient's adherence to plan as  established by provider   Diabetes:  (Status: Goal on Track (progressing): YES.) Long Term Goal   Lab Results  Component Value Date   HGBA1C >14.0 (A) 09/15/2022   @ Assessed patient's understanding of A1c goal: <7% Provided education to patient about basic DM disease process; Reviewed medications with patient and discussed importance of medication adherence;        Reviewed prescribed diet with patient including increasing vegetables, fruits and lean protein; Counseled on importance of regular laboratory monitoring as prescribed;        Reviewed scheduled/upcoming provider appointments including: 11/02/22 with PCP;         Review of patient status, including review of consultants reports, relevant laboratory and other test results, and medications completed;       Assessed social determinant of health barriers;        Discussed CGM and Nutrition consult-patient is interested in both Collaborate with PCP for CGM and Nutrition consult Advised patient to take all medications and glucometer with him to PCP appointment on 11/02/22  Patient Goals/Self-Care Activities: Take medications as prescribed   Attend all scheduled provider appointments Call provider office for new concerns or questions  Work with the social worker to address care coordination needs and will continue to work with the clinical team to address health care and  disease management related needs schedule appointment with eye doctor check feet daily for cuts, sores or redness drink 6 to 8 glasses of water each day fill half of plate with vegetables       Follow Up:  Patient agrees to Care Plan and Follow-up.  Plan: The Managed Medicaid care management team will reach out to the patient again over the next 30 days.  Date/time of next scheduled RN care management/care coordination outreach:  12/02/22 @ [place] RN, [facility] RN Care Coordinator [phone]

## 2022-10-29 NOTE — Patient Instructions (Signed)
Visit Information  [name] was given information about Medicaid Managed Care team care coordination services and verbally consented to engagement with the [facility] Managed Care team.   [name],   Please see education materials related to diabetes provided by MyChart link.  Patient verbalizes understanding of instructions and care plan provided today and agrees to view in [place]. Active MyChart status and patient understanding of how to access instructions and care plan via MyChart confirmed with patient.     Telephone follow up appointment with Managed Medicaid care management team member scheduled for:12/02/22 @ [place] RN, [facility] RN Care Coordinator [phone]

## 2022-11-01 DIAGNOSIS — Z419 Encounter for procedure for purposes other than remedying health state, unspecified: Secondary | ICD-10-CM | POA: Diagnosis not present

## 2022-11-02 ENCOUNTER — Ambulatory Visit (INDEPENDENT_AMBULATORY_CARE_PROVIDER_SITE_OTHER): Payer: BLUE CROSS/BLUE SHIELD | Admitting: Student

## 2022-11-02 ENCOUNTER — Other Ambulatory Visit (HOSPITAL_COMMUNITY): Payer: Self-pay

## 2022-11-02 ENCOUNTER — Ambulatory Visit (HOSPITAL_COMMUNITY)
Admission: RE | Admit: 2022-11-02 | Discharge: 2022-11-02 | Disposition: A | Discharge status: Home / Self Care | Payer: BLUE CROSS/BLUE SHIELD | Source: Ambulatory Visit | Attending: Internal Medicine | Admitting: Internal Medicine

## 2022-11-02 ENCOUNTER — Other Ambulatory Visit: Payer: Self-pay

## 2022-11-02 ENCOUNTER — Encounter: Payer: Self-pay | Admitting: Student

## 2022-11-02 VITALS — BP 134/96 | HR 86 | Temp 98.1°F | Ht 66.0 in | Wt 175.5 lb

## 2022-11-02 DIAGNOSIS — R079 Chest pain, unspecified: Secondary | ICD-10-CM | POA: Insufficient documentation

## 2022-11-02 DIAGNOSIS — E1169 Type 2 diabetes mellitus with other specified complication: Secondary | ICD-10-CM

## 2022-11-02 DIAGNOSIS — R634 Abnormal weight loss: Secondary | ICD-10-CM | POA: Diagnosis not present

## 2022-11-02 DIAGNOSIS — Z794 Long term (current) use of insulin: Secondary | ICD-10-CM

## 2022-11-02 DIAGNOSIS — I10 Essential (primary) hypertension: Secondary | ICD-10-CM | POA: Diagnosis not present

## 2022-11-02 DIAGNOSIS — R0789 Other chest pain: Secondary | ICD-10-CM

## 2022-11-02 DIAGNOSIS — K869 Disease of pancreas, unspecified: Secondary | ICD-10-CM

## 2022-11-02 MED ORDER — INSULIN DEGLUDEC 100 UNIT/ML ~~LOC~~ SOPN
35.0000 [IU] | PEN_INJECTOR | Freq: Every day | SUBCUTANEOUS | 2 refills | Status: DC
  Filled 2022-11-02 (×3): qty 15, 42d supply, fill #0

## 2022-11-02 MED ORDER — METFORMIN HCL ER 500 MG PO TB24
500.0000 mg | ORAL_TABLET | Freq: Every day | ORAL | 1 refills | Status: DC
  Filled 2022-11-02: qty 90, 42d supply, fill #0

## 2022-11-02 MED ORDER — NOVOLOG FLEXPEN 100 UNIT/ML ~~LOC~~ SOPN
12.0000 [IU] | PEN_INJECTOR | Freq: Three times a day (TID) | SUBCUTANEOUS | 11 refills | Status: DC
  Filled 2022-11-02: qty 15, 42d supply, fill #0
  Filled 2022-11-02: qty 15, fill #0
  Filled 2022-12-24 – 2023-01-05 (×2): qty 15, 42d supply, fill #1
  Filled 2023-02-10: qty 15, 42d supply, fill #2
  Filled 2023-02-11 (×2): qty 15, 42d supply, fill #0
  Filled 2023-03-26 – 2023-03-30 (×2): qty 15, 42d supply, fill #1
  Filled 2023-06-02 – 2023-06-03 (×2): qty 15, 42d supply, fill #2

## 2022-11-02 MED ORDER — DEXCOM G7 RECEIVER DEVI
1.0000 | Freq: Every day | 0 refills | Status: DC
  Filled 2022-11-02 – 2022-11-08 (×3): qty 1, 90d supply, fill #0

## 2022-11-02 MED ORDER — DEXCOM G7 SENSOR MISC
1.0000 | Freq: Every day | 1 refills | Status: DC
  Filled 2022-11-02 – 2022-11-08 (×3): qty 9, 90d supply, fill #0

## 2022-11-02 NOTE — Assessment & Plan Note (Signed)
Assessment: Work up thus far benign. Will continue with cancer screening with colonoscopy. 12.5 pack year history, does not meet criteria for lung cancer screening. Likely secondary to his severely uncontrolled diabetes. If no improvement in weight gain with management of his diabetes, would then consider further imaging with CT chest abdomen and pelvis  Plan: - continue to monitor

## 2022-11-02 NOTE — Progress Notes (Signed)
CC: follow up diabetes  HPI:  [name] is a 62 y.o. male living with a history stated below and presents today for follow up of his diabetes. Please see problem based assessment and plan for additional details.  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 90 tablet 3   Blood Glucose Monitoring Suppl (ONETOUCH VERIO) w/Device KIT Use to check blood sugar. 1 kit 1   buPROPion (WELLBUTRIN XL) 150 MG 24 hr tablet Take 1 tablet (150 mg total) by mouth daily. 90 tablet 1   cetaphil (CETAPHIL) lotion Apply 1 Application topically as needed for dry skin. (Patient not taking: Reported on 10/29/2022) 236 mL 0   No current facility-administered medications on file prior to visit.    Review of Systems: ROS negative except for what is noted on the assessment and plan.  Vitals:   11/02/22 1011  BP: (!) 134/96  Pulse: 86  Temp: 98.1 F (36.7 C)  TempSrc: Oral  SpO2: 98%  Weight: 175 lb 8 oz (79.6 kg)  Height: 5\' 6"  (1.676 m)    Physical Exam: Constitutional: well-appearing, in no acute distress HENT: normocephalic atraumatic Eyes: conjunctiva non-erythematous Neck: supple Cardiovascular: regular rate and rhythm, no m/r/g Pulmonary/Chest: normal work of breathing on room air, lungs clear to auscultation bilaterally MSK: normal bulk and tone Neurological: alert & oriented x 3 Skin: warm and dry Psych: normal mood  Assessment & Plan:   Essential  hypertension Assessment: BP improved today, 134/96. Continue amlodipine-olmesartan 10-40 mg daily  Plan: - continue amlodipine-olmesartan 10-40 mg daily  Weight loss Assessment: Work up thus far benign. Will continue with cancer screening with colonoscopy. 12.5 pack year history, does not meet criteria for lung cancer screening. Likely secondary to his severely uncontrolled diabetes. 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 He has increased his novolog to 10 U TID with meals. He has not taken the farxiga as he wanted to read about the side effects. Prior to today he did not want to start metformin.   We discussed the need to increase his insulin regimen and for additional agents. His glucose levels are stil constnatly above 300, with an average of 441 for 15 total readings.   Will increase tresiba to 35 U and novolog to 12 U TID with meals. He is now interested in metformin so will start this and discontinue farxiga. His diabetes is thought to be secondary to pancreatic insufficiency so he is higher risk for euglycemic DKA. He's also interested in a CGM today.   Overall he is feeling much better and since having insurance his medications have been more accessible. Will bring him back in 2-3 months  and have him follow up with Butch Penny in 1 week. He is to call if no improvements in his glucose levels and we can get him back in to the office sooner.   Plan: - increase tresiba to 35 U and novolog to 12 U w/ meals - start metformin 500 mg daily and up-titrate as tolerable to max dose of 2000 mg - CGM ordered, follow up with Butch Penny - return for A1c in 2 months  Chest pressure Assessment: Endorses centralized  non-radiating chest pressure that onset two days ago. Denies associated shortness of breath, diaphoresis, nausea or vomiting. The pressure started while he was sleeping and persisted for two days. It resolved after taking his BP medicines. He has had a similar episode years ago but does not remember what the work up was or what led to resolution. He is no longer actively having the pain  EKG with ST changes in V1,V2 that are similar to EKG in the past along with depression in inferior leads. Do not believe he needs immediage evaluation but will refer to cardiology with concerning symptoms, EKG findings, and risk factors for CAD. Strict return precautions given to present to ED if symptoms return.   Plan: - follow up with cardiology outpatient for consideration of further testing  Patient discussed with Dr. Donnita Falls, D.O. [facility], PGY-3 Phone: [phone] Date 11/02/2022 Time 1:07 PM

## 2022-11-02 NOTE — Patient Instructions (Addendum)
Thank you, [name] for allowing [place] to provide your care today. Today we discussed.  Diabetes Please increase your novolog 12 U TID, tresiba to 35 U. We will start metformin. Take 1 pill a day and increase by 1 pill every other week. Please take no more than 4 per day.   I will also prescribe you a continuous glucose monitor.   Chest pain I believe this is from your blood pressure. We will check an EKG today  Dry skin/rash Please use moisturizing creams daily     I have ordered the following labs for you:  Lab Orders  No laboratory test(s) ordered today     Referrals ordered today:   Referral Orders  No referral(s) requested today     I have ordered the following medication/changed the following medications:   Stop the following medications: Medications Discontinued During This Encounter  Medication Reason   insulin degludec (TRESIBA) 100 UNIT/ML FlexTouch Pen Reorder   insulin aspart (NOVOLOG FLEXPEN) 100 UNIT/ML FlexPen Reorder     Start the following medications: Meds ordered this encounter  Medications   insulin aspart (NOVOLOG FLEXPEN) 100 UNIT/ML FlexPen    Sig: Inject 12 Units into the skin 3 (three) times daily with meals.    Dispense:  15 mL    Refill:  11    IM program   insulin degludec (TRESIBA) 100 UNIT/ML FlexTouch Pen    Sig: Inject 35 Units into the skin at bedtime.    Dispense:  15 mL    Refill:  2     Follow up: 2-3 months    Should you have any questions or concerns please call the internal medicine clinic at [phone].    Ricky Boyle, D.O. [place]

## 2022-11-02 NOTE — Assessment & Plan Note (Signed)
Assessment: Endorses centralized non-radiating chest pressure that onset two days ago. Denies associated shortness of breath, diaphoresis, nausea or vomiting. The pressure started while he was sleeping and persisted for two days. It resolved after taking his BP medicines. He has had a similar episode years ago but does not remember what the work up was or what led to resolution. He is no longer actively having the pain  EKG with ST changes in V1,V2 that are similar to EKG in the past along with depression in inferior leads. Do not believe he needs immediage evaluation but will refer to cardiology with concerning symptoms, EKG findings, and risk factors for CAD. Strict return precautions given to present to ED if symptoms return.   Plan: - follow up with cardiology outpatient for consideration of further testing

## 2022-11-02 NOTE — Assessment & Plan Note (Signed)
Assessment: Over the last week he has been adherent to his regimen of novolog 5 U and tresiba 25 U daily. He has increased his novolog to 10 U TID with meals. He has not taken the farxiga as he wanted to read about the side effects. Prior to today he did not want to start metformin.   We discussed the need to increase his insulin regimen and for additional agents. His glucose levels are stil constnatly above 300, with an average of 441 for 15 total readings.   Will increase tresiba to 35 U and novolog to 12 U TID with meals. He is now interested in metformin so will start this and discontinue farxiga. His diabetes is thought to be secondary to pancreatic insufficiency so he is higher risk for euglycemic DKA. He's also interested in a CGM today.   Overall he is feeling much better and since having insurance his medications have been more accessible. Will bring him back in 2-3 months and have him follow up with Butch Penny in 1 week. He is to call if no improvements in his glucose levels and we can get him back in to the office sooner.   Plan: - increase tresiba to 35 U and novolog to 12 U w/ meals - start metformin 500 mg daily and up-titrate as tolerable to max dose of 2000 mg - CGM ordered, follow up with Butch Penny - return for A1c in 2 months

## 2022-11-02 NOTE — Assessment & Plan Note (Signed)
Assessment: BP improved today, 134/96. Continue amlodipine-olmesartan 10-40 mg daily  Plan: - continue amlodipine-olmesartan 10-40 mg daily

## 2022-11-03 ENCOUNTER — Telehealth: Payer: Self-pay

## 2022-11-03 ENCOUNTER — Other Ambulatory Visit (HOSPITAL_COMMUNITY): Payer: Self-pay

## 2022-11-03 NOTE — Telephone Encounter (Addendum)
Prior Authorization for patient ([employer] G7 Sensor) came through on cover my meds was submitted with last office notes and labs awaiting approval or denial

## 2022-11-03 NOTE — Progress Notes (Signed)
Internal Medicine Clinic Attending  Case discussed with Dr. Katsadouros  At the time of the visit.  We reviewed the resident's history and exam and pertinent patient test results.  I agree with the assessment, diagnosis, and plan of care documented in the resident's note.  

## 2022-11-05 ENCOUNTER — Other Ambulatory Visit: Payer: Medicaid Other

## 2022-11-05 NOTE — Patient Instructions (Signed)
Visit Information  Ricky Boyle was given information about Medicaid Managed Care team care coordination services as a part of their [facility] Medicaid benefit. [name] verbally consented to engagement with the [facility] Managed Care team.   If you are experiencing a medical emergency, please call 911 or report to your local emergency department or urgent care.   If you have a non-emergency medical problem during routine business hours, please contact your provider's office and ask to speak with a nurse.   For questions related to your [facility] health plan, please call: [phone] or go here:[email or web address]  If you would like to schedule transportation through your [facility] plan, please call the following number at least 2 days in advance of your appointment: [phone].  You can also use the MTM portal or MTM mobile app to manage your rides. For the portal, please go to [email or web address]  Call the [facility] Crisis Line at [phone], at any time, 24 hours a day, 7 days a week. If you are in danger or need immediate medical attention call 911.  If you would like help to quit smoking, call 1-800-QUIT-NOW (805 481 8545) OR Espaol: 1-855-Djelo-Ya (4-492-010-0712) o para ms informacin haga clic aqu or Text READY to 197-588 to register via text  [name] - following are the goals we discussed in your visit today:   Goals Addressed   None     Social Worker will follow up on 12/06/22.   Gus Puma, [name], MHA [facility] Health  Managed Medicaid Social Worker [phone]   Following is a copy of your plan of care:  There are no care plans that you recently modified to display for this patient.

## 2022-11-05 NOTE — Patient Outreach (Signed)
Medicaid Managed Care Social Work Note  11/05/2022 Name:  Ricky Boyle MRN:  [id number] DOB:  1960-08-30  Ricky Boyle is an 62 y.o. year old male who is a primary patient of Ricky Bene, MD.  The Medicaid Managed Care Coordination team was consulted for assistance with:  Community Resources   [name] was given information about Medicaid Managed Care Coordination team services today. Ricky Boyle Patient agreed to services and verbal consent obtained.  Engaged with patient  for by telephone forfollow up visit in response to referral for case management and/or care coordination services.   Assessments/Interventions:  Review of past medical history, allergies, medications, health status, including review of consultants reports, laboratory and other test data, was performed as part of comprehensive evaluation and provision of chronic care management services.  SDOH: (Social Determinant of Health) assessments and interventions performed: SDOH Interventions    Flowsheet Row Patient Outreach Telephone from 09/29/2022 in [place] POPULATION HEALTH DEPARTMENT Care Coordination from 09/23/2022 in [facility] Community Care Coordination Telephone from 09/14/2022 in [facility]  SDOH Interventions     Food Insecurity Interventions Intervention Not Indicated Intervention Not Indicated Intervention Not Indicated  Housing Interventions -- Other (Comment)  [[place] [employer] Homelessness Prevention Program] Other (Comment)  [Needs referral to SW]  Transportation Interventions Intervention Not Indicated -- --  Utilities Interventions -- -- Intervention Not Indicated  Financial Strain Interventions -- Other (Comment)  [pt has applied for Disability and MM,  consult with MM team] --      BSW completed a telephone outreach with patient, he stated he did not receieve the email BSW sent with dental and eye providers. BSW verified email and resent.  Patient stated he would also like resources for housing, BSW sent patient affordable housing resources. Patient stated he found out his new insurance is [employer]. No other reosurces are needed at this time. Advanced Directives Status:  Not addressed in this encounter.  Care Plan                 Allergies  Allergen Reactions   Bee Venom Anaphylaxis   Shrimp [Shellfish Allergy] Anaphylaxis   Penicillins Other (See Comments)    Pt does not know what the reaction was    Medications Reviewed Today     Reviewed by Ricky Dach, RN (Registered Nurse) on 10/29/22 at [phone]  Med List Status: <None>   Medication Order Taking? Sig Documenting Provider Last Dose Status Informant  amLODipine-olmesartan (AZOR) 10-40 MG tablet [id number] Yes Take 1 tablet by mouth daily. Ricky Agee, MD Taking Active   Blood Glucose Monitoring Suppl [facility] VERIO) w/Device KIT [id number] Yes Use to check blood sugar. Ricky Agee, MD Taking Active   buPROPion (WELLBUTRIN XL) 150 MG 24 hr tablet [id number] Yes Take 1 tablet (150 mg total) by mouth daily. Ricky Agee, MD Taking Active   cetaphil (CETAPHIL) lotion [id number] No Apply 1 Application topically as needed for dry skin.  Patient not taking: Reported on 10/29/2022   Ricky Agee, MD Not Taking Active   dapagliflozin propanediol (FARXIGA) 5 MG TABS tablet [id number] No Take 1 tablet (5 mg total) by mouth daily before breakfast.  Patient not taking: Reported on 10/29/2022   Ricky Agee, MD Not Taking Active   glucose blood (ONETOUCH VERIO) test strip [id number] Yes Use to check blood sugar four times daily as directed. Ricky Agee, MD Taking Active   insulin aspart (NOVOLOG FLEXPEN) 100 UNIT/ML FlexPen [id number] Yes Inject 5 Units into  the skin 3 (three) times daily with meals. Ricky KannerBraswell, Phillip, MD Taking Active            Med Note (ROBB, MELANIE A   Fri Oct 29, 2022  9:07 AM) Taking 10 units with each meal   insulin degludec (TRESIBA) 100 UNIT/ML FlexTouch Pen [id number] Yes Inject 25 Units into the skin at bedtime. Ricky AgeeKatsadouros, Vasilios, MD Taking Active   Lancets [facility](ONETOUCH [place] PL[place]ANCET33G) MISC [id number] Yes Use to check blood sugar four times daily. 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   Follow up:  Patient agrees to Care Plan and Follow-up.  Plan: The Managed Medicaid care management team will reach out to the patient again over the next 30 days.  Date/time of next scheduled Social Work care management/care coordination outreach:  12/06/2022  Ricky Boyle, Ricky GowerBSW, [facility]  Managed [facility] Health[facility]icaid Social Worker [phone]

## 2022-11-08 ENCOUNTER — Other Ambulatory Visit (HOSPITAL_COMMUNITY): Payer: Self-pay

## 2022-12-02 ENCOUNTER — Telehealth: Payer: Self-pay | Admitting: Pharmacist

## 2022-12-02 ENCOUNTER — Other Ambulatory Visit: Payer: BLUE CROSS/BLUE SHIELD | Admitting: *Deleted

## 2022-12-02 ENCOUNTER — Encounter: Payer: Self-pay | Admitting: *Deleted

## 2022-12-02 ENCOUNTER — Telehealth: Payer: Self-pay | Admitting: Dietician

## 2022-12-02 NOTE — Patient Instructions (Signed)
Visit Information  Ricky Boyle was given information about Medicaid Managed Care team care coordination services as a part of their [facility] Medicaid benefit. Ricky Boyle verbally consented to engagement with the [facility] Managed Care team.   If you are experiencing a medical emergency, please call 911 or report to your local emergency department or urgent care.   If you have a non-emergency medical problem during routine business hours, please contact your provider's office and ask to speak with a nurse.   For questions related to your [facility] health plan, please call: [phone] or go here:[email or web address]  If you would like to schedule transportation through your [facility] plan, please call the following number at least 2 days in advance of your appointment: [phone].   You can also use the MTM portal or MTM mobile app to manage your rides. Reimbursement for transportation is available through [facility]! For the portal, please go to [email or web address]  Call the [facility] Crisis Line at [phone], at any time, 24 hours a day, 7 days a week. If you are in danger or need immediate medical attention call 911.  If you would like help to quit smoking, call 1-800-QUIT-NOW (747-046-1097) OR Espaol: 1-855-Djelo-Ya (4-132-440-1027) o para ms informacin haga clic aqu or Text READY to 253-664 to register via text  Ricky Boyle,   Please see education materials related to DM provided by MyChart link.  Patient verbalizes understanding of instructions and care plan provided today and agrees to view in MyChart. Active MyChart status and patient understanding of how to access instructions and care plan via MyChart confirmed with patient.     Telephone follow up appointment with Managed Medicaid care management team member scheduled for:01/04/23 @ 9am  [name] RN, BSN [name]  Managed [facility] RN Care Coordinator [phone]   Following is  a copy of your plan of care:  Care Plan : RN Care Manager Plan of Care  Updates made by Ricky Dach, RN since 12/02/2022 12:00 AM     Problem: Health Management needs related to DM      Long-Range Goal: Development of Plan of Care to address Health Management needs related to DM   Start Date: 09/29/2022  Expected End Date: 01/27/2023  Note:   Current Barriers:  Chronic Disease Management support and education needs related to DM Ricky Boyle is now using CGM to monitor his BS. He has not been scheduled for Nutrition or GI. He is only using Novolog with meals. He does not like the side effects of Tresiba and metformin.  RNCM Clinical Goal(s):  Patient will verbalize understanding of plan for management of DM as evidenced by patient reports take all medications exactly as prescribed and will call provider for medication related questions as evidenced by patient reports and EMR documentation    attend all scheduled medical appointments: 12/06/22 with BSW and 12/17/22 with Cardiology as evidenced by provider documentation        work with Child psychotherapist to address needing vision and dental providers related to the management of DM as evidenced by review of EMR and patient or Child psychotherapist report     through collaboration with Medical illustrator, provider, and care team.   Interventions: Inter-disciplinary care team collaboration (see longitudinal plan of care) Evaluation of current treatment plan related to  self management and patient's adherence to plan as established by provider   Diabetes:  (Status: Goal on Track (progressing): YES.) Long Term Goal   Lab Results  Component Value  Date   HGBA1C >14.0 (A) 09/15/2022   @ Assessed patient's understanding of A1c goal: <7% Reviewed medications with patient and discussed importance of medication adherence;        Reviewed prescribed diet with patient including a lean protein with each meal and snack; Counseled on importance of regular laboratory  monitoring as prescribed;        Reviewed scheduled/upcoming provider appointments including: 12/06/22 with BSW and 12/17/22 with Cardiology;         Referral made to pharmacy team for assistance with DM management;       Review of patient status, including review of consultants reports, relevant laboratory and other test results, and medications completed;       Assessed social determinant of health barriers;        Collaborate with Nutrition for follow up on referral-message sent to [facility] Advised patient to schedule follow up with PCP to discuss side effects of Tresiba and Metformin Provided patient with [facility] [phone] to follow up on GI referral placed in March 2024 Collaborated with Cipriano Bunker, Pharmacist  Patient Goals/Self-Care Activities: Take medications as prescribed   Attend all scheduled provider appointments Call provider office for new concerns or questions  Work with the social worker to address care coordination needs and will continue to work with the clinical team to address health care and disease management related needs schedule appointment with eye doctor check feet daily for cuts, sores or redness drink 6 to 8 glasses of water each day fill half of plate with vegetables

## 2022-12-02 NOTE — Patient Outreach (Signed)
Medicaid Managed Care   Nurse Care Manager Note  12/02/2022 Name:  Ricky Boyle MRN:  [id number] DOB:  [birth year]  Ricky Boyle is an 62 y.o. year old male who is a primary patient of Adron Bene, MD.  The [facility] Managed Care Coordination team was consulted for assistance with:    DMI  Ricky Boyle was given information about Medicaid Managed Care Coordination team services today. Ricky Boyle Patient agreed to services and verbal consent obtained.  Engaged with patient by telephone for follow up visit in response to provider referral for case management and/or care coordination services.   Assessments/Interventions:  Review of past medical history, allergies, medications, health status, including review of consultants reports, laboratory and other test data, was performed as part of comprehensive evaluation and provision of chronic care management services.  SDOH (Social Determinants of Health) assessments and interventions performed: SDOH Interventions    Flowsheet Row Patient Outreach Telephone from 12/02/2022 in [place] POPULATION HEALTH DEPARTMENT Patient Outreach Telephone from 09/29/2022 in [place] POPULATION HEALTH DEPARTMENT Care Coordination from 09/23/2022 in [facility] Community Care Coordination Telephone from 09/14/2022 in [facility]  SDOH Interventions      Food Insecurity Interventions -- Intervention Not Indicated Intervention Not Indicated Intervention Not Indicated  Housing Interventions -- -- Other (Comment)  [employer] Homelessness Prevention Program] Other (Comment)  [Needs referral to SW]  Transportation Interventions Intervention Not Indicated Intervention Not Indicated -- --  Utilities Interventions -- -- -- Intervention Not Indicated  Financial Strain Interventions -- -- Other (Comment)  [pt has applied for Disability and MM,  consult with MM team] --       Care Plan  Allergies  Allergen  Reactions   Bee Venom Anaphylaxis   Shrimp [Shellfish Allergy] Anaphylaxis   Penicillins Other (See Comments)    Pt does not know what the reaction was    Medications Reviewed Today     Reviewed by [name], RN (Registered Nurse) on 12/02/22 at [phone]  Med List Status: <None>   Medication Order Taking? Sig Documenting Provider Last Dose Status Informant  amLODipine-olmesartan (AZOR) 10-40 MG tablet [id number] Yes Take 1 tablet by mouth daily. Belva Agee, MD Taking Active   Blood Glucose Monitoring Suppl [facility] VERIO) w/Device KIT [id number] Yes Use to check blood sugar. Belva Agee, MD Taking Active   buPROPion (WELLBUTRIN XL) 150 MG 24 hr tablet [id number] Yes Take 1 tablet (150 mg total) by mouth daily. Belva Agee, MD Taking Active   cetaphil (CETAPHIL) lotion [id number] No Apply 1 Application topically as needed for dry skin.  Patient not taking: Reported on 10/29/2022   Belva Agee, MD Not Taking Active   Continuous Blood Gluc Receiver Vira Agar G7 [place]) Hardie Pulley [id number] Yes Use as directed Belva Agee, MD Taking Active   Continuous Blood Gluc Sensor (DEXCOM G7 SENSOR) MISC [id number] Yes Change every 10 days Belva Agee, MD Taking Active   insulin aspart (NOVOLOG FLEXPEN) 100 UNIT/ML FlexPen [id number] Yes Inject 12 Units into the skin 3 (three) times daily with meals. Belva Agee, MD Taking Active            Med Note (Caroleena Paolini A   Thu Dec 02, 2022  9:15 AM) Taking up to 20 units  insulin degludec (TRESIBA) 100 UNIT/ML FlexTouch Pen [id number] No Inject 35 Units into the skin at bedtime.  Patient not taking: Reported on 12/02/2022   Belva Agee, MD Not Taking Active   metFORMIN (GLUCOPHAGE-XR) 500 MG 24  hr tablet [id number] No Take 1 tablet (500 mg total) by mouth daily with breakfast. Increase every other week as tolerated. Max of 4 per day  Patient not taking: Reported on 12/02/2022   Belva Agee, MD Not Taking Active             Patient Active Problem List   Diagnosis Date Noted   Chest pressure 11/02/2022   Rash and nonspecific skin eruption 10/18/2022   Weight loss 10/15/2022   Fecal incontinence 10/15/2022   Polysubstance use disorder 09/16/2022   Housing instability, currently housed, at risk for homelessness 09/16/2022   Tobacco use disorder 03/30/2021   Healthcare maintenance 03/30/2021   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) 05/09/2020   Essential hypertension 04/20/2020   Pancreatitis 04/17/2020   OSA (obstructive sleep apnea) 06/16/2016    Conditions to be addressed/monitored per PCP order:   DMI  Care Plan : RN Care Manager Plan of Care  Updates made by [name], RN since 12/02/2022 12:00 AM     Problem: Health Management needs related to DM      Long-Range Goal: Development of Plan of Care to address Health Management needs related to DM   Start Date: 09/29/2022  Expected End Date: 01/27/2023  Note:   Current Barriers:  Chronic Disease Management support and education needs related to DM Ricky Boyle is now using CGM to monitor his BS. He has not been scheduled for Nutrition or GI. He is only using Novolog with meals. He does not like the side effects of Tresiba and metformin.  RNCM Clinical Goal(s):  Patient will verbalize understanding of plan for management of DM as evidenced by patient reports take all medications exactly as prescribed and will call provider for medication related questions as evidenced by patient reports and EMR documentation    attend all scheduled medical appointments: 12/06/22 with BSW and 12/17/22 with Cardiology as evidenced by provider documentation        work with Child psychotherapist to address needing vision and dental providers related to the management of DM as evidenced by review of EMR and patient or Child psychotherapist report     through collaboration with Medical illustrator, provider, and care team.    Interventions: Inter-disciplinary care team collaboration (see longitudinal plan of care) Evaluation of current treatment plan related to  self management and patient's adherence to plan as established by provider   Diabetes:  (Status: Goal on Track (progressing): YES.) Long Term Goal   Lab Results  Component Value Date   HGBA1C >14.0 (A) 09/15/2022   @ Assessed patient's understanding of A1c goal: <7% Reviewed medications with patient and discussed importance of medication adherence;        Reviewed prescribed diet with patient including a lean protein with each meal and snack; Counseled on importance of regular laboratory monitoring as prescribed;        Reviewed scheduled/upcoming provider appointments including: 12/06/22 with BSW and 12/17/22 with Cardiology;         Referral made to pharmacy team for assistance with DM management;       Review of patient status, including review of consultants reports, relevant laboratory and other test results, and medications completed;       Assessed social determinant of health barriers;        Collaborate with Nutrition for follow up on referral-message sent to [facility] Advised patient to schedule follow up with PCP to discuss side effects of Tresiba and Metformin Provided  patient with [facility] [phone] to follow up on GI referral placed in March 2024 Collaborated with Cipriano Bunker, Pharmacist  Patient Goals/Self-Care Activities: Take medications as prescribed   Attend all scheduled provider appointments Call provider office for new concerns or questions  Work with the social worker to address care coordination needs and will continue to work with the clinical team to address health care and disease management related needs schedule appointment with eye doctor check feet daily for cuts, sores or redness drink 6 to 8 glasses of water each day fill half of plate with vegetables       Follow Up:  Patient agrees to Care Plan  and Follow-up.  Plan: The Managed Medicaid care management team will reach out to the patient again over the next 30 days.  Date/time of next scheduled RN care management/care coordination outreach:  01/04/23 @ 9am  [name] RN, BSN   Managed Medicaid RN Care Coordinator [phone]

## 2022-12-02 NOTE — Telephone Encounter (Signed)
Call to patient per request of [place]  Managed Medicaid, RN Care Coordinator. Tried calling this patient. Their voicemail box is not set up. I was unable to leave a message

## 2022-12-02 NOTE — Progress Notes (Signed)
Attempted to contact patient for follow up medication management in response to case manager collaboration-inbasket message.  Patient did not answer. Voicemail not setup, so was unable to leave HIPAA compliant message for patient to return my call at their convenience. Will attempt reschedule.  Lynnda Shields, PharmD, BCPS Clinical Pharmacist [facility]

## 2022-12-06 ENCOUNTER — Other Ambulatory Visit: Payer: BLUE CROSS/BLUE SHIELD

## 2022-12-06 NOTE — Patient Instructions (Signed)
Visit Information  Ricky Boyle was given information about Medicaid Managed Care team care coordination services as a part of their [facility] Medicaid benefit. Ricky Boyle verbally consented to engagement with the [facility] Managed Care team.   If you are experiencing a medical emergency, please call 911 or report to your local emergency department or urgent care.   If you have a non-emergency medical problem during routine business hours, please contact your provider's office and ask to speak with a nurse.   For questions related to your [facility] health plan, please call: [phone] or go here:[email or web address]  If you would like to schedule transportation through your [facility] plan, please call the following number at least 2 days in advance of your appointment: [phone].   You can also use the MTM portal or MTM mobile app to manage your rides. Reimbursement for transportation is available through [facility]! For the portal, please go to [email or web address]  Call the [facility] Crisis Line at [phone], at any time, 24 hours a day, 7 days a week. If you are in danger or need immediate medical attention call 911.  If you would like help to quit smoking, call 1-800-QUIT-NOW (407-158-3225) OR Espaol: 1-855-Djelo-Ya (4-132-440-1027) o para ms informacin haga clic aqu or Text READY to 253-664 to register via text  [name] - following are the goals we discussed in your visit today:   Goals Addressed   None       The  Patient                                              has been provided with contact information for the Managed Medicaid care management team and has been advised to call with any health related questions or concerns.   Ricky Boyle, Ricky Boyle, MHA [facility]  Managed Medicaid Social Worker [phone]   Following is a copy of your plan of care:  There are no care plans that you recently modified to display for this  patient.

## 2022-12-06 NOTE — Patient Outreach (Signed)
Medicaid Managed Care Social Work Note  12/06/2022 Name:  Ricky Boyle MRN:  [id number] DOB:  05-Aug-1960  Ricky Boyle is an 62 y.o. year old male who is a primary patient of Ricky Bene, MD.  The Medicaid Managed Care Coordination team was consulted for assistance with:  Community Resources   Ricky Boyle was given information about Medicaid Managed Care Coordination team services today. Ricky Boyle Patient agreed to services and verbal consent obtained.  Engaged with patient  for by telephone forfollow up visit in response to referral for case management and/or care coordination services.   Assessments/Interventions:  Review of past medical history, allergies, medications, health status, including review of consultants reports, laboratory and other test data, was performed as part of comprehensive evaluation and provision of chronic care management services.  SDOH: (Social Determinant of Health) assessments and interventions performed: SDOH Interventions    Flowsheet Row Patient Outreach Telephone from 12/02/2022 in [place] POPULATION [facility]ALTH DEPARTMENT Patient Outreach Telephone from 09/29/2022 in [place] POPULATION HEALTH DEPARTMENT Care Coordination from 09/23/2022 in [facility] Community Care Coordination Telephone from 09/14/2022 in [facility]  SDOH Interventions      Food Insecurity Interventions -- Intervention Not Indicated Intervention Not Indicated Intervention Not Indicated  Housing Interventions -- -- Other (Comment)  [employer] Homelessness Prevention Program] Other (Comment)  [Needs referral to SW]  Transportation Interventions Intervention Not Indicated Intervention Not Indicated -- --  Utilities Interventions -- -- -- Intervention Not Indicated  Financial Strain Interventions -- -- Other (Comment)  [pt has applied for Disability and MM,  consult with MM team] --     BSW completed a telephone outreach with  patient. He states he did receive resources BSW emailed to him. No additoinal resources are needed at this time.  Advanced Directives Status:  Not addressed in this encounter.  Care Plan                 Allergies  Allergen Reactions   Bee Venom Anaphylaxis   Shrimp [Shellfish Allergy] Anaphylaxis   Penicillins Other (See Comments)    Pt does not know what the reaction was    Medications Reviewed Today     Reviewed by Ricky Dach, RN (Registered Nurse) on 12/02/22 at [phone]  Med List Status: <None>   Medication Order Taking? Sig Documenting Provider Last Dose Status Informant  amLODipine-olmesartan (AZOR) 10-40 MG tablet [id number] Yes Take 1 tablet by mouth daily. Ricky Agee, MD Taking Active   Blood Glucose Monitoring Suppl [facility] VERIO) w/Device KIT [id number] Yes Use to check blood sugar. Ricky Agee, MD Taking Active   buPROPion (WELLBUTRIN XL) 150 MG 24 hr tablet [id number] Yes Take 1 tablet (150 mg total) by mouth daily. Ricky Agee, MD Taking Active   cetaphil (CETAPHIL) lotion [id number] No Apply 1 Application topically as needed for dry skin.  Patient not taking: Reported on 10/29/2022   Ricky Agee, MD Not Taking Active   Continuous Blood Gluc Receiver Vira Agar G7 [place]) Ricky Boyle [id number] Yes Use as directed Ricky Agee, MD Taking Active   Continuous Blood Gluc Sensor (DEXCOM G7 SENSOR) MISC [id number] Yes Change every 10 days Ricky Agee, MD Taking Active   insulin aspart (NOVOLOG FLEXPEN) 100 UNIT/ML FlexPen [id number] Yes Inject 12 Units into the skin 3 (three) times daily with meals. Ricky Agee, MD Taking Active            Med Note Ardelia Mems, Etta Quill   Thu Dec 02, 2022  9:15  AM) Taking up to 20 units  insulin degludec (TRESIBA) 100 UNIT/ML FlexTouch Pen [id number] No Inject 35 Units into the skin at bedtime.  Patient not taking: Reported on 12/02/2022   Ricky Agee, MD Not Taking Active   metFORMIN  (GLUCOPHAGE-XR) 500 MG 24 hr tablet [id number] No Take 1 tablet (500 mg total) by mouth daily with breakfast. Increase every other week as tolerated. 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   Follow up:  Patient agrees to Care Plan and Follow-up.  Plan: The  Patient has been provided with contact information for the Managed Medicaid care management team and has been advised to call with any health related questions or concerns.    Ricky Boyle, MHA [facility]  Managed [facility] Social Worker [phone]

## 2022-12-07 NOTE — Telephone Encounter (Signed)
Thank you very much 

## 2022-12-07 NOTE — Telephone Encounter (Signed)
Called and spoke with [place] from [place].   Prior Authorization has been approved effective 11/03/22-05/02/23

## 2022-12-08 ENCOUNTER — Other Ambulatory Visit: Payer: BLUE CROSS/BLUE SHIELD | Admitting: Pharmacist

## 2022-12-08 MED ORDER — INSULIN GLARGINE 100 UNITS/ML SOLOSTAR PEN
15.0000 [IU] | PEN_INJECTOR | Freq: Every day | SUBCUTANEOUS | 11 refills | Status: DC
  Filled 2022-12-08: qty 15, 90d supply, fill #0

## 2022-12-08 NOTE — Progress Notes (Signed)
12/08/2022 Name: Ricky Boyle MRN: [id number] DOB: [birth year]  Chief Complaint  Patient presents with   Diabetes   Medication Assistance    Ricky Boyle is a 62 y.o. year old male who presented for a telephone visit.   They were referred to the pharmacist by their PCP for assistance in managing diabetes, hypertension, and medication access.   Patient is participating in a Managed Medicaid Plan:  Yes  Patient reports the following about insurance and current medication supplies: Medicaid approved and prescriptions are processed smoothly Previously [employer] of [employer] via Obamacare His previous insurance ended 08/01/22, and was able to fill a last round of medications on 08/13/22.  Subjective:  Care Team: Primary Care Provider: Adron Bene, MD ; Next Scheduled Visit: 10/04/22  Medication Access/Adherence  Current Pharmacy:  [facility] [address] [place] [address] Phone: [phone] Fax: [phone]   Patient reports affordability concerns with their medications: Yes  Patient reports access/transportation concerns to their pharmacy: Yes  Patient reports adherence concerns with their medications:  Yes     Diabetes:  Current medications: 15 units novolog TID at meals, also prescribed metformin & tresiba but not taking due to side effects (metformin - he describes sharp kidney pains and body felt bad, tresiba - describes weight loss/muscle mass loss)  Current glucose readings: 152-172 fasting, jumps to 400 with food, gives 15 units novolog Avg glucose: 330   Checking via new Dexcom and enjoying it  Patient denies hypoglycemic s/sx including dizziness, shakiness, sweating. Patient reports hyperglycemic symptoms including polyuria, polydipsia, polyphagia, nocturia, neuropathy, blurred vision.   Current meal patterns: sheetz, hot dogs, patient describes challenge with current circumstances.   Hypertension:  Current  medications: amlodipine-olmesartan 10-40mg  daily   Patient does not have a validated, automated, upper arm home BP cuff Current blood pressure readings readings: not currently checking  Patient denies hypotensive s/sx including dizziness, lightheadedness.  Patient denies hypertensive symptoms including headache, chest pain, shortness of breath  Medication Management:  Current adherence strategy: taking medications as prescribed with exception of metformin and tresiba  Patient reports Good adherence to medications  Patient reports the following barriers to adherence: none at present    Objective:  Lab Results  Component Value Date   HGBA1C >14.0 (A) 09/15/2022    Lab Results  Component Value Date   CREATININE 1.04 10/15/2022   BUN 15 10/15/2022   NA 134 10/15/2022   K 5.0 10/15/2022   CL 94 (L) 10/15/2022   CO2 23 10/15/2022    Lab Results  Component Value Date   CHOL 149 04/20/2020   HDL 17 (L) 04/20/2020   LDLCALC 87 04/20/2020   TRIG 224 (H) 04/20/2020   CHOLHDL 8.8 04/20/2020    Medications Reviewed Today     Reviewed by Gabriel Carina, RPH (Pharmacist) on 12/08/22 at 0915  Med List Status: <None>   Medication Order Taking? Sig Documenting Provider Last Dose Status Informant  amLODipine-olmesartan (AZOR) 10-40 MG tablet [id number] Yes Take 1 tablet by mouth daily. Belva Agee, MD Taking Active   Blood Glucose Monitoring Suppl [facility] VERIO) w/Device KIT [id number] Yes Use to check blood sugar. Belva Agee, MD Taking Active   buPROPion (WELLBUTRIN XL) 150 MG 24 hr tablet [id number] Yes Take 1 tablet (150 mg total) by mouth daily. Belva Agee, MD Taking Active   cetaphil (CETAPHIL) lotion [id number] Yes Apply 1 Application topically as needed for dry skin. Belva Agee, MD Taking Active   Continuous Blood  Gluc Receiver (DEXCOM G7 [place]) DEVI [id number] Yes Use as directed Belva Agee, MD Taking Active   Continuous  Blood Gluc Sensor (DEXCOM G7 SENSOR) MISC [id number] Yes Change every 10 days Belva Agee, MD Taking Active   insulin aspart (NOVOLOG FLEXPEN) 100 UNIT/ML FlexPen [id number] Yes Inject 12 Units into the skin 3 (three) times daily with meals. Belva Agee, MD Taking Active            Med Note (ROBB, MELANIE A   Thu Dec 02, 2022  9:15 AM) Taking up to 20 units  insulin degludec (TRESIBA) 100 UNIT/ML FlexTouch Pen [id number] No Inject 35 Units into the skin at bedtime.  Patient not taking: Reported on 12/02/2022   Belva Agee, MD Not Taking Active   metFORMIN (GLUCOPHAGE-XR) 500 MG 24 hr tablet [id number] No Take 1 tablet (500 mg total) by mouth daily with breakfast. Increase every other week as tolerated. Max of 4 per day  Patient not taking: Reported on 12/02/2022   Belva Agee, MD Not Taking Active               Assessment/Plan:   Diabetes: - Currently uncontrolled - Reviewed goal A1c, goal fasting, and goal 2 hour post prandial glucose - Recommend to switch from tresiba to lantus, patient has history of using lantus in the past and is amenable to trying again for long-acting insulin and obtaining better fasting glucose control. Will facilitate with PCP for RX of lantus 15 units nightly. - Recommend to adjust short acting novolog administration time to 10-15 minutes prior to meal to avoid/reduce the BG spikes to 400. - Recommend to check glucose using dexcom - Patient expressed interest in insulin pump, recommended he set up a follow up with PCP as likely will be managed by endocrinology referral  Hypertension: - Currently uncontrolled, but improving - Reviewed long term cardiovascular and renal outcomes of uncontrolled blood pressure - Reviewed appropriate blood pressure monitoring technique and reviewed goal blood pressure. - Recommend to continue current medications, will prioritize glucose control for right now and after insurance changes take  effect, will then work on optimizing hypertension medication.   Medication Management: - Currently strategy sufficient to maintain appropriate adherence to prescribed medication regimen - Will adjust long-acting insulin prescription to avoid patient's undesirable side effects   Follow Up Plan: 1 week  Lynnda Shields, PharmD Clinical Pharmacist [facility] At [facility] [phone]

## 2022-12-08 NOTE — Patient Instructions (Signed)
Ricky Boyle,  Thanks for chatting with me today! Remember, take your novolog ~10-15 minutes before starting your meals. Keep an eye out for a prescription to get back on lantus, 15 units at bedtime. The goal is to have your morning sugars closer to 130 or lower, but not less than 70.  We will talk on the phone in a week to see how everything is going!  Take care, Ricky Boyle, PharmD, BCPS Clinical Pharmacist [facility]

## 2022-12-09 ENCOUNTER — Other Ambulatory Visit (HOSPITAL_COMMUNITY): Payer: Self-pay

## 2022-12-15 ENCOUNTER — Other Ambulatory Visit: Payer: BLUE CROSS/BLUE SHIELD | Admitting: Pharmacist

## 2022-12-15 NOTE — Patient Instructions (Signed)
Ricky Boyle,  Don't forget to pick up your lantus from the pharmacy as soon as possible!! You can place the copay on a bill if cost is an issue.  Take care and happy upcoming birthday!!!   Elmarie Shiley, PharmD, BCPS Clinical Pharmacist [facility]

## 2022-12-15 NOTE — Progress Notes (Signed)
12/15/2022 Name: Ricky Boyle MRN: [id number] DOB: Feb 09, 1961  Chief Complaint  Patient presents with   Diabetes   Medication Assistance    Ricky Boyle is a 62 y.o. year old male who presented for a telephone visit.   They were referred to the pharmacist by their PCP for assistance in managing diabetes, hypertension, and medication access.   Patient is participating in a Managed Medicaid Plan:  Yes  Patient reports the following about insurance and current medication supplies: Medicaid approved and prescriptions are processed smoothly Previously [employer] of [employer] via Obamacare His previous insurance ended 08/01/22, and was able to fill a last round of medications on 08/13/22.  Subjective:  Care Team: Primary Care Provider: Adron Bene, MD ; Next Scheduled Visit: 10/04/22  Medication Access/Adherence  Current Pharmacy:  [facility] [address] [place] [address] Phone: [phone] Fax: [phone]   Patient reports affordability concerns with their medications: Yes  Patient reports access/transportation concerns to their pharmacy: Yes  Patient reports adherence concerns with their medications:  Yes     Diabetes:  Current medications: 15 units novolog TID at meals, also prescribed metformin & tresiba but not taking due to side effects (metformin - he describes sharp kidney pains and body felt bad, tresiba - describes weight loss/muscle mass loss). Newly prescribed lantus in place of tresiba - has not yet picked up from pharmacy.  Current glucose readings: 152-172 fasting, jumps to 400 with food, gives 15 units novolog Avg glucose: 330  Patient states average glucose are 150 after meals + short acting insulin has kicked in.  Checking via new Dexcom and enjoying it  Patient denies hypoglycemic s/sx including dizziness, shakiness, sweating. Patient reports hyperglycemic symptoms including polyuria, polydipsia, polyphagia,  nocturia, neuropathy, blurred vision.   Current meal patterns: sheetz, hot dogs, patient describes challenge with current circumstances.   Hypertension:  Current medications: amlodipine-olmesartan 10-40mg  daily   Patient does not have a validated, automated, upper arm home BP cuff Current blood pressure readings readings: not currently checking  Patient denies hypotensive s/sx including dizziness, lightheadedness.  Patient denies hypertensive symptoms including headache, chest pain, shortness of breath  Medication Management:  Current adherence strategy: taking medications as prescribed with exception of metformin and has not yet picked up new lantus (switched from tresiba)  Patient reports Good adherence to medications  Patient reports the following barriers to adherence: none at present    Objective:  Lab Results  Component Value Date   HGBA1C >14.0 (A) 09/15/2022    Lab Results  Component Value Date   CREATININE 1.04 10/15/2022   BUN 15 10/15/2022   NA 134 10/15/2022   K 5.0 10/15/2022   CL 94 (L) 10/15/2022   CO2 23 10/15/2022    Lab Results  Component Value Date   CHOL 149 04/20/2020   HDL 17 (L) 04/20/2020   LDLCALC 87 04/20/2020   TRIG 224 (H) 04/20/2020   CHOLHDL 8.8 04/20/2020    Medications Reviewed Today     Reviewed by Gabriel Carina, RPH (Pharmacist) on 12/08/22 at 0915  Med List Status: <None>   Medication Order Taking? Sig Documenting Provider Last Dose Status Informant  amLODipine-olmesartan (AZOR) 10-40 MG tablet [id number] Yes Take 1 tablet by mouth daily. Belva Agee, MD Taking Active   Blood Glucose Monitoring Suppl [facility] VERIO) w/Device KIT [id number] Yes Use to check blood sugar. Belva Agee, MD Taking Active   buPROPion (WELLBUTRIN XL) 150 MG 24 hr tablet [id number] Yes  Take 1 tablet (150 mg total) by mouth daily. Belva Agee, MD Taking Active   cetaphil (CETAPHIL) lotion [id number] Yes Apply 1 Application  topically as needed for dry skin. Belva Agee, MD Taking Active   Continuous Blood Gluc Receiver (DEXCOM G7 [place]) DEVI [id number] Yes Use as directed Belva Agee, MD Taking Active   Continuous Blood Gluc Sensor (DEXCOM G7 SENSOR) MISC [id number] Yes Change every 10 days Belva Agee, MD Taking Active   insulin aspart (NOVOLOG FLEXPEN) 100 UNIT/ML FlexPen [id number] Yes Inject 12 Units into the skin 3 (three) times daily with meals. Belva Agee, MD Taking Active            Med Note (ROBB, MELANIE A   Thu Dec 02, 2022  9:15 AM) Taking up to 20 units  insulin degludec (TRESIBA) 100 UNIT/ML FlexTouch Pen [id number] No Inject 35 Units into the skin at bedtime.  Patient not taking: Reported on 12/02/2022   Belva Agee, MD Not Taking Active   metFORMIN (GLUCOPHAGE-XR) 500 MG 24 hr tablet [id number] No Take 1 tablet (500 mg total) by mouth daily with breakfast. Increase every other week as tolerated. Max of 4 per day  Patient not taking: Reported on 12/02/2022   Belva Agee, MD Not Taking Active               Assessment/Plan:   Diabetes: - Currently uncontrolled - Reviewed goal A1c, goal fasting, and goal 2 hour post prandial glucose - Recommend to pick up lantus from pharmacy and implement 15 units nightly - Recommend to adjust short acting novolog administration time to 10-15 minutes prior to meal to avoid/reduce the BG spikes to 400. - Recommend to check glucose using dexcom - Patient expressed interest in insulin pump, recommended he set up a follow up with PCP as likely will be managed by endocrinology referral  Hypertension: - Currently uncontrolled, but improving - Reviewed long term cardiovascular and renal outcomes of uncontrolled blood pressure - Reviewed appropriate blood pressure monitoring technique and reviewed goal blood pressure. - Recommend to continue current medications, will prioritize glucose control for right now  and after insurance changes take effect, will then work on optimizing hypertension medication.   Medication Management: - Currently strategy sufficient to maintain appropriate adherence to prescribed medication regimen   Follow Up Plan: 1 week/as needed  Lynnda Shields, PharmD Clinical Pharmacist [facility] At [facility] [phone]

## 2022-12-16 ENCOUNTER — Other Ambulatory Visit (HOSPITAL_COMMUNITY): Payer: Self-pay

## 2022-12-16 NOTE — Telephone Encounter (Signed)
Patient contacted and he scheduled for Tuesday, May 21 at 1:15 PM.

## 2022-12-17 ENCOUNTER — Ambulatory Visit: Payer: BLUE CROSS/BLUE SHIELD | Admitting: Internal Medicine

## 2022-12-17 ENCOUNTER — Other Ambulatory Visit: Payer: Self-pay

## 2022-12-17 ENCOUNTER — Other Ambulatory Visit (HOSPITAL_COMMUNITY): Payer: Self-pay

## 2022-12-20 ENCOUNTER — Other Ambulatory Visit (HOSPITAL_COMMUNITY): Payer: Self-pay

## 2022-12-20 ENCOUNTER — Other Ambulatory Visit: Payer: Self-pay | Admitting: Student

## 2022-12-20 MED ORDER — INSULIN GLARGINE 100 UNIT/ML SOLOSTAR PEN
15.0000 [IU] | PEN_INJECTOR | Freq: Every day | SUBCUTANEOUS | 11 refills | Status: DC
  Filled 2022-12-20 – 2023-01-05 (×2): qty 12, 80d supply, fill #0

## 2022-12-21 ENCOUNTER — Encounter: Payer: Self-pay | Admitting: Dietician

## 2022-12-21 ENCOUNTER — Other Ambulatory Visit (HOSPITAL_COMMUNITY): Payer: Self-pay

## 2022-12-23 ENCOUNTER — Other Ambulatory Visit (HOSPITAL_COMMUNITY): Payer: Self-pay

## 2022-12-24 ENCOUNTER — Other Ambulatory Visit (HOSPITAL_COMMUNITY): Payer: Self-pay

## 2022-12-24 ENCOUNTER — Other Ambulatory Visit: Payer: Self-pay

## 2023-01-01 DIAGNOSIS — Z419 Encounter for procedure for purposes other than remedying health state, unspecified: Secondary | ICD-10-CM | POA: Diagnosis not present

## 2023-01-04 ENCOUNTER — Encounter: Payer: Self-pay | Admitting: *Deleted

## 2023-01-04 ENCOUNTER — Other Ambulatory Visit: Payer: BLUE CROSS/BLUE SHIELD | Admitting: *Deleted

## 2023-01-04 NOTE — Patient Outreach (Signed)
Medicaid Managed Care   Nurse Care Manager Note  01/04/2023 Name:  Ricky Boyle MRN:  [id number] DOB:  02-18-1961  Ricky Boyle is an 62 y.o. year old male who is a primary patient of Adron Bene, MD.  The [facility] Managed Care Coordination team was consulted for assistance with:    DMII  Ricky Boyle was given information about Medicaid Managed Care Coordination team services today. [name] Patient agreed to services and verbal consent obtained.  Engaged with patient by telephone for follow up visit in response to provider referral for case management and/or care coordination services.   Assessments/Interventions:  Review of past medical history, allergies, medications, health status, including review of consultants reports, laboratory and other test data, was performed as part of comprehensive evaluation and provision of chronic care management services.  SDOH (Social Determinants of Health) assessments and interventions performed: SDOH Interventions    Flowsheet Row Patient Outreach Telephone from 01/04/2023 in [place] POPULATION HEALTH DEPARTMENT Patient Outreach Telephone from 12/02/2022 in [place] POPULATION HEALTH DEPARTMENT Patient Outreach Telephone from 09/29/2022 in [place] POPULATION HEALTH DEPARTMENT Care Coordination from 09/23/2022 in [facility] Care Coordination Telephone from 09/14/2022 in [facility]  SDOH Interventions       Food Insecurity Interventions -- -- Intervention Not Indicated Intervention Not Indicated Intervention Not Indicated  Housing Interventions -- -- -- Other (Comment)  [employer] Homelessness Prevention Program] Other (Comment)  [Needs referral to SW]  Transportation Interventions Payor Benefit  [Advised to use [employer] transportation and contact pharmacy for prescription delivery] Intervention Not Indicated Intervention Not Indicated -- --  Utilities Interventions -- -- -- --  Intervention Not Indicated  Financial Strain Interventions -- -- -- Other (Comment)  [pt has applied for Disability and MM,  consult with MM team] --       Care Plan  Allergies  Allergen Reactions   Bee Venom Anaphylaxis   Shrimp [Shellfish Allergy] Anaphylaxis   Metformin And Related Other (See Comments)    Sharp kidney pains, body felt bad   Penicillins Other (See Comments)    Pt does not know what the reaction was    Medications Reviewed Today     Reviewed by [name], RN (Registered Nurse) on 01/04/23 at [phone]  Med List Status: <None>   Medication Order Taking? Sig Documenting Provider Last Dose Status Informant  amLODipine-olmesartan (AZOR) 10-40 MG tablet [id number] Yes Take 1 tablet by mouth daily. Belva Agee, MD Taking Active   Blood Glucose Monitoring Suppl [facility] VERIO) w/Device KIT [id number] Yes Use to check blood sugar. Belva Agee, MD Taking Active   buPROPion (WELLBUTRIN XL) 150 MG 24 hr tablet [id number] Yes Take 1 tablet (150 mg total) by mouth daily. Belva Agee, MD Taking Active   cetaphil (CETAPHIL) lotion [id number] No Apply 1 Application topically as needed for dry skin.  Patient not taking: Reported on 01/04/2023   Belva Agee, MD Not Taking Active   Continuous Blood Gluc Receiver [facility] G7 Britt) Hardie Pulley [id number] Yes Use as directed Belva Agee, MD Taking Active   Continuous Blood Gluc Sensor (DEXCOM G7 SENSOR) MISC [id number] Yes Change every 10 days Belva Agee, MD Taking Active   insulin aspart (NOVOLOG FLEXPEN) 100 UNIT/ML FlexPen [id number] No Inject 12 Units into the skin 3 (three) times daily with meals.  Patient not taking: Reported on 01/04/2023   Belva Agee, MD Not Taking Active            Med Note (  Janthony Holleman A   Tue Jan 04, 2023  9:21 AM) Needs refill  Insulin Degludec (TRESIBA) 100 UNIT/ML SOLN [id number] Yes Inject 38 Units into the skin daily. [provider]  Taking Active   insulin glargine (LANTUS) 100 UNIT/ML Solostar Pen [id number] No Inject 15 Units into the skin at bedtime.  Patient not taking: Reported on 01/04/2023   Adron Bene, MD Not Taking Active   insulin glargine (LANTUS) 100 unit/mL SOPN [id number] No Inject 15 Units into the skin at bedtime.  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   Essential hypertension 04/20/2020   Pancreatitis 04/17/2020   OSA (obstructive sleep apnea) 06/16/2016    Conditions to be addressed/monitored per PCP order:  DMII  Care Plan : RN Care Manager Plan of Care  Updates made by [name], RN since 01/04/2023 12:00 AM     Problem: Health Management needs related to DM      Long-Range Goal: Development of Plan of Care to address Health Management needs related to DM   Start Date: 09/29/2022  Expected End Date: 01/27/2023  Note:   Current Barriers:  Chronic Disease Management support and education needs related to DM Ricky Boyle ran out to Novolog and Lantus and has not had transportation to pick up refill. Average BS reading is 258, improved from 400. He would like a GI referral to a [facility] provider for colon cancer screening.  RNCM Clinical Goal(s):  Patient will verbalize understanding of plan for management of DM as evidenced by patient reports take all medications exactly as prescribed and will call provider for medication related questions as evidenced by patient reports and EMR  documentation    attend all scheduled medical appointments:  02/14/23 with Cardiology as evidenced by provider documentation        work with Child psychotherapist to address needing vision and dental providers related to the management of DM as evidenced by review of EMR and patient or Child psychotherapist report     through collaboration with Medical illustrator, provider, and care team.   Interventions: Inter-disciplinary care team collaboration (see longitudinal plan of care) Evaluation of current treatment plan related to  self management and patient's adherence to plan as established by provider   Diabetes:  (Status: Goal on Track (progressing): YES.) Long Term Goal   Lab Results  Component Value Date   HGBA1C >14.0 (A) 09/15/2022   @ Assessed patient's understanding of A1c goal: <7% Reviewed medications with patient and discussed importance of medication adherence;        Reviewed prescribed diet with patient including a lean protein with each meal and snack; Counseled on importance of regular laboratory monitoring as prescribed;        Reviewed scheduled/upcoming provider appointments including: reschedule with Nutrition and schedule PCP follow up;         Review of patient status, including review of consultants reports, relevant laboratory and other test results, and medications completed;  Assessed social determinant of health barriers;        Advised to reschedule missed appointment with Nutrition Advised patient to contact pharmacy for medication delivery-uses [facility] Collaborated with PCP for new referral for GI and foot exam with [place] provider-per patient request   Patient Goals/Self-Care Activities: Take medications as prescribed   Attend all scheduled provider appointments Call provider office for new concerns or questions  Work with the social worker to address care coordination needs and will continue to work with the clinical team to address health  care and disease management related needs schedule appointment with eye doctor check feet daily for cuts, sores or redness drink 6 to 8 glasses of water each day fill half of plate with vegetables       Follow Up:  Patient agrees to Care Plan and Follow-up.  Plan: The Managed Medicaid care management team will reach out to the patient again over the next 30 days.  Date/time of next scheduled RN care management/care coordination outreach:  02/07/23 @ 9am  [name] RN, BSN [place]  Managed [facility] RN Care Coordinator [phone]

## 2023-01-04 NOTE — Patient Instructions (Signed)
Visit Information  Ricky Boyle was given information about Medicaid Managed Care team care coordination services as a part of their [facility] Medicaid benefit. Ricky Boyle verbally consented to engagement with the [facility] Managed Care team.   If you are experiencing a medical emergency, please call 911 or report to your local emergency department or urgent care.   If you have a non-emergency medical problem during routine business hours, please contact your provider's office and ask to speak with a nurse.   For questions related to your [facility] health plan, please call: [phone] or go here:[email or web address]  If you would like to schedule transportation through your [facility] plan, please call the following number at least 2 days in advance of your appointment: [phone].   You can also use the MTM portal or MTM mobile app to manage your rides. Reimbursement for transportation is available through [facility]! For the portal, please go to [email or web address]  Call the [facility] Crisis Line at [phone], at any time, 24 hours a day, 7 days a week. If you are in danger or need immediate medical attention call 911.  If you would like help to quit smoking, call 1-800-QUIT-NOW (479-368-8010) OR Espaol: 1-855-Djelo-Ya (6-387-564-3329) o para ms informacin haga clic aqu or Text READY to 518-841 to register via text  [name],   Please see education materials related to DM provided by MyChart link.  Patient verbalizes understanding of instructions and care plan provided today and agrees to view in MyChart. Active MyChart status and patient understanding of how to access instructions and care plan via MyChart confirmed with patient.     Telephone follow up appointment with Managed Medicaid care management team member scheduled for:02/07/23 @ 9 am  Ricky Emms RN, BSN [place]  Managed [facility] RN Care Coordinator [phone]   Following is  a copy of your plan of care:  Care Plan : RN Care Manager Plan of Care  Updates made by [name], RN since 01/04/2023 12:00 AM     Problem: Health Management needs related to DM      Long-Range Goal: Development of Plan of Care to address Health Management needs related to DM   Start Date: 09/29/2022  Expected End Date: 01/27/2023  Note:   Current Barriers:  Chronic Disease Management support and education needs related to DM Ricky Boyle ran out to Novolog and Lantus and has not had transportation to pick up refill. Average BS reading is 258, improved from 400. He would like a GI referral to a [facility] provider for colon cancer screening.  RNCM Clinical Goal(s):  Patient will verbalize understanding of plan for management of DM as evidenced by patient reports take all medications exactly as prescribed and will call provider for medication related questions as evidenced by patient reports and EMR documentation    attend all scheduled medical appointments:  02/14/23 with Cardiology as evidenced by provider documentation        work with Child psychotherapist to address needing vision and dental providers related to the management of DM as evidenced by review of EMR and patient or Child psychotherapist report     through collaboration with Medical illustrator, provider, and care team.   Interventions: Inter-disciplinary care team collaboration (see longitudinal plan of care) Evaluation of current treatment plan related to  self management and patient's adherence to plan as established by provider   Diabetes:  (Status: Goal on Track (progressing): YES.) Long Term Goal   Lab Results  Component  Value Date   HGBA1C >14.0 (A) 09/15/2022   @ Assessed patient's understanding of A1c goal: <7% Reviewed medications with patient and discussed importance of medication adherence;        Reviewed prescribed diet with patient including a lean protein with each meal and snack; Counseled on importance of regular  laboratory monitoring as prescribed;        Reviewed scheduled/upcoming provider appointments including: reschedule with Nutrition and schedule PCP follow up;         Review of patient status, including review of consultants reports, relevant laboratory and other test results, and medications completed;       Assessed social determinant of health barriers;        Advised to reschedule missed appointment with Nutrition Advised patient to contact pharmacy for medication delivery-uses [facility] Collaborated with PCP for new referral for GI and foot exam with [place] provider-per patient request   Patient Goals/Self-Care Activities: Take medications as prescribed   Attend all scheduled provider appointments Call provider office for new concerns or questions  Work with the social worker to address care coordination needs and will continue to work with the clinical team to address health care and disease management related needs schedule appointment with eye doctor check feet daily for cuts, sores or redness drink 6 to 8 glasses of water each day fill half of plate with vegetables

## 2023-01-05 ENCOUNTER — Other Ambulatory Visit: Payer: Self-pay

## 2023-01-05 ENCOUNTER — Other Ambulatory Visit (HOSPITAL_COMMUNITY): Payer: Self-pay

## 2023-01-06 ENCOUNTER — Other Ambulatory Visit (HOSPITAL_COMMUNITY): Payer: Self-pay

## 2023-01-12 ENCOUNTER — Encounter: Payer: BLUE CROSS/BLUE SHIELD | Admitting: Student

## 2023-01-24 ENCOUNTER — Other Ambulatory Visit: Payer: Self-pay

## 2023-01-24 ENCOUNTER — Encounter: Payer: Self-pay | Admitting: Student

## 2023-01-24 ENCOUNTER — Ambulatory Visit (INDEPENDENT_AMBULATORY_CARE_PROVIDER_SITE_OTHER): Payer: Medicaid Other | Admitting: Student

## 2023-01-24 ENCOUNTER — Other Ambulatory Visit (HOSPITAL_COMMUNITY): Payer: Self-pay

## 2023-01-24 VITALS — BP 184/112 | HR 96 | Temp 97.5°F | Ht 66.0 in | Wt 182.5 lb

## 2023-01-24 DIAGNOSIS — I10 Essential (primary) hypertension: Secondary | ICD-10-CM

## 2023-01-24 DIAGNOSIS — K869 Disease of pancreas, unspecified: Secondary | ICD-10-CM

## 2023-01-24 DIAGNOSIS — E1169 Type 2 diabetes mellitus with other specified complication: Secondary | ICD-10-CM

## 2023-01-24 DIAGNOSIS — Z794 Long term (current) use of insulin: Secondary | ICD-10-CM | POA: Diagnosis not present

## 2023-01-24 LAB — POCT GLYCOSYLATED HEMOGLOBIN (HGB A1C): Hemoglobin A1C: 13.8 % — AB (ref 4.0–5.6)

## 2023-01-24 LAB — GLUCOSE, CAPILLARY: Glucose-Capillary: 447 mg/dL — ABNORMAL HIGH (ref 70–99)

## 2023-01-24 MED ORDER — AMLODIPINE-OLMESARTAN 10-40 MG PO TABS
1.0000 | ORAL_TABLET | Freq: Every day | ORAL | 3 refills | Status: DC
  Filled 2023-01-24: qty 90, 90d supply, fill #0
  Filled 2023-04-23: qty 30, 30d supply, fill #1
  Filled 2023-04-25: qty 60, 60d supply, fill #1
  Filled 2023-06-20: qty 90, 90d supply, fill #2

## 2023-01-24 MED ORDER — DEXCOM G7 SENSOR MISC
1.0000 | Freq: Every day | 1 refills | Status: DC
  Filled 2023-01-24: qty 9, 90d supply, fill #0
  Filled 2023-06-02 – 2023-06-03 (×2): qty 9, 90d supply, fill #1

## 2023-01-24 MED ORDER — INSULIN GLARGINE 100 UNIT/ML SOLOSTAR PEN
30.0000 [IU] | PEN_INJECTOR | Freq: Every day | SUBCUTANEOUS | 11 refills | Status: DC
Start: 2023-01-24 — End: 2024-04-10
  Filled 2023-01-24 – 2023-02-10 (×2): qty 15, 50d supply, fill #0
  Filled 2023-04-23: qty 15, 50d supply, fill #1
  Filled 2023-06-20: qty 15, 50d supply, fill #2
  Filled 2023-09-22: qty 15, 50d supply, fill #3
  Filled 2023-11-23: qty 15, 50d supply, fill #4
  Filled 2024-01-23: qty 15, 50d supply, fill #5

## 2023-01-24 NOTE — Progress Notes (Signed)
CC: Diabetes follow up  HPI:  [name] is a 62 y.o. male living with a history stated below and presents today for diabetes follow up. Please see problem based assessment and plan for additional details.  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 1 kit 1   buPROPion (WELLBUTRIN XL) 150 MG 24 hr tablet Take 1 tablet (150 mg total) by mouth daily. 90 tablet 1   cetaphil (CETAPHIL) lotion Apply 1 Application topically as needed for dry skin. (Patient not taking: Reported on 01/04/2023) 236 mL 0   Continuous Blood Gluc Receiver (DEXCOM G7 RECEIVER) DEVI Use as directed 1 each 0   insulin aspart (NOVOLOG FLEXPEN) 100 UNIT/ML FlexPen Inject 12 Units into the skin 3 (three) times daily with meals. (Patient not taking: Reported on 01/04/2023) 15 mL 11   No current facility-administered medications on file prior to visit.    Family History  Problem Relation Age of Onset   Hypertension Mother    Hypertension Sister    Cancer Neg Hx    Heart attack Neg Hx     Social History   Socioeconomic History   Marital status: Widowed    Spouse name: Not on file   Number of children: Not on file   Years of education: Not on file   Highest education level: Not on file  Occupational History   Not on file  Tobacco Use   Smoking status: Every Day    Packs/day: 0.50    Years: 25.00    Additional pack years: 0.00    Total  pack years: 12.50    Types: Cigarettes   Smokeless tobacco: Never  Substance and Sexual Activity   Alcohol use: No   Drug use: No   Sexual activity: Yes  Other Topics Concern   Not on file  Social History Narrative   Not on file   Social Determinants of Health   Financial Resource Strain: High Risk (09/23/2022)   Overall Financial Resource Strain (CARDIA)    Difficulty of Paying Living Expenses: Very hard  Food Insecurity: No Food Insecurity (01/24/2023)   Hunger Vital Sign    Worried About Running Out of Food in the Last Year: Never true    Ran Out of Food in the Last Year: Never true  Transportation Needs: Unmet Transportation Needs (01/04/2023)   PRAPARE - Administrator, Civil Service (Medical): Yes    Lack of Transportation (Non-Medical): Yes  Physical Activity: Not on file  Stress: Not on file  Social Connections: Not on file  Intimate Partner Violence: Not At Risk (01/24/2023)   Humiliation, Afraid, Rape, and Kick questionnaire    Fear of Current or Ex-Partner: No    Emotionally Abused: No    Physically Abused: No    Sexually Abused: No    Review of Systems: ROS negative except for what is noted on the assessment and  plan.  Vitals:   01/24/23 1022  BP: (!) 184/112  Pulse: 96  Temp: (!) 97.5 F (36.4 C)  TempSrc: Oral  SpO2: 97%  Weight: 182 lb 8 oz (82.8 kg)  Height: 5\' 6"  (1.676 m)    Physical Exam: Constitutional: well-appearing male  in no acute distress HENT: normocephalic atraumatic, mucous membranes moist Cardiovascular: regular rate and rhythm, no m/r/g, +2 pulses in dorsalis pedis and posterior tibial pulse Pulmonary/Chest: normal work of breathing on room air, lungs clear to auscultation bilaterally Abdominal: soft, non-tender, non-distended Neurological: alert & oriented x 3, 5/5 strength in bilateral upper and lower extremities, normal sensation in lower extremities bilaterally Skin: warm and dry   Assessment & Plan:   Diabetes  mellitus associated with pancreatic disease (HCC) Patient presents for follow up for his diabetes  Last A1c done in February was above 14, A1c done today is 13.8.  His current regimen is Lantus 15 units, and NovoLog 12 units with meals.  He states that 94% his glucometer states that he is high, and he is only average 4% of the time.  Of note, there were changes made to his regimen outside of his PCP,  as he was supposed to be on Tresiba 35 units, metformin 500 mg, and NovoLog 12 units.  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  Overall, he is far from being optimally controlled.  His insulin requirements were slowly going up before the switch was made to Lantus, so unsure as to why 15 units was decided upon.  He also is currently facing housing insecurity, and his diet is very poorly controlled.  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  Foot exam done today did not reveal any sensory deficits.  Plan: - Will increase Lantus to 30 units - Continue NovoLog 12 units - Will place referral to podiatry, his last referral patient did not have insurance - Will also place referral to ophthalmology - Will follow-up in a month  Essential hypertension Blood pressure elevated to systolic in the 180s at this visit.  At last visit in April blood pressure systolic was in the 130 range.  His medication regimen is amlodipine-olmesartan 10-40 mg.  He has been out of this medication for the last 3 days.  He does not check his blood pressure at home, encouraged him to check his blood pressure for free at [facility] or [facility].  Plan: - Amlodipine-olmesartan refill  Patient discussed with Dr. Pauline Good Rogue Pautler, M.D. [facility] Internal Medicine, PGY-1 Pager: [phone] Date 01/24/2023 Time  1:55 PM

## 2023-01-24 NOTE — Assessment & Plan Note (Addendum)
Patient presents for follow up for his diabetes  Last A1c done in February was above 14, A1c done today is 13.8.  His current regimen is Lantus 15 units, and NovoLog 12 units with meals.  He states that 94% his glucometer states that he is high, and he is only average 4% of the time.  Of note, there were changes made to his regimen outside of his PCP,  as he was supposed to be on Tresiba 35 units, metformin 500 mg, and NovoLog 12 units.  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  Overall, he is far from being optimally controlled.  His insulin requirements were slowly going up before the switch was made to Lantus, so unsure as to why 15 units was decided upon.  He also is currently facing housing insecurity, and his diet is very poorly controlled.  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  Foot exam done today did not reveal any sensory deficits.  Plan: - Will increase Lantus to 30 units - Continue NovoLog 12 units - Will place referral to podiatry, his last referral patient did not have insurance - Will also place referral to ophthalmology - Will follow-up in a month

## 2023-01-24 NOTE — Assessment & Plan Note (Signed)
Blood pressure elevated to systolic in the 180s at this visit.  At last visit in April blood pressure systolic was in the 130 range.  His medication regimen is amlodipine-olmesartan 10-40 mg.  He has been out of this medication for the last 3 days.  He does not check his blood pressure at home, encouraged him to [facility] his blood [facility].  Plan: - Amlodipine-olmesartan refill

## 2023-01-24 NOTE — Patient Instructions (Signed)
Thank you so much for coming to the clinic today!   I want you to start taking the Lantus 30 units at bedtime. I have also sent refills to the pharmacy of your blood pressure medications.   If you have any questions please feel free to the call the clinic at anytime at [phone]. It was a pleasure seeing you!  Best, Dr. Thomasene Ripple

## 2023-01-25 NOTE — Progress Notes (Signed)
Internal Medicine Clinic Attending  Case discussed with Dr. Nooruddin  At the time of the visit.  We reviewed the resident's history and exam and pertinent patient test results.  I agree with the assessment, diagnosis, and plan of care documented in the resident's note.  

## 2023-01-26 ENCOUNTER — Other Ambulatory Visit (HOSPITAL_COMMUNITY): Payer: Self-pay

## 2023-01-31 DIAGNOSIS — Z419 Encounter for procedure for purposes other than remedying health state, unspecified: Secondary | ICD-10-CM | POA: Diagnosis not present

## 2023-02-07 ENCOUNTER — Encounter: Payer: Self-pay | Admitting: *Deleted

## 2023-02-07 ENCOUNTER — Other Ambulatory Visit: Payer: BLUE CROSS/BLUE SHIELD | Admitting: *Deleted

## 2023-02-07 NOTE — Patient Instructions (Signed)
Visit Information  Ricky Boyle was given information about Medicaid Managed Care team care coordination services as a part of their [facility] Medicaid benefit. Ricky Boyle verbally consented to engagement with the [facility] Managed Care team.   If you are experiencing a medical emergency, please call 911 or report to your local emergency department or urgent care.   If you have a non-emergency medical problem during routine business hours, please contact your provider's office and ask to speak with a nurse.   For questions related to your [facility] health plan, please call: [phone] or go here:[email or web address]  If you would like to schedule transportation through your [facility] plan, please call the following number at least 2 days in advance of your appointment: [phone].   You can also use the MTM portal or MTM mobile app to manage your rides. Reimbursement for transportation is available through [facility]! For the portal, please go to [email or web address]  Call the [facility] Crisis Line at [phone], at any time, 24 hours a day, 7 days a week. If you are in danger or need immediate medical attention call 911.  If you would like help to quit smoking, call 1-800-QUIT-NOW (240-298-7089) OR Espaol: 1-855-Djelo-Ya (4-132-440-1027) o para ms informacin haga clic aqu or Text READY to 253-664 to register via text  Ricky Boyle,   Please see education materials related to Diabetes provided by MyChart link.  Patient verbalizes understanding of instructions and care plan provided today and agrees to view in MyChart. Active MyChart status and patient understanding of how to access instructions and care plan via MyChart confirmed with patient.     Telephone follow up appointment with Managed Medicaid care management team member scheduled for:04/11/23 @ 9am  [name] RN, BSN [place]  Managed [facility] RN Care  Coordinator [phone]   Following is a copy of your plan of care:  Care Plan : RN Care Manager Plan of Care  Updates made by [name], RN since 02/07/2023 12:00 AM     Problem: Health Management needs related to DM      Long-Range Goal: Development of Plan of Care to address Health Management needs related to DM   Start Date: 09/29/2022  Expected End Date: 04/11/2023  Note:   Current Barriers:  Chronic Disease Management support and education needs related to DM Ricky Boyle has prescribed insulin and is taking as instructed. Recent A1C 13.8, he has difficulty eating a carb modified diet due to housing insecurity. His daughter is helping him with housing and he plans to have a place of his own by the end of the month.  RNCM Clinical Goal(s):  Patient will verbalize understanding of plan for management of DM as evidenced by patient reports take all medications exactly as prescribed and will call provider for medication related questions as evidenced by patient reports and EMR documentation    attend all scheduled medical appointments:  02/11/23 with TFC, 02/14/23 with Cardiology and 02/23/23 with PCP as evidenced by provider documentation        work with Child psychotherapist to address needing vision and dental providers related to the management of DM as evidenced by review of EMR and patient or Child psychotherapist report     through collaboration with Medical illustrator, provider, and care team.   Interventions: Inter-disciplinary care team collaboration (see longitudinal plan of care) Evaluation of current treatment plan related to  self management and patient's adherence to plan as established by provider   Diabetes:  (Status:  Goal on Track (progressing): YES.) Long Term Goal   Lab Results  Component Value Date   HGBA1C 13.8 (A) 01/24/2023   @ Assessed patient's understanding of A1c goal: <7% Reviewed medications with patient and discussed importance of medication adherence;        Reviewed  prescribed diet with patient including a lean protein with each meal and snack; Counseled on importance of regular laboratory monitoring as prescribed;        Reviewed scheduled/upcoming provider appointments including: see list above;         Review of patient status, including review of consultants reports, relevant laboratory and other test results, and medications completed;       Assessed social determinant of health barriers;        Discussed up coming appointments and the importance in attending, patient will call today to arrange transportation Discussed healthy food options that would be easily stored Discussed CGM readings, provided encouragement to continue working towards improving readings   Patient Goals/Self-Care Activities: Take medications as prescribed   Attend all scheduled provider appointments Call provider office for new concerns or questions  Work with the social worker to address care coordination needs and will continue to work with the clinical team to address health care and disease management related needs schedule appointment with eye doctor check feet daily for cuts, sores or redness drink 6 to 8 glasses of water each day fill half of plate with vegetables

## 2023-02-07 NOTE — Patient Outreach (Signed)
Medicaid Managed Care   Nurse Care Manager Note  02/07/2023 Name:  Ricky Boyle MRN:  [id number] DOB:  07/14/1961  Ricky Boyle is an 62 y.o. year old male who is a primary patient of [name], MD.  The [facility] Managed Care Coordination team was consulted for assistance with:    DM  [name] was given information about Medicaid Managed Care Coordination team services today. [name] Patient agreed to services and verbal consent obtained.  Engaged with patient by telephone for follow up visit in response to provider referral for case management and/or care coordination services.   Assessments/Interventions:  Review of past medical history, allergies, medications, health status, including review of consultants reports, laboratory and other test data, was performed as part of comprehensive evaluation and provision of chronic care management services.  SDOH (Social Determinants of Health) assessments and interventions performed: SDOH Interventions    Flowsheet Row Patient Outreach Telephone from 02/07/2023 in [place] [facility] POP[facility]ALTH DEPARTMENT Office Visit from 01/24/2023 in [facility] Patient Outreach Telephone from 01/04/2023 in [place] POPULATION HEALTH DEPARTMENT Patient Outreach Telephone from 12/02/2022 in [place] POPULATION HEALTH DEPARTMENT Patient Outreach Telephone from 09/29/2022 in [place] POPULATION HEALTH DEPARTMENT Care Coordination from 09/23/2022 in [facility] Care Coordination  SDOH Interventions        Food Insecurity Interventions -- Intervention Not Indicated -- -- Intervention Not Indicated Intervention Not Indicated  Housing Interventions Intervention Not Indicated  [Patient's daughter is helping with housing. Should be in housing by the end of July] Intervention Not Indicated -- -- -- Other (Comment)  [employer] Homelessness Prevention Program]  Transportation Interventions -- --  Payor Benefit  [Advised to use [employer] transportation and contact pharmacy for prescription delivery] Intervention Not Indicated Intervention Not Indicated --  Financial Strain Interventions -- -- -- -- -- Other (Comment)  [pt has applied for Disability and MM,  consult with MM team]       Care Plan  Allergies  Allergen Reactions   Bee Venom Anaphylaxis   Shrimp [Shellfish Allergy] Anaphylaxis   Metformin And Related Other (See Comments)    Sharp kidney pains, body felt bad   Penicillins Other (See Comments)    Pt does not know what the reaction was    Medications Reviewed Today     Reviewed by [name], RN (Registered Nurse) on 02/07/23 at 0920  Med List Status: <None>   Medication Order Taking? Sig Documenting Provider Last Dose Status Informant  amLODipine-olmesartan (AZOR) 10-40 MG tablet [id number] Yes Take 1 tablet by mouth daily. Nooruddin, Jason Fila, MD Taking Active   Blood Glucose Monitoring Suppl [facility] VERIO) w/Device KIT [id number] Yes Use to check blood sugar. Belva Agee, MD Taking Active   buPROPion (WELLBUTRIN XL) 150 MG 24 hr tablet [id number] No Take 1 tablet (150 mg total) by mouth daily.  Patient not taking: Reported on 02/07/2023   Belva Agee, MD Not Taking Active   cetaphil (CETAPHIL) lotion [id number] No Apply 1 Application topically as needed for dry skin.  Patient not taking: Reported on 01/04/2023   Belva Agee, MD Not Taking Active   Continuous Blood Gluc Receiver [facility] G7 [place]) Hardie Pulley [id number] Yes Use as directed Belva Agee, MD Taking Active   Continuous Glucose Sensor (DEXCOM G7 SENSOR) MISC [id number] Yes Change every 10 days Nooruddin, Jason Fila, MD Taking Active   insulin aspart (NOVOLOG FLEXPEN) 100 UNIT/ML FlexPen [id number] Yes Inject 12 Units into the skin 3 (three) times daily  with meals. Belva Agee, MD Taking Active            Med Note ([name]   Mon Feb 07, 2023  9:20 AM) Taking 15 units  with meals  insulin glargine (LANTUS) 100 UNIT/ML Solostar Pen [id number] Yes Inject 30 Units into the skin at bedtime. 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 has prescribed insulin and is taking as instructed. Recent A1C 13.8, he has difficulty eating a carb modified diet due to housing insecurity. His daughter is helping him with housing and he plans to have a place of his own by the end of the month.  RNCM Clinical Goal(s):  Patient will verbalize understanding of plan for management of DM as evidenced by patient reports take all medications exactly as prescribed and will call provider for medication related questions as evidenced by patient reports and EMR documentation    attend all scheduled medical appointments:  02/11/23 with TFC, 02/14/23 with Cardiology and 02/23/23  with PCP as evidenced by provider documentation        work with Child psychotherapist to address needing vision and dental providers related to the management of DM as evidenced by review of EMR and patient or Child psychotherapist report     through collaboration with Medical illustrator, provider, and care team.   Interventions: Inter-disciplinary care team collaboration (see longitudinal plan of care) Evaluation of current treatment plan related to  self management and patient's adherence to plan as established by provider   Diabetes:  (Status: Goal on Track (progressing): YES.) Long Term Goal   Lab Results  Component Value Date   HGBA1C 13.8 (A) 01/24/2023   @ Assessed patient's understanding of A1c goal: <7% Reviewed medications with patient and discussed importance of medication adherence;        Reviewed prescribed diet with patient including a lean protein with each meal and snack; Counseled on importance of regular laboratory monitoring as prescribed;        Reviewed scheduled/upcoming provider appointments including: see list above;         Review of patient status, including review of consultants reports, relevant laboratory and other test results, and medications completed;  Assessed social determinant of health barriers;        Discussed up coming appointments and the importance in attending, patient will call today to arrange transportation Discussed healthy food options that would be easily stored Discussed CGM readings, provided encouragement to continue working towards improving readings   Patient Goals/Self-Care Activities: Take medications as prescribed   Attend all scheduled provider appointments Call provider office for new concerns or questions  Work with the social worker to address care coordination needs and will continue to work with the clinical team to address health care and disease management related needs schedule appointment with eye doctor check feet daily for cuts,  sores or redness drink 6 to 8 glasses of water each day fill half of plate with vegetables       Follow Up:  Patient agrees to Care Plan and Follow-up.  Plan: The Managed Medicaid care management team will reach out to the patient again over the next 60 days.  Date/time of next scheduled RN care management/care coordination outreach:  04/11/23 @ 9am  Ricky Emms RN, BSN [place]  Managed Medicaid RN Care Coordinator [phone]

## 2023-02-10 ENCOUNTER — Other Ambulatory Visit (HOSPITAL_COMMUNITY): Payer: Self-pay

## 2023-02-11 ENCOUNTER — Other Ambulatory Visit (HOSPITAL_COMMUNITY): Payer: Self-pay

## 2023-02-11 ENCOUNTER — Encounter: Payer: Self-pay | Admitting: Podiatry

## 2023-02-11 ENCOUNTER — Ambulatory Visit (INDEPENDENT_AMBULATORY_CARE_PROVIDER_SITE_OTHER): Payer: Medicaid Other | Admitting: Podiatry

## 2023-02-11 DIAGNOSIS — B351 Tinea unguium: Secondary | ICD-10-CM | POA: Diagnosis not present

## 2023-02-11 DIAGNOSIS — M79674 Pain in right toe(s): Secondary | ICD-10-CM | POA: Diagnosis not present

## 2023-02-11 DIAGNOSIS — M25879 Other specified joint disorders, unspecified ankle and foot: Secondary | ICD-10-CM | POA: Diagnosis not present

## 2023-02-11 DIAGNOSIS — M79675 Pain in left toe(s): Secondary | ICD-10-CM | POA: Diagnosis not present

## 2023-02-11 DIAGNOSIS — M2580 Other specified joint disorders, unspecified joint: Secondary | ICD-10-CM | POA: Insufficient documentation

## 2023-02-11 DIAGNOSIS — M779 Enthesopathy, unspecified: Secondary | ICD-10-CM

## 2023-02-11 NOTE — Progress Notes (Signed)
This patient presents to the office with chief complaint of long thick nails and diabetic feet.  This patient  says there  is   pain and discomfort in his feet under his big toe joints both feet.  He says the feet have been painful  for 2 months.  He says this is how long he has been diabetic.  This patient says there are long thick painful nails.  These nails are painful walking and wearing shoes.  Patient has no history of infection or drainage from both feet.  Patient is unable to  self treat his own nails . This patient presents  to the office today for treatment of the  long nails and a foot evaluation due to history of  diabetes.  General Appearance  Alert, conversant and in no acute stress.  Vascular  Dorsalis pedis and posterior tibial  pulses are palpable  bilaterally.  Capillary return is within normal limits  bilaterally. Temperature is within normal limits  bilaterally.  Neurologic  Senn-Weinstein monofilament wire test within normal limits  bilaterally. Muscle power within normal limits bilaterally.  Nails Thick disfigured discolored nails with subungual debris  from hallux to fifth toes bilaterally. No evidence of bacterial infection or drainage bilaterally.  Orthopedic  No limitations of motion of motion feet .  No crepitus or effusions noted.  No bony pathology or digital deformities noted. Palpable pain sesamoid apparatus  B/L.  No swelling or redness noted.  Skin  normotropic skin with no porokeratosis noted bilaterally.  No signs of infections or ulcers noted.     Onychomycosis  Diabetes with no foot complications  IE  Debride nails x 10.  A diabetic foot exam was performed and there is no evidence of any vascular or neurologic pathology.  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  Told him to buy thick soled shoes.   RTC 3 months.   Helane Gunther DPM

## 2023-02-14 ENCOUNTER — Ambulatory Visit: Payer: Medicaid Other | Admitting: Cardiovascular Disease

## 2023-02-21 ENCOUNTER — Other Ambulatory Visit (HOSPITAL_COMMUNITY): Payer: Self-pay

## 2023-02-23 ENCOUNTER — Ambulatory Visit: Payer: Medicaid Other | Admitting: Dietician

## 2023-02-23 ENCOUNTER — Ambulatory Visit: Payer: Medicaid Other | Admitting: Student

## 2023-02-23 ENCOUNTER — Other Ambulatory Visit (HOSPITAL_COMMUNITY): Payer: Self-pay

## 2023-02-23 ENCOUNTER — Encounter: Payer: Self-pay | Admitting: Student

## 2023-02-23 ENCOUNTER — Telehealth: Payer: Self-pay | Admitting: Dietician

## 2023-02-23 ENCOUNTER — Telehealth: Payer: Self-pay

## 2023-02-23 VITALS — BP 152/107 | HR 86 | Temp 98.2°F | Wt 188.9 lb

## 2023-02-23 DIAGNOSIS — K869 Disease of pancreas, unspecified: Secondary | ICD-10-CM

## 2023-02-23 DIAGNOSIS — E1169 Type 2 diabetes mellitus with other specified complication: Secondary | ICD-10-CM

## 2023-02-23 MED ORDER — INSULIN ASPART 100 UNIT/ML IJ SOLN
INTRAMUSCULAR | 11 refills | Status: DC
Start: 2023-02-23 — End: 2024-04-10
  Filled 2023-02-23: qty 20, 30d supply, fill #0
  Filled 2023-03-26 – 2023-03-30 (×2): qty 20, 30d supply, fill #1
  Filled 2023-05-20: qty 20, 30d supply, fill #2
  Filled 2023-06-20: qty 20, 30d supply, fill #3
  Filled 2023-07-29: qty 20, 30d supply, fill #4
  Filled 2023-08-30 – 2023-09-06 (×2): qty 20, 30d supply, fill #5
  Filled 2023-10-03: qty 20, 30d supply, fill #6
  Filled 2023-11-23: qty 20, 30d supply, fill #7
  Filled 2024-01-23: qty 20, 30d supply, fill #8

## 2023-02-23 MED ORDER — V-GO 20 20 UNIT/24HR KIT
1.0000 | PACK | Freq: Every day | 11 refills | Status: DC
Start: 2023-02-23 — End: 2023-02-24
  Filled 2023-02-23: qty 1, fill #0
  Filled 2023-02-23: qty 1, 30d supply, fill #0

## 2023-02-23 NOTE — Telephone Encounter (Signed)
Pharmacy Patient Advocate Encounter   Received notification from Physician's Office that prior authorization for V-GO 20 is required/requested.   Insurance verification completed.   The patient is insured through [facility] [place] .   Per test claim: PA submitted to [facility] Medicaid via CoverMyMeds Key/confirmation #/[id number] Status is pending

## 2023-02-23 NOTE — Telephone Encounter (Signed)
Notified Ricky Boyle that the Ricky Boyle is not covered by his insurance and that Ricky Boyle is. He is fine with going ahead with the Ricky insulin pump. He agreed to keep his appointment with Diabetes Educator tomorrow to discuss how he wants to use it to cover his food (assess carb knowledge) and any other questions or concerns he may have.  I also explained that he will have to be trained by a certified Ricky trainer and I can help him get schedule with one.

## 2023-02-23 NOTE — Telephone Encounter (Signed)
Pharmacy Patient Advocate Encounter  Received notification from [facility] Medicaid that Prior Authorization for V-GO 20 has been DENIED. Please advise how you'd like to proceed. Full denial letter will be uploaded to the media tab. See denial reason below.  REASON: Per the health plan's preferred drug list, at least 2 preferred drugs must be tried before requesting this drug or tell us why the member cannot try any preferred alternatives. Please send [place] supporting chart notes and lab results. Here is list of preferred alternatives: Omnipod 5 G6 Pods (5-Pack) / G6 Intro Kit / G7 Pods / G7 Intro Kit, Omnipod DASH Pods (5-Pack) / Intro Kit, Omnipod GO Pods. Note: Some preferred drug(s) may have quantity limits.  PA #/Case ID/Reference #: [id number]

## 2023-02-24 ENCOUNTER — Other Ambulatory Visit (HOSPITAL_COMMUNITY): Payer: Self-pay

## 2023-02-24 ENCOUNTER — Ambulatory Visit (INDEPENDENT_AMBULATORY_CARE_PROVIDER_SITE_OTHER): Payer: Medicaid Other | Admitting: Dietician

## 2023-02-24 DIAGNOSIS — K869 Disease of pancreas, unspecified: Secondary | ICD-10-CM

## 2023-02-24 DIAGNOSIS — E1169 Type 2 diabetes mellitus with other specified complication: Secondary | ICD-10-CM | POA: Diagnosis not present

## 2023-02-24 MED ORDER — OMNIPOD 5 G7 PODS (GEN 5) MISC
1.0000 | Freq: Every day | 3 refills | Status: DC
Start: 2023-02-24 — End: 2023-02-25
  Filled 2023-02-24 (×2): qty 10, 30d supply, fill #0

## 2023-02-24 MED ORDER — OMNIPOD 5 G7 INTRO (GEN 5) KIT
1.0000 | PACK | Freq: Every day | 0 refills | Status: DC
Start: 2023-02-24 — End: 2023-02-25
  Filled 2023-02-24 (×2): qty 1, 30d supply, fill #0

## 2023-02-24 MED ORDER — OMNIPOD 5 G7 PODS (GEN 5) MISC
1.0000 | Freq: Every day | 3 refills | Status: DC
Start: 2023-02-24 — End: 2023-02-24
  Filled 2023-02-24: qty 5, fill #0

## 2023-02-24 NOTE — Patient Instructions (Addendum)
Download calorie king app for carb counting  Please read booklets provided to you today  IF you get your pump, open intro kit, turn on PDM, register it and create a Podder account  Pound cake home work- can you tell me at your next visit.how many carbs in your likely portion of your favorite pound cake  I am out of the office next week.  Lupita Leash [phone]

## 2023-02-24 NOTE — Patient Instructions (Addendum)
Please return tomorrow to discuss your Diabetes Self Management Education & Support in more detail.  Thank you!  Lupita Leash [phone]

## 2023-02-24 NOTE — Progress Notes (Signed)
As Ricky Boyle was leaving he said he thinks his meal time insulin is too much. After reviewing his Continuous glucose monitoring Reports, it appears that his Lantus may need to be split to twice daily or change to [place]. Would use estimated TDD of 51.5 units daily(average of  actual and weight based TDD), 13 units twice daily of Lantus,  8 units Novolog for meals. Plan to discuss with doctors consideration of lowering mealtime insulin to 8 units three time daily before meals.   Diabetes Self-Management Education  Visit Type: Follow-up (1st after a brief initial visit)  Appt. Start Time: 1315 Appt. End Time: 1415  02/24/2023  [name], identified by name and date of birth, is a 62 y.o. male with a diagnosis of Diabetes:  .He says he has Type 1, but no immunologic tests noted.    ASSESSMENT  Estimated body mass index is 30.49 kg/m as calculated from the following:   Height as of 01/24/23: 5\' 6"  (1.676 m).   Weight as of 02/23/23: 188 lb 14.4 oz (85.7 kg). Wt Readings from Last 10 Encounters:  02/23/23 188 lb 14.4 oz (85.7 kg)  01/24/23 182 lb 8 oz (82.8 kg)  11/02/22 175 lb 8 oz (79.6 kg)  10/15/22 170 lb 8 oz (77.3 kg)  09/15/22 169 lb 12.8 oz (77 kg)  09/13/22 177 lb 14.6 oz (80.7 kg)  07/22/22 178 lb (80.7 kg)  08/27/21 208 lb (94.3 kg)  06/24/21 205 lb 8 oz (93.2 kg)  03/30/21 213 lb 3.2 oz (96.7 kg)      Diabetes Self-Management Education - 02/24/23 1400       Visit Information   Visit Type Follow-up   1st after a brief initial visit     Health Coping   How would you rate your overall health? Good      Dietary Intake   Breakfast plan to assess more at next visit    Beverage(s) coke zero, coffee      Patient Education   Healthy Eating Carbohydrate counting;Reviewed blood glucose goals for pre and post meals and how to evaluate the patients' food intake on their blood glucose level.;Food label reading, portion sizes and measuring food.    Medications Other  (comment);Reviewed patients medication for diabetes, action, purpose, timing of dose and side effects.   He is an appropriate insulin pump candidate. He is motivated and interested in Diabetes Self Management Education & Support. Request Omnipod 5 G7 Intro kit & Omnipod 5 G7 pods x 6 boxes(5 each) & 3 refills be ordered with 2 vials of rapid acting insulin   Monitoring Taught/discussed recording of test results and interpretation of SMBG.      Individualized Goals (developed by patient)   Nutrition Carb counting      Outcomes   Expected Outcomes Demonstrated interest in learning. Expect positive outcomes    Future DMSE 2 wks    Program Status Not Completed      Subsequent Visit   Since your last visit have you continued or begun to take your medications as prescribed? Yes    Since your last visit have you had your blood pressure checked? No    Since your last visit have you experienced any weight changes? --   we did not assess today as he was here yesterday   Since your last visit, are you checking your blood glucose at least once a day? Yes             Individualized  Plan for Diabetes Self-Management Training:   Learning Objective:  Patient will have a greater understanding of diabetes self-management. Patient education plan is to attend individual and/or group sessions per assessed needs and concerns.   Plan:   Patient Instructions  Download calorie king app for carb counting  Please read booklets provided to you today  IF you get your pump, open intro kit, turn on PDM, register it and create a podder account  Pound cake home work- can you tell me at your next visit.how many carbs in your likely portion of your favorite pound cake  I am out of the office next week.  Lupita Leash [phone]   Expected Outcomes:  Demonstrated interest in learning. Expect positive outcomes  Education material provided: Food label handouts, Carbohydrate counting sheet, and Diabetes  Resources  If problems or questions, patient to contact team via:  Phone and Email  Future DSME appointment: 2 wks  Norm Parcel, RD 02/24/2023 3:01 PM.

## 2023-02-24 NOTE — Progress Notes (Signed)
Lab Results  Component Value Date   HGBA1C 13.8 (A) 01/24/2023   HGBA1C >14.0 (A) 09/15/2022   HGBA1C 11.8 (H) 06/21/2021   HGBA1C 5.5 03/14/2021   HGBA1C 5.2 04/17/2020   Diabetes Self-Management Education  Visit Type: First/Initial (brief visit during doctor visit, patient request pump, insulin administration information)  Appt. Start Time: 1000 Appt. End Time: 1030  02/24/2023  [name], identified by name and date of birth, is a 62 y.o. male with a diagnosis of Diabetes: Type 1.   ASSESSMENT  Patient states he would like an insulin pump. Discussed Cecur simplicity and Vgo and he is willing to try either one, but neither is covered by his insurance. He is very motivated, asking appropriate questions.  He states he has Type 1 diabetes. Does not skip his insulin. He has not drank alcohol in over 10 years.    Diabetes Self-Management Education - 02/24/23 1400       Visit Information   Visit Type First/Initial   brief visit during doctor visit, patient request pump, insulin administration information     Initial Visit   Diabetes Type Type 1    Date Diagnosed 2022    Are you currently following a meal plan? Yes    What type of meal plan do you follow? fewer carbs, no sugar beverages    Are you taking your medications as prescribed? Yes      Health Coping   How would you rate your overall health? Good      Psychosocial Assessment   Patient Belief/Attitude about Diabetes Motivated to manage diabetes   is a good pump candidate, verbalizes willingness to learn, come to appointments, expectations are appropriate   What is the hardest part about your diabetes right now, causing you the most concern, or is the most worrisome to you about your diabetes?   Checking blood sugar;Getting support / problem solving;Taking/obtaining medications    Self-care barriers Lack of material resources    Self-management support Doctor's office;Family;CDE visits    Patient Concerns  Medication    Special Needs None    Preferred Learning Style No preference indicated      Pre-Education Assessment   Patient understands the diabetes disease and treatment process. Comprehends key points    Patient understands incorporating nutritional management into lifestyle. Needs Review    Patient undertands incorporating physical activity into lifestyle. Comprehends key points    Patient understands using medications safely. Needs Review    Patient understands monitoring blood glucose, interpreting and using results Needs Review    Patient understands prevention, detection, and treatment of acute complications. Needs Review    Patient understands prevention, detection, and treatment of chronic complications. Compreheands key points    Patient understands how to develop strategies to address psychosocial issues. Comprehends key points    Patient understands how to develop strategies to promote health/change behavior. Demonstrates understanding / competency      Complications   Last HgB A1C per patient/outside source 13.8 %    How often do you check your blood sugar? > 4 times/day    Fasting Blood glucose range (mg/dL) 55-732    Postprandial Blood glucose range (mg/dL) 202-542    Number of hypoglycemic episodes per month --   need to assess   Number of hyperglycemic episodes ( >200mg /dL): Weekly    Can you tell when your blood sugar is high? Yes    What do you do if your blood sugar is high? drinks water  Have you had a dilated eye exam in the past 12 months? Yes    Have you had a dental exam in the past 12 months? No    Are you checking your feet? Yes      Dietary Intake   Breakfast coffee- 4 cups a day    Lunch peanut butter and jelly sandwich    Beverage(s) cole zero      Activity / Exercise   Activity / Exercise Type Light (walking / raking leaves);ADL's    How many days per week do you exercise? --   assess in more detail at follow up     Patient Education   Previous  Diabetes Education Yes (please comment)   in hopsital and reads online   Healthy Eating Meal timing in regards to the patients' current diabetes medication.    Medications Reviewed patients medication for diabetes, action, purpose, timing of dose and side effects.;Reviewed medication adjustment guidelines for hyperglycemia and sick days.    Monitoring Identified appropriate SMBG and/or A1C goals.;Taught/discussed recording of test results and interpretation of SMBG.    Acute complications Discussed and identified patients' prevention, symptoms, and treatment of hyperglycemia.;Taught prevention, symptoms, and  treatment of hypoglycemia - the 15 rule.;Educated on sick day management      Individualized Goals (developed by patient)   Medications take my medication as prescribed      Post-Education Assessment   Patient understands incorporating nutritional management into lifestyle. Needs Review    Patient understands using medications safely. Needs Review    Patient understands monitoring blood glucose, interpreting and using results Needs Review    Patient understands prevention, detection, and treatment of acute complications. Needs Review      Outcomes   Expected Outcomes Demonstrated interest in learning. Expect positive outcomes    Future DMSE Other (comment)   tomorrow   Program Status Not Completed             Individualized Plan for Diabetes Self-Management Training:   Learning Objective:  Patient will have a greater understanding of diabetes self-management. Patient education plan is to attend individual and/or group sessions per assessed needs and concerns.   Plan:   Patient Instructions  Please return tomorrow to discuss your Diabetes Self Management Education & Support in more detail.  Thank you!  Lupita Leash [phone]   Expected Outcomes:  Demonstrated interest in learning. Expect positive outcomes  Education material provided: Diabetes Resources  If problems or  questions, patient to contact team via:  Phone and Email  Future DSME appointment: Other (comment) (tomorrow) Norm Parcel, RD 02/24/2023 2:32 PM.

## 2023-02-24 NOTE — Addendum Note (Signed)
Addended by: [name] on: 02/24/2023 03:29 PM   Modules accepted: Orders

## 2023-02-25 ENCOUNTER — Other Ambulatory Visit (HOSPITAL_COMMUNITY): Payer: Self-pay

## 2023-02-25 MED ORDER — OMNIPOD 5 DEXG7G6 PODS GEN 5 MISC
1.0000 | Freq: Every day | 3 refills | Status: DC
Start: 2023-02-25 — End: 2023-06-07
  Filled 2023-02-25: qty 10, fill #0
  Filled 2023-04-23: qty 10, 30d supply, fill #0
  Filled 2023-06-02: qty 10, 30d supply, fill #1
  Filled 2023-06-03: qty 5, 15d supply, fill #1

## 2023-02-25 MED ORDER — OMNIPOD 5 DEXG7G6 INTRO GEN 5 KIT
1.0000 | PACK | Freq: Every day | 0 refills | Status: AC
  Filled 2023-02-25: qty 1, 30d supply, fill #0

## 2023-02-25 NOTE — Progress Notes (Signed)
Patient not seen as he needed to leave early. Visit canceled.

## 2023-02-25 NOTE — Addendum Note (Signed)
Addended by: [name] on: 02/25/2023 04:29 PM   Modules accepted: Orders

## 2023-02-28 ENCOUNTER — Telehealth: Payer: Self-pay

## 2023-02-28 NOTE — Progress Notes (Signed)
   02/28/2023  Patient ID: Ricky Boyle, male   DOB: 1960/09/16, 62 y.o.   MRN: [id number]  S/O Patient outreach to verify Ricky Boyle picked up Lantus and check on diabetes control  DM -Patient picked up lantus and is currently using 25 units daily; he is also using Novolog 15 units TID with meals -Patient states over the past 14 days he has been 23% in range, 53% very high, 23% high- with an average bg of 256 -He states his bedtime BG was 117 last night, and FBG this morning 215.  Of note, he takes his Lantus at 9pm. -He had 1 hypoglycemic occurrence at 32, and his Dexcom sensor alerted during the night while sleeping -He does endorses a better diet as of late -Patient states he will soon be starting omnipod for Novolog insulin administration; he is waiting to get set up for training and to see endocrinology  A/P  DM -Recommending taking Lantus in the morning to prevent going low at night while sleeping since bedtime BG seems to be lower than fasting -Unclear when patient is to be seen by endocrinology  Follow-up:  Ricky Boyle to follow-up upon return  Ricky Boyle, PharmD, DPLA

## 2023-03-03 DIAGNOSIS — Z419 Encounter for procedure for purposes other than remedying health state, unspecified: Secondary | ICD-10-CM | POA: Diagnosis not present

## 2023-03-04 ENCOUNTER — Other Ambulatory Visit (HOSPITAL_COMMUNITY): Payer: Self-pay

## 2023-03-10 ENCOUNTER — Encounter: Payer: Medicaid Other | Admitting: Internal Medicine

## 2023-03-10 ENCOUNTER — Encounter: Payer: Self-pay | Admitting: Dietician

## 2023-03-10 ENCOUNTER — Other Ambulatory Visit: Payer: Self-pay | Admitting: Dietician

## 2023-03-10 DIAGNOSIS — E1169 Type 2 diabetes mellitus with other specified complication: Secondary | ICD-10-CM

## 2023-03-10 MED ORDER — DEXCOM G6 RECEIVER DEVI
0 refills | Status: DC
Start: 2023-03-10 — End: 2023-04-19
  Filled 2023-03-10: qty 1, 30d supply, fill #0
  Filled 2023-03-26: qty 1, 1d supply, fill #0
  Filled 2023-03-29: qty 1, 90d supply, fill #0

## 2023-03-10 MED ORDER — DEXCOM G6 TRANSMITTER MISC
3 refills | Status: AC
Start: 2023-03-10 — End: ?
  Filled 2023-03-10: qty 1, 30d supply, fill #0
  Filled 2023-03-26 – 2023-03-29 (×2): qty 1, 90d supply, fill #0
  Filled 2023-06-02 – 2023-07-07 (×2): qty 1, 90d supply, fill #1
  Filled 2023-10-03: qty 1, 90d supply, fill #2
  Filled 2024-01-23: qty 1, 90d supply, fill #3

## 2023-03-10 MED ORDER — DEXCOM G6 SENSOR MISC
11 refills | Status: DC
Start: 2023-03-10 — End: 2023-06-09
  Filled 2023-03-10 – 2023-03-29 (×3): qty 3, 30d supply, fill #0
  Filled 2023-04-23: qty 3, 30d supply, fill #1
  Filled 2023-05-30: qty 9, 90d supply, fill #0

## 2023-03-10 NOTE — Progress Notes (Deleted)
T2DM Last HbA1c 13.8 01/2023. At that OV, medication changes included increasing lantus to 30 units daily, continuing novolog 12 units 12 units TID with meals. He has been giving himself lantus *** units daily and novolog *** TID with meals. He does has a glucometer which shows he is very high ***%, high ***%, IN RANGE ***%, low ***%, and very low ***%. At last OV, he was in the "high" range 94% of the time and IN RANGE 4% of the time. He has an insulin pump ordered***. Note from prior OV discusses that another provider adjusted his diabetes regimen to include tresiba 35 units daily, metformin 500 mg daily, novolog 12 units TID with meals. However he did not tolerate metformin, and due to pancreatic insufficiency farxiga which he was previously on was stopped. Plan: Increase lantus to *** units daily. Continue novolog 12 units TID with meals.***   - Will place referral to podiatry, his last referral patient did not have insurance - Will also place referral to ophthalmology - Will follow-up in a month  HTN    Essential hypertension BP ***, on repeat ***. OP regimen is amlodipine-olmesartan 10-40 mg daily. He did*** take this medication today. He does not*** check his BP at home.  Plan:

## 2023-03-10 NOTE — Telephone Encounter (Signed)
Needs dexcom G6 to use Omnipod 5 in automatic mode. Patient to let me know if he has any problems getting his insurance to cover this.

## 2023-03-11 ENCOUNTER — Other Ambulatory Visit (HOSPITAL_COMMUNITY): Payer: Self-pay

## 2023-03-23 ENCOUNTER — Telehealth: Payer: Self-pay

## 2023-03-23 ENCOUNTER — Other Ambulatory Visit (HOSPITAL_COMMUNITY): Payer: Self-pay

## 2023-03-23 NOTE — Telephone Encounter (Signed)
Prior Authorization for patient (Dexcom G6 Sensor) came through on cover my meds was submitted with last office notes and labs awaiting approval or denial.  KEY:[id number]

## 2023-03-23 NOTE — Telephone Encounter (Signed)
Decision: Ricky Boyle (Key: [id number]) Rx #: [id number] Need Help? Call us at [phone] Outcome Additional Information Required Prior Authorization Not Required Drug Dexcom G6 Receiver device ePA cloud logo Form [facility] Medicaid of [facility] Electronic Prior Authorization Request Form [phone] NCPDP) Original Claim Info 75 Submit 3 DS For Emerg Fill with PA Type01, PA Number 1111, Level of Service 3FOR EMRG OVD, SCC=13 PER RPH DISCRETION

## 2023-03-23 NOTE — Telephone Encounter (Signed)
Call to pharmacy, Dexcom G6 receiver, transmitter and sensors need a prior auth. They will be sending that to [place] in a few minutes. He needs the Dexcom G6 to run his Onmipod 5 insulin pump in automatic mode(preferred). He will need this by Tuesday August 27 for his Omnipod 5 insulin pump training.

## 2023-03-23 NOTE — Telephone Encounter (Signed)
Authorizations have been submitted and approved. I called the patient to let him know the authorizations have been approved, patient is aware. Thanks Lupita Leash!

## 2023-03-23 NOTE — Telephone Encounter (Signed)
Prior Authorization for patient (Dexcom G6 Transmitter) came through on cover my meds was submitted with last office notes and labs awaiting approval or denial  KEY: [id number]

## 2023-03-23 NOTE — Telephone Encounter (Signed)
Decision:Approved [name] (Key: [id number]) PA Case ID #: [id number] Rx #: [id number] Need Help? Call us at [phone] Outcome Approved today by [facility] Medicaid 2017 Approved. This drug has been approved. Approved quantity: 3 units per 30 day(s). The drug has been approved from 03/09/2023 to 03/22/2024. Please call the pharmacy to process your prescription claim. Generic or biosimilar substitution may be required when available and preferred on the formulary. Authorization Expiration Date: 03/22/2024 Drug Dexcom G6 Sensor ePA cloud logo Form [facility] Medicaid of [facility] Electronic Prior Authorization Request Form [phone] NCPDP)

## 2023-03-23 NOTE — Telephone Encounter (Signed)
Decision: Ricky Boyle (Key: [id number]) Rx #: [id number] Need Help? Call us at [phone] Outcome Additional Information Required Prior Authorization Not Required Drug Dexcom G6 Transmitter ePA cloud logo Form [facility] Medicaid of [facility] Electronic Prior Authorization Request Form [id number] NCPDP)

## 2023-03-23 NOTE — Telephone Encounter (Signed)
Prior Authorization for patient (Dexcom G6 Receiver) came through on cover my meds was submitted with last office notes anda labs awaiting approval or denial.  AOZ:[id number]

## 2023-03-26 ENCOUNTER — Other Ambulatory Visit (HOSPITAL_COMMUNITY): Payer: Self-pay

## 2023-03-28 ENCOUNTER — Telehealth: Payer: Self-pay | Admitting: Dietician

## 2023-03-28 NOTE — Telephone Encounter (Signed)
Reminded patient of pump training appointment tomorrow and what to bring with him. He verbalized understanding.

## 2023-03-29 ENCOUNTER — Ambulatory Visit: Payer: Medicaid Other | Admitting: Dietician

## 2023-03-29 ENCOUNTER — Encounter: Payer: Self-pay | Admitting: Student

## 2023-03-29 ENCOUNTER — Ambulatory Visit (INDEPENDENT_AMBULATORY_CARE_PROVIDER_SITE_OTHER): Payer: Medicaid Other | Admitting: Student

## 2023-03-29 ENCOUNTER — Other Ambulatory Visit: Payer: Self-pay

## 2023-03-29 ENCOUNTER — Other Ambulatory Visit (HOSPITAL_COMMUNITY): Payer: Self-pay

## 2023-03-29 VITALS — BP 156/117 | HR 98 | Temp 98.2°F | Ht 66.0 in | Wt 184.8 lb

## 2023-03-29 DIAGNOSIS — K869 Disease of pancreas, unspecified: Secondary | ICD-10-CM | POA: Diagnosis not present

## 2023-03-29 DIAGNOSIS — F17210 Nicotine dependence, cigarettes, uncomplicated: Secondary | ICD-10-CM | POA: Diagnosis not present

## 2023-03-29 DIAGNOSIS — E0842 Diabetes mellitus due to underlying condition with diabetic polyneuropathy: Secondary | ICD-10-CM

## 2023-03-29 DIAGNOSIS — E1169 Type 2 diabetes mellitus with other specified complication: Secondary | ICD-10-CM

## 2023-03-29 DIAGNOSIS — E0869 Diabetes mellitus due to underlying condition with other specified complication: Secondary | ICD-10-CM

## 2023-03-29 DIAGNOSIS — E0840 Diabetes mellitus due to underlying condition with diabetic neuropathy, unspecified: Secondary | ICD-10-CM | POA: Insufficient documentation

## 2023-03-29 DIAGNOSIS — I10 Essential (primary) hypertension: Secondary | ICD-10-CM

## 2023-03-29 DIAGNOSIS — Z794 Long term (current) use of insulin: Secondary | ICD-10-CM

## 2023-03-29 MED ORDER — PREGABALIN 25 MG PO CAPS
25.0000 mg | ORAL_CAPSULE | Freq: Three times a day (TID) | ORAL | 2 refills | Status: DC
Start: 2023-03-29 — End: 2023-04-06
  Filled 2023-03-29 – 2023-03-30 (×2): qty 90, 30d supply, fill #0

## 2023-03-29 MED ORDER — GABAPENTIN 300 MG PO CAPS
300.0000 mg | ORAL_CAPSULE | Freq: Three times a day (TID) | ORAL | 2 refills | Status: DC
  Filled 2023-03-29: qty 90, 30d supply, fill #0

## 2023-03-29 NOTE — Assessment & Plan Note (Signed)
Blood pressure notably elevated at 156/117.  His current regimen is amlodipine-olmesartan 10-40.  He did not take his medication this morning in anticipation of the OmniPod placement.

## 2023-03-29 NOTE — Assessment & Plan Note (Signed)
Patient complaining of lower extremity pain bilaterally.  He describes the pain as burning sensation with pins-and-needles.  He has not tried any medication for this before besides gabapentin which she does not remember why he could not tolerate it.  He has adequate sensation in his lower extremities, and no ulcers were seen.  Vasculature intact as well.  Given his uncontrolled diabetes, I believe this is most consistent with neuropathic pain.  Since he has not been able to tolerate gabapentin we will trial Lyrica.  Informed patient to start taking 1 at night c/o it works for him, and if needed can go to 3 times a day.  Plan: - Lyrica 25 mg 3 times daily

## 2023-03-29 NOTE — Assessment & Plan Note (Signed)
A1c from 2 months ago at 13.8.  At that time Lantus was increased to 30 units, and NovoLog 12 units with meals.  He states that the Lantus 30 units was giving him low-dose of 50/60 range, so he reduced it himself to 20 units.  On review of glucometer average glucose is 312, and he is high 82% of the time.  His current work schedule makes taking the insulin throughout the day difficult for him, as he has missed many doses.  He is now being fitted for an OmniPod, and I do believe that this will help get his glucose under control.  Plan: - OmniPod placement - Follow-up in 1 month to review sugars

## 2023-03-29 NOTE — Progress Notes (Signed)
CC: Diabetes follow-up  HPI:  Ricky Boyle is a 62 y.o. male living with a history stated below and presents today for diabetes follow-up. Please see problem based assessment and plan for additional details.  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 90 tablet 3   Blood Glucose Monitoring Suppl (ONETOUCH VERIO) w/Device KIT Use to check blood sugar. 1 kit 1   buPROPion (WELLBUTRIN XL) 150 MG 24 hr tablet Take 1 tablet (150 mg total) by mouth daily. (Patient not taking: Reported on 02/07/2023) 90 tablet 1   cetaphil (CETAPHIL) lotion Apply 1 Application topically as needed for dry skin. (Patient not taking: Reported on 01/04/2023) 236 mL 0   Continuous Glucose Receiver (DEXCOM G6 RECEIVER) DEVI Use to monitor your blood glucose continuously 1 each 0   Continuous Glucose Sensor (DEXCOM G6 SENSOR) MISC Apply new sensor every 10 days, Use to monitor your blood glucose continuously 3 each 11   Continuous Glucose Sensor (DEXCOM G7 SENSOR) MISC Change every 10 days 9 each 1   Continuous Glucose Transmitter (DEXCOM G6 TRANSMITTER) MISC Use for 90 days with Dexcom G6 sensors, Use to monitor your blood glucose continuously 1 each 3   insulin aspart (NOVOLOG FLEXPEN) 100 UNIT/ML FlexPen Inject 12 Units into the skin 3 (three) times daily with meals. 15 mL 11   insulin aspart (NOVOLOG) 100 UNIT/ML injection Use to fill V-GO insulin pump 20 mL 11    Insulin Disposable Pump (OMNIPOD 5 G6 INTRO, GEN 5,) KIT Apply and use to administer up to 70 units of insulin daily as instructed 1 kit 0   Insulin Disposable Pump (OMNIPOD 5 G6 PODS, GEN 5,) MISC Apply and use to administer up to 70 units of insulin daily as instructed 6 each 3   insulin glargine (LANTUS) 100 UNIT/ML Solostar Pen Inject 30 Units into the skin at bedtime. 15 mL 11   No current facility-administered medications on file prior to visit.    Family History  Problem Relation Age of Onset   Hypertension Mother    Hypertension Sister    Cancer Neg Hx    Heart attack Neg Hx     Social History   Socioeconomic History   Marital status: Widowed    Spouse name: Not on file   Number of children: Not on file   Years of education: Not on file   Highest education level: Not on file  Occupational History   Not on file  Tobacco Use   Smoking status: Every Day    Current packs/day: 0.50    Average packs/day: 0.5 packs/day for 25.0 years (12.5 ttl pk-yrs)    Types: Cigarettes   Smokeless tobacco: Never  Substance and Sexual Activity   Alcohol use: No   Drug use: No   Sexual activity: Yes  Other Topics Concern   Not on file  Social History Narrative   Not on file  Social Determinants of Health   Financial Resource Strain: High Risk (09/23/2022)   Overall Financial Resource Strain (CARDIA)    Difficulty of Paying Living Expenses: Very hard  Food Insecurity: No Food Insecurity (01/24/2023)   Hunger Vital Sign    Worried About Running Out of Food in the Last Year: Never true    Ran Out of Food in the Last Year: Never true  Transportation Needs: Unmet Transportation Needs (01/04/2023)   PRAPARE - Administrator, Civil Service (Medical): Yes    Lack of Transportation (Non-Medical): Yes  Physical Activity: Not on file  Stress: Not on file  Social Connections: Not on file  Intimate Partner Violence: Not At Risk (01/24/2023)   Humiliation, Afraid, Rape, and Kick  questionnaire    Fear of Current or Ex-Partner: No    Emotionally Abused: No    Physically Abused: No    Sexually Abused: No    Review of Systems: ROS negative except for what is noted on the assessment and plan.  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  At that time Lantus was increased to 30 units, and NovoLog 12 units with meals.  He states that the Lantus 30 units was giving him low-dose of 50/60 range, so he reduced it himself to 20 units.  On review of glucometer average glucose is 312, and he is high 82% of the time.  His current work schedule makes taking the insulin throughout the day difficult for him, as he has missed many doses.  He is now being fitted for an OmniPod, and I do believe that this will help get his glucose under control.  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  He describes the pain as burning sensation with pins-and-needles.  He has not tried any medication for this before besides gabapentin which she does not remember why he could not tolerate it.  He has adequate sensation in his lower extremities, and no ulcers were seen.  Vasculature intact as well.  Given his uncontrolled diabetes, I  believe this is most consistent with neuropathic pain.  Since he has not been able to tolerate gabapentin we will trial Lyrica.  Informed patient to start taking 1 at night c/o it works for him, and if needed can go to 3 times a day.  Plan: - Lyrica 25 mg 3 times daily  Essential hypertension Blood pressure notably elevated at 156/117.  His current regimen is amlodipine-olmesartan 10-40.  He did not take his medication this morning in anticipation of the OmniPod placement.  Patient discussed with Dr. Kae Heller Karmin Kasprzak, M.D. [facility] Internal Medicine, PGY-2 Pager: [phone] Date 03/29/2023 Time 11:31 AM

## 2023-03-29 NOTE — Progress Notes (Signed)
Insulin pump training on Omnipod 5 with [employer] insulin pump educator: Randall An, RN, MSN, CDCES  Ricky Boyle was started on Dexcom G6 by myself today and trained on pump and integration with Dexcom G6 by Trula Ore. He verbalized good understanding to insulin pump therapy.   His initial pump settings ar as follows:  Basal rate 12 am to 12 am- 0.96 u/hour Insulin to carb ratio- 1 units to 10 grams Correction factor 12 am to 12 am -37 Max basal rate-3.0u/hr  Max bolus- 35 units Target glucose 12 am to 7 am 120 mg/dL correct above 962 mg/dl                            7 am to 12 am 110 mg/dL correct above 952 mg/dL Duration of insulin action - 4 hours Reverse correction is ON  Follow up will be done by phone with Ricky Boyle in 1 and 2 weeks and myself in office in 4 weeks.  Ricky Boyle, RD 03/29/2023 1:22 PM.

## 2023-03-29 NOTE — Patient Instructions (Addendum)
Thank you so much for coming to the clinic today!   For your diabetes, I am very happy that getting the OmniPod placed today.  Lets touch base in a month to see how everything is going with it.  For the pain I have sent in a medication called lyrica to the pharmacy.  You can take it 3 times a day, but I would recommend taking it once a day at night first and see how you do with it and then slowly go up if you need to.  If you have any questions please feel free to the call the clinic at anytime at [phone]. It was a pleasure seeing you!  Best, Dr. Thomasene Ripple

## 2023-03-30 ENCOUNTER — Telehealth: Payer: Self-pay | Admitting: Dietician

## 2023-03-30 ENCOUNTER — Other Ambulatory Visit (HOSPITAL_COMMUNITY): Payer: Self-pay

## 2023-03-30 NOTE — Telephone Encounter (Signed)
Called Ricky Boyle to follow up on insulin pump start. Ricky Viana says " I'm doing great. Since I started using the pump my legs have stopped hurting." When asked if he feels comfortable carb counting, he responded "yes, I just Google what I am eating and put it in". He verbalized that he will be changing his pod for the first time Friday. He has the pump trainer's phone number for questions.  Review of his Dexcom shows average 191 since yesterday, 45% in target range and GMI 7.9%.

## 2023-03-31 ENCOUNTER — Telehealth: Payer: Self-pay | Admitting: Dietician

## 2023-03-31 NOTE — Progress Notes (Signed)
Internal Medicine Clinic Attending  Case discussed with the resident at the time of the visit.  We reviewed the resident's history and exam and pertinent patient test results.  I agree with the assessment, diagnosis, and plan of care documented in the resident's note.  

## 2023-03-31 NOTE — Telephone Encounter (Signed)
Ricky Boyle called to ask if he should be taking his boluses before eating like he did with his injections. I advised 10-20 minutes before. He said he was thrilled to wake up to a blood sugar of 110 this am.

## 2023-04-03 DIAGNOSIS — Z419 Encounter for procedure for purposes other than remedying health state, unspecified: Secondary | ICD-10-CM | POA: Diagnosis not present

## 2023-04-05 ENCOUNTER — Other Ambulatory Visit (HOSPITAL_COMMUNITY): Payer: Self-pay

## 2023-04-05 ENCOUNTER — Telehealth: Payer: Self-pay | Admitting: *Deleted

## 2023-04-05 NOTE — Telephone Encounter (Signed)
Call from patient has stopped taking the Lyrica as it causes his heart to Flutter.  Stopped taking 2 days ago and the fluttering stopped.  Would like to get something else for his pain.  Also said that he cannot take Gabapentin as it causes his heart to Flutter as well.  Would like for prescription to be sent to the [facility].

## 2023-04-06 ENCOUNTER — Other Ambulatory Visit: Payer: Self-pay | Admitting: Student

## 2023-04-06 ENCOUNTER — Other Ambulatory Visit (HOSPITAL_COMMUNITY): Payer: Self-pay

## 2023-04-06 ENCOUNTER — Telehealth: Payer: Self-pay | Admitting: Student

## 2023-04-06 MED ORDER — DULOXETINE HCL 30 MG PO CPEP
30.0000 mg | ORAL_CAPSULE | Freq: Every day | ORAL | 3 refills | Status: DC
  Filled 2023-04-06: qty 90, 90d supply, fill #0

## 2023-04-06 NOTE — Telephone Encounter (Signed)
Called and addressed with the patient! Thank you!

## 2023-04-06 NOTE — Progress Notes (Signed)
Patient was recently seen in the clinic for his diabetes, and subsequent neuropathic pain.  He states that the Lyrica that was prescribed to him was causing his "heart to flutter".  He states this is only happened before, when he tried to take gabapentin.  He denies any shortness of breath, or chest pain.  Subsequently he has not taken the Lyrica.  Given his significantly elevated A1c, and examination on last visit his pain is consistent with neuropathic pain.  Will discontinue Lyrica and try duloxetine.  Advised patient of side effects including sexual dysfunction, mood changes.  Last BMP in March showed normal kidney function.  At this point, running out of options for neuropathic pain relief expressed this to the patient.   Plan: - Trial Cymbalta 30 mg daily, can uptitrate if necessary

## 2023-04-06 NOTE — Telephone Encounter (Signed)
Pt calling back. Pt states no one has called him back about the pregabalin (LYRICA) 25 MG capsule that he stopped 2 days ago due to heart flutters.  Pt states he needs to know what to do and if another medication is going to be called in to   [facility] AT [place]

## 2023-04-11 ENCOUNTER — Other Ambulatory Visit: Payer: Medicaid Other | Admitting: *Deleted

## 2023-04-11 NOTE — Patient Instructions (Signed)
Visit Information  Ricky Boyle was given information about Medicaid Managed Care team care coordination services as a part of their [facility] Medicaid benefit. Ricky Boyle verbally consented to engagement with the [facility] Managed Care team.   If you are experiencing a medical emergency, please call 911 or report to your local emergency department or urgent care.   If you have a non-emergency medical problem during routine business hours, please contact your provider's office and ask to speak with a nurse.   For questions related to your [facility] health plan, please call: [phone] or go here:[email or web address]  If you would like to schedule transportation through your [facility] plan, please call the following number at least 2 days in advance of your appointment: [phone].   You can also use the MTM portal or MTM mobile app to manage your rides. Reimbursement for transportation is available through [facility]! For the portal, please go to [email or web address]  Call the [facility] Crisis Line at [phone], at any time, 24 hours a day, 7 days a week. If you are in danger or need immediate medical attention call 911.  If you would like help to quit smoking, call 1-800-QUIT-NOW (336-628-9831) OR Espaol: 1-855-Djelo-Ya (8-841-660-6301) o para ms informacin haga clic aqu or Text READY to 601-093 to register via text  Ricky Boyle,   Please see education materials related to HTN provided by MyChart link.  Patient verbalizes understanding of instructions and care plan provided today and agrees to view in MyChart. Active MyChart status and patient understanding of how to access instructions and care plan via MyChart confirmed with patient.     Telephone follow up appointment with Managed Medicaid care management team member scheduled for:06/13/23 at 9am  Ricky Emms RN, BSN Ricky Boyle  Value-Based Care [facility] RN Care  Coordinator [phone]   Following is a copy of your plan of care:  Care Plan : RN Care Manager Plan of Care  Updates made by [name], RN since 04/11/2023 12:00 AM     Problem: Health Management needs related to DM      Long-Range Goal: Development of Plan of Care to address Health Management needs related to DM   Start Date: 09/29/2022  Expected End Date: 06/13/2023  Note:   Current Barriers:  Chronic Disease Management support and education needs related to DM Ricky Boyle is now using the Omnipod for insulin administration. He reports improved BS readings with an average today being 120. Recent elevated BP in the office, patient reports not taking medication prior to his appointment.  RNCM Clinical Goal(s):  Patient will verbalize understanding of plan for management of DM as evidenced by patient reports take all medications exactly as prescribed and will call provider for medication related questions as evidenced by patient reports and EMR documentation    attend all scheduled medical appointments:  04/21/23 Cardiology and 04/28/23 with PCP as evidenced by provider documentation        work with Child psychotherapist to address needing vision and dental providers related to the management of DM as evidenced by review of EMR and patient or Child psychotherapist report     through collaboration with Medical illustrator, provider, and care team.   Interventions: Inter-disciplinary care team collaboration (see longitudinal plan of care) Evaluation of current treatment plan related to  self management and patient's adherence to plan as established by provider  Hypertension Interventions:  (Status:  New goal.) Long Term Goal Last practice recorded BP readings:  BP  Readings from Last 3 Encounters:  03/29/23 (!) 156/117  02/23/23 (!) 152/107  01/24/23 (!) 184/112   Most recent eGFR/CrCl:  Lab Results  Component Value Date   EGFR 82 10/15/2022    No components found for: "CRCL"  Evaluation of  current treatment plan related to hypertension self management and patient's adherence to plan as established by provider Provided education to patient re: stroke prevention, s/s of heart attack and stroke Counseled on the importance of exercise goals with target of 150 minutes per week Discussed plans with patient for ongoing care management follow up and provided patient with direct contact information for care management team Reviewed scheduled/upcoming provider appointments including:  Provided education on prescribed diet heart healthy Discussed complications of poorly controlled blood pressure such as heart disease, stroke, circulatory complications, vision complications, kidney impairment, sexual dysfunction Assessed social determinant of health barriers Discussed the importance of taking BP medication every day as instructed   Diabetes:  (Status: Goal on Track (progressing): YES.) Long Term Goal   Lab Results  Component Value Date   HGBA1C 13.8 (A) 01/24/2023   @ Assessed patient's understanding of A1c goal: <7% Reviewed medications with patient and discussed importance of medication adherence;        Reviewed prescribed diet with patient including a lean protein with each meal and snack; Counseled on importance of regular laboratory monitoring as prescribed;        Reviewed scheduled/upcoming provider appointments including: see list above;         Review of patient status, including review of consultants reports, relevant laboratory and other test results, and medications completed;       Assessed social determinant of health barriers;        Discussed omnipod and verified that patient knows who to contact with questions or concerns  Patient Goals/Self-Care Activities: Take medications as prescribed   Attend all scheduled provider appointments Call provider office for new concerns or questions  Work with the social worker to address care coordination needs and will continue to  work with the clinical team to address health care and disease management related needs schedule appointment with eye doctor check feet daily for cuts, sores or redness drink 6 to 8 glasses of water each day fill half of plate with vegetables

## 2023-04-11 NOTE — Patient Outreach (Signed)
Medicaid Managed Care   Nurse Care Manager Note  04/11/2023 Name:  Taio Buschman MRN:  [id number] DOB:  27-Jan-1961  Maximo Raineri is an 62 y.o. year old male who is a primary patient of [name], MD.  The [facility] Managed Care Coordination team was consulted for assistance with:    HTN DMI  Mr. Belshaw was given information about Medicaid Managed Care Coordination team services today. [name] Patient agreed to services and verbal consent obtained.  Engaged with patient by telephone for follow up visit in response to provider referral for case management and/or care coordination services.   Assessments/Interventions:  Review of past medical history, allergies, medications, health status, including review of consultants reports, laboratory and other test data, was performed as part of comprehensive evaluation and provision of chronic care management services.  SDOH (Social Determinants of Health) assessments and interventions performed: SDOH Interventions    Flowsheet Row Patient Outreach Telephone from 04/11/2023 in [place] POPULATION HEALTH DEPARTMENT Patient Outreach Telephone from 02/07/2023 in [place] POPULATION HEALTH DEPARTMENT Office Visit from 01/24/2023 in [facility] Patient Outreach Telephone from 01/04/2023 in [place] POPULATION HEALTH DEPARTMENT Patient Outreach Telephone from 12/02/2022 in [place] POPULATION HEALTH DEPARTMENT Patient Outreach Telephone from 09/29/2022 in [place] POPULATION HEALTH DEPARTMENT  SDOH Interventions        Food Insecurity Interventions -- -- Intervention Not Indicated -- -- Intervention Not Indicated  Housing Interventions -- Intervention Not Indicated  [Patient's daughter is helping with housing. Should be in housing by the end of July] Intervention Not Indicated -- -- --  Transportation Interventions Payor Benefit -- -- Payor Benefit  [Advised to use [facility] transportation and contact pharmacy for  prescription delivery] Intervention Not Indicated Intervention Not Indicated       Care Plan  Allergies  Allergen Reactions   Bee Venom Anaphylaxis   Shrimp [Shellfish Allergy] Anaphylaxis   Metformin And Related Other (See Comments)    Sharp kidney pains, body felt bad   Penicillins Other (See Comments)    Pt does not know what the reaction was    Medications Reviewed Today     Reviewed by [name], RN (Registered Nurse) on 04/11/23 at [phone]  Med List Status: <None>   Medication Order Taking? Sig Documenting Provider Last Dose Status Informant  amLODipine-olmesartan (AZOR) 10-40 MG tablet [id number] Yes Take 1 tablet by mouth daily. Nooruddin, Jason Fila, MD  Active   Blood Glucose Monitoring Suppl [facility] VERIO) w/Device KIT [id number]  Use to check blood sugar. Belva Agee, MD  Active   buPROPion (WELLBUTRIN XL) 150 MG 24 hr tablet [id number]  Take 1 tablet (150 mg total) by mouth daily.  Patient not taking: Reported on 02/07/2023   Belva Agee, MD  Active   cetaphil (CETAPHIL) lotion [id number]  Apply 1 Application topically as needed for dry skin.  Patient not taking: Reported on 01/04/2023   Belva Agee, MD  Active   Continuous Glucose Receiver (DEXCOM G6 RECEIVER) DEVI [id number] Yes Use to monitor your blood glucose continuously [name], DO  Active   Continuous Glucose Sensor (DEXCOM G6 SENSOR) MISC [id number] Yes Apply new sensor every 10 days, Use to monitor your blood glucose continuously [name], DO  Active   Continuous Glucose Sensor (DEXCOM G7 SENSOR) MISC [id number]  Change every 10 days Nooruddin, Saad, MD  Active   Continuous Glucose Transmitter (DEXCOM G6 TRANSMITTER) MISC [id number] Yes Use for 90 days with Dexcom G6 sensors, Use to monitor your  blood glucose continuously [name], DO  Active   DULoxetine (CYMBALTA) 30 MG capsule [id number]  Take 1 capsule (30 mg total) by mouth daily. Nooruddin, Jason Fila, MD   Active   insulin aspart (NOVOLOG FLEXPEN) 100 UNIT/ML FlexPen [id number] Yes Inject 12 Units into the skin 3 (three) times daily with meals. Belva Agee, MD  Active            Med Note (Joelie Schou A   Mon Feb 07, 2023  9:20 AM) Taking 15 units with meals  insulin aspart (NOVOLOG) 100 UNIT/ML injection [id number] Yes Use to fill V-GO insulin pump [name], DO  Active   Insulin Disposable Pump (OMNIPOD 5 G6 INTRO, GEN 5,) KIT [id number] Yes Apply and use to administer up to 70 units of insulin daily as instructed [name], DO  Active   Insulin Disposable Pump (OMNIPOD 5 G6 PODS, GEN 5,) MISC [id number] Yes Apply and use to administer up to 70 units of insulin daily as instructed [name], DO  Active   insulin glargine (LANTUS) 100 UNIT/ML Solostar Pen [id number]  Inject 30 Units into the skin at bedtime. 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) 03/29/2023   Pain due to onychomycosis of toenails of both feet 02/11/2023   Sesamoiditis 02/11/2023   Capsulitis 02/11/2023   Chest pressure 11/02/2022   Rash and nonspecific skin eruption 10/18/2022   Weight loss 10/15/2022   Fecal incontinence 10/15/2022   Polysubstance use disorder 09/16/2022   Housing instability, currently housed, at risk for homelessness 09/16/2022   Tobacco use disorder 03/30/2021   Healthcare maintenance 03/30/2021   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 Tun is now using the Omnipod for insulin administration. He reports improved BS readings with an average today being 120. Recent elevated BP in the office, patient reports not taking medication prior to his appointment.  RNCM Clinical Goal(s):  Patient will verbalize understanding of plan for management of DM as evidenced by patient reports take all medications exactly as prescribed and will call provider for medication related questions as evidenced by patient reports and EMR documentation    attend all scheduled medical appointments:  04/21/23 Cardiology and 04/28/23 with PCP as evidenced by provider documentation        work with Child psychotherapist to address needing vision and dental providers related to the management of DM as evidenced by review of EMR and patient or Child psychotherapist report     through collaboration with Medical illustrator, provider, and care  team.   Interventions: Inter-disciplinary care team collaboration (see longitudinal plan of care) Evaluation of current treatment plan related to  self management and patient's adherence to plan as established by provider  Hypertension Interventions:  (Status:  New goal.) Long Term Goal Last practice recorded BP readings:  BP Readings from Last 3 Encounters:  03/29/23 (!) 156/117  02/23/23 (!) 152/107  01/24/23 (!) 184/112   Most recent eGFR/CrCl:  Lab Results  Component Value Date   EGFR 82 10/15/2022    No components found for: "CRCL"  Evaluation of current treatment plan related to hypertension self management and patient's adherence to plan as established by provider Provided education to patient re: stroke prevention, s/s of  heart attack and stroke Counseled on the importance of exercise goals with target of 150 minutes per week Discussed plans with patient for ongoing care management follow up and provided patient with direct contact information for care management team Reviewed scheduled/upcoming provider appointments including:  Provided education on prescribed diet heart healthy Discussed complications of poorly controlled blood pressure such as heart disease, stroke, circulatory complications, vision complications, kidney impairment, sexual dysfunction Assessed social determinant of health barriers Discussed the importance of taking BP medication every day as instructed   Diabetes:  (Status: Goal on Track (progressing): YES.) Long Term Goal   Lab Results  Component Value Date   HGBA1C 13.8 (A) 01/24/2023   @ Assessed patient's understanding of A1c goal: <7% Reviewed medications with patient and discussed importance of medication adherence;        Reviewed prescribed diet with patient including a lean protein with each meal and snack; Counseled on importance of regular laboratory monitoring as prescribed;        Reviewed scheduled/upcoming provider appointments including: see list above;         Review of patient status, including review of consultants reports, relevant laboratory and other test results, and medications completed;       Assessed social determinant of health barriers;        Discussed omnipod and verified that patient knows who to contact with questions or concerns  Patient Goals/Self-Care Activities: Take medications as prescribed   Attend all scheduled provider appointments Call provider office for new concerns or questions  Work with the social worker to address care coordination needs and will continue to work with the clinical team to address health care and disease management related needs schedule appointment with eye doctor check feet daily for cuts, sores or redness drink 6 to  8 glasses of water each day fill half of plate with vegetables       Follow Up:  Patient agrees to Care Plan and Follow-up.  Plan: The Managed Medicaid care management team will reach out to the patient again over the next 60 days.  Date/time of next scheduled RN care management/care coordination outreach:  06/13/23 at 9am  [name] RN, BSN [place]  Value-Based Care [facility] RN Care Coordinator [phone]

## 2023-04-14 ENCOUNTER — Telehealth: Payer: Self-pay | Admitting: Dietician

## 2023-04-14 NOTE — Telephone Encounter (Signed)
Ricky Boyle states that his Dexcom sensor stopped working after reading 42 blood sugar, he panicked and treated with 8 oz apple juice and sensor still read low, so he ate chocolate candy as well.  He then got a sensor issue alarm to change his sensor and used his meter 3 hours later. He says it read 768. He states that he doubts his blood sugar was really 42. The sensor canula was not in his body when he removed it to put a new one on.  He treated the high blood sugar with insulin and got it back down to normal. Advised him to report this to Dexcom. Upgrade to G7 as soon as possible. Reviewed what and how much to treat low blood sugar with and commended him on excellent self management of his diabetes.

## 2023-04-19 ENCOUNTER — Other Ambulatory Visit: Payer: Self-pay | Admitting: Student

## 2023-04-19 DIAGNOSIS — E1169 Type 2 diabetes mellitus with other specified complication: Secondary | ICD-10-CM

## 2023-04-20 ENCOUNTER — Other Ambulatory Visit (HOSPITAL_COMMUNITY): Payer: Self-pay

## 2023-04-20 ENCOUNTER — Encounter: Payer: Self-pay | Admitting: Pharmacist

## 2023-04-20 MED ORDER — DEXCOM G6 RECEIVER DEVI
0 refills | Status: AC
Start: 2023-04-20 — End: ?
  Filled 2023-04-20: qty 1, fill #0
  Filled 2023-05-26 – 2023-07-07 (×2): qty 1, 30d supply, fill #0
  Filled 2023-07-29: qty 1, 90d supply, fill #0
  Filled 2023-08-04: qty 1, 30d supply, fill #0
  Filled 2023-09-22: qty 1, 1d supply, fill #0
  Filled 2023-10-03 – 2023-10-23 (×2): qty 1, 90d supply, fill #0
  Filled 2023-11-23: qty 1, 30d supply, fill #0

## 2023-04-21 ENCOUNTER — Ambulatory Visit (HOSPITAL_BASED_OUTPATIENT_CLINIC_OR_DEPARTMENT_OTHER): Payer: Medicaid Other | Admitting: Cardiology

## 2023-04-25 ENCOUNTER — Other Ambulatory Visit: Payer: Self-pay

## 2023-04-25 ENCOUNTER — Other Ambulatory Visit (HOSPITAL_COMMUNITY): Payer: Self-pay

## 2023-04-26 ENCOUNTER — Other Ambulatory Visit (HOSPITAL_COMMUNITY): Payer: Self-pay

## 2023-04-28 ENCOUNTER — Ambulatory Visit: Payer: Medicaid Other | Admitting: Student

## 2023-04-28 ENCOUNTER — Other Ambulatory Visit (HOSPITAL_COMMUNITY): Payer: Self-pay

## 2023-04-28 ENCOUNTER — Encounter: Payer: Self-pay | Admitting: Student

## 2023-04-28 ENCOUNTER — Other Ambulatory Visit: Payer: Self-pay

## 2023-04-28 ENCOUNTER — Ambulatory Visit: Payer: Medicaid Other | Admitting: Dietician

## 2023-04-28 DIAGNOSIS — E0842 Diabetes mellitus due to underlying condition with diabetic polyneuropathy: Secondary | ICD-10-CM | POA: Diagnosis not present

## 2023-04-28 DIAGNOSIS — J069 Acute upper respiratory infection, unspecified: Secondary | ICD-10-CM | POA: Diagnosis not present

## 2023-04-28 DIAGNOSIS — E1169 Type 2 diabetes mellitus with other specified complication: Secondary | ICD-10-CM | POA: Diagnosis not present

## 2023-04-28 DIAGNOSIS — K869 Disease of pancreas, unspecified: Secondary | ICD-10-CM | POA: Diagnosis not present

## 2023-04-28 MED ORDER — PEN NEEDLES 32G X 4 MM MISC
1.0000 | 11 refills | Status: AC | PRN
Start: 2023-04-28 — End: ?
  Filled 2023-04-28: qty 100, 90d supply, fill #0
  Filled 2023-04-28 (×2): qty 100, 25d supply, fill #0
  Filled 2023-06-02: qty 100, fill #0
  Filled 2023-06-07: qty 100, 30d supply, fill #0

## 2023-04-28 NOTE — Progress Notes (Signed)
   CC: URI symptoms, and Diabetic neuropathy  This is a telephone encounter between Ricky Boyle and Ricky Boyle on 04/28/2023 for URI sxs and diabetic neuropathy. The visit was conducted with the patient located at home and Ricky Boyle at [facility]. The patient's identity was confirmed using their DOB and current address. The patient has consented to being evaluated through a telephone encounter and understands the associated risks (an examination cannot be done and the patient may need to come in for an appointment) / benefits (allows the patient to remain at home, decreasing exposure to coronavirus). I personally spent 20 minutes on medical discussion.   HPI:  Ricky Boyle is a 62 y.o. with PMH as below.   Please see A&P for assessment of the patient's acute and chronic medical conditions.   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  Patient has tried 3 different medicine including Lyrica, duloxetine, Cymbalta, and was unable to tolerate, due to "fluttering sensation" in his chest.    Duloxetine, has more favorable profile, with the least side effects for patients.  I discussed with Mr. Sadek to retry duloxetine one more time, as he is running out of options for diabetic neuropathy. TCA is an option, however has more side effects and I would not  recommend for this patient.  -Retry duloxetine 30 mg.  Viral upper respiratory tract infection Patient reports 3 days of nasal congestion, body aches, general fatigue and dry cough, with 1 episode of blood-tinged cough.  He denies nasal drainage or itchy watery eyes.  Also denies nausea, vomiting or diarrhea.  He denies any sick contacts.  Possible differential includes Flu vs COVID vs other respiratory viruses.  I have asked the patient to get COVID testing at home, and if positive call as as he may be eligible for Paxlovid.  -Symptomatic treatment with Tylenol as needed -Home COVID testing.    Patient seen with Dr. Delena Bali, MD  Internal Medicine Resident

## 2023-04-28 NOTE — Assessment & Plan Note (Addendum)
He has pain in bilateral feet, no numbness or tingling sensation.  Patient has tried 3 different medicine including Lyrica, duloxetine, Cymbalta, and was unable to tolerate, due to "fluttering sensation" in his chest.    Duloxetine, has more favorable profile, with the least side effects for patients.  I discussed with Ricky Boyle to retry duloxetine one more time, as he is running out of options for diabetic neuropathy. TCA is an option, however has more side effects and I would not recommend for this patient.  -Retry duloxetine 30 mg.

## 2023-04-28 NOTE — Progress Notes (Signed)
Diabetes Self-Management Education  Visit Type: Follow-up  Appt. Start Time: 915 Appt. End Time: 945  04/28/2023  Ricky Boyle, identified by name and date of birth, is a 62 y.o. male with a diagnosis of Diabetes:  .   ASSESSMENT  This is a telephone encounter between Ricky Boyle  and Ricky Boyle Ricky Boyle  on 04/28/2023 for Diabetes Self Management Education & Support . The visit was conducted with the patient located at home and Norm Parcel  at [facility]. The patient's identity was confirmed using their DOB and current address. The patient has consented to being evaluated through a telephone encounter and understands the associated risks / benefits (allows the patient to remain at home, decreasing exposure to coronavirus). I personally spent 28 minutes on medical nutrition therapy discussion.   The following statements were read to the patient and/or legal guardian that are established with the Nutritional Health Provider.   "The purpose of this phone visit is to provide behavioral health care while limiting exposure to the coronavirus (COVID19).  There is a possibility of technology failure and discussed alternative modes of communication if that failure occurs."   "By engaging in this telephone visit, you consent to the provision of healthcare.  Additionally, you authorize for your insurance to be billed for the services provided during this telephone visit."    Patient and/or legal guardian consented to telephone visit: yes  Patient has not concerns today. He feels the insulin pump therapy is going well. His legs do not hurt anymore, he has more energy. He is still using the Dexcom G6 and G6 pods. He has G7 sensors to start usnig when his pods are compatible. He was encouraged to take a correction insulin dose when blood sugar is above 250 mg/dl. He verbalized understanding to sikc day, bad pump site management insulin on board. He needs pen needles, other supplied can be put on  hold  Insulin pump follow up visit  Review if he is bolusing before meal- yes  putting in his blood sugar readings for every meal- yes  doing a correction dose whenever his blood sugar is over 250- no  review high blood sugar protocols and sick day management- done  if any problems with pod changes or pods sticking, alerts/alarms- no Knows what insulin on board means- discussed Is aware of Bad pump site management- discussed Has long acting/rapid acting and supplies(meter,strips and lancets) readily available- yes except needs pen needles    Diabetes Self-Management Education - 04/28/23 0900       Visit Information   Visit Type Follow-up      Health Coping   How would you rate your overall health? Good      Patient Education   Previous Diabetes Education Yes (please comment)   pumjp training here and by [facility]   Medications Taught/reviewed insulin/injectables, injection, site rotation, insulin/injectables storage and needle disposal.;Reviewed patients medication for diabetes, action, purpose, timing of dose and side effects.;Reviewed medication adjustment guidelines for hyperglycemia and sick days.    Monitoring Taught/evaluated CGM (comment)    Acute complications Taught prevention, symptoms, and  treatment of hypoglycemia - the 15 rule.;Discussed and identified patients' prevention, symptoms, and treatment of hyperglycemia.;Educated on sick day management    Chronic complications Applicable immunizations      Individualized Goals (developed by patient)   Medications take my medication as prescribed   take correction when blood sugar >250 mg/dL     Outcomes   Expected Outcomes Demonstrated interest in learning.  Expect positive outcomes    Future DMSE 4-6 wks    Program Status Not Completed      Subsequent Visit   Since your last visit have you continued or begun to take your medications as prescribed? Yes    Since your last visit have you had your blood pressure checked? No     Since your last visit have you experienced any weight changes? No change    Since your last visit, are you checking your blood glucose at least once a day? Yes             Individualized Plan for Diabetes Self-Management Training:   Learning Objective:  Patient will have a greater understanding of diabetes self-management. Patient education plan is to attend individual and/or group sessions per assessed needs and concerns.   Plan:   Patient Instructions  Ricky Boyle,   Thank you for your visit today!  You are doing a great job with insulin pump management!  Today we discussed:   Insulin on board  Taking a correction when blood sugar is more than 250 mg/dL  Bad pod site management- if it is not lowering your blood sugar- use your Novolog pen and change it out!  Sick day management- take correction bolus as needed  A prescription for insulin pen needles was requested to be sent to your pharmacy.  Le's follow up in 4-6 weeks. ( I made this appointment- if it doe snot work, please reschedule)  Call anytime! Ricky Boyle [phone]     Expected Outcomes:  Demonstrated interest in learning. Expect positive outcomes  Education material provided: Diabetes Resources  If problems or questions, patient to contact team via:  Phone and Email  Future DSME appointment: 4-6 wks

## 2023-04-28 NOTE — Addendum Note (Signed)
Addended by: Erlinda Hong T on: 04/28/2023 11:35 AM   Modules accepted: Orders

## 2023-04-28 NOTE — Patient Instructions (Addendum)
Mr. Schielke,   Thank you for your visit today!  You are doing a great job with insulin pump management!  Today we discussed:   Insulin on board  Taking a correction when blood sugar is more than 250 mg/dL  Bad pod site management- if it is not lowering your blood sugar- use your Novolog pen and change it out!  Sick day management- take correction bolus as needed  A prescription for insulin pen needles was requested to be sent to your pharmacy.  Let's follow up in 4-6 weeks. (I made this appointment- if it does not work, please reschedule)  Call anytime! Lupita Leash [phone]

## 2023-04-28 NOTE — Addendum Note (Signed)
Addended by: [name] on: 04/28/2023 11:58 AM   Modules accepted: Orders

## 2023-04-28 NOTE — Assessment & Plan Note (Signed)
Patient reports 3 days of nasal congestion, body aches, general fatigue and dry cough, with 1 episode of blood-tinged cough.  He denies nasal drainage or itchy watery eyes.  Also denies nausea, vomiting or diarrhea.  He denies any sick contacts.  Possible differential includes Flu vs COVID vs other respiratory viruses.  I have asked the patient to get COVID testing at home, and if positive call as as he may be eligible for Paxlovid.  -Symptomatic treatment with Tylenol as needed -Home COVID testing.

## 2023-04-29 ENCOUNTER — Other Ambulatory Visit (HOSPITAL_COMMUNITY): Payer: Self-pay

## 2023-04-29 ENCOUNTER — Encounter: Payer: Medicaid Other | Admitting: Student

## 2023-05-03 NOTE — Progress Notes (Signed)
Internal Medicine Attending:  I spoke with the patient by phone and confirmed the history and medical decision making personally. I discussed the case with the resident and agree with the plan of care as documented in the resident's note.

## 2023-05-11 ENCOUNTER — Other Ambulatory Visit (HOSPITAL_COMMUNITY): Payer: Self-pay

## 2023-05-24 ENCOUNTER — Telehealth: Payer: Self-pay | Admitting: Dietician

## 2023-05-24 NOTE — Telephone Encounter (Signed)
He left all of his insulin in [place], better now. Took Novolog and did not realize it was the same as aspart. We discussed traveling with diabetes and taking extra insulin and supplies with him at al times.

## 2023-05-26 ENCOUNTER — Other Ambulatory Visit (HOSPITAL_COMMUNITY): Payer: Self-pay

## 2023-05-30 ENCOUNTER — Other Ambulatory Visit (HOSPITAL_COMMUNITY): Payer: Self-pay

## 2023-05-30 ENCOUNTER — Other Ambulatory Visit: Payer: Self-pay

## 2023-06-02 ENCOUNTER — Other Ambulatory Visit: Payer: Self-pay

## 2023-06-03 DIAGNOSIS — Z419 Encounter for procedure for purposes other than remedying health state, unspecified: Secondary | ICD-10-CM | POA: Diagnosis not present

## 2023-06-04 ENCOUNTER — Other Ambulatory Visit (HOSPITAL_COMMUNITY): Payer: Self-pay

## 2023-06-06 ENCOUNTER — Other Ambulatory Visit: Payer: Self-pay

## 2023-06-07 ENCOUNTER — Other Ambulatory Visit (HOSPITAL_COMMUNITY): Payer: Self-pay

## 2023-06-07 ENCOUNTER — Encounter: Payer: Medicaid Other | Admitting: Student

## 2023-06-07 ENCOUNTER — Other Ambulatory Visit: Payer: Self-pay

## 2023-06-07 DIAGNOSIS — E1169 Type 2 diabetes mellitus with other specified complication: Secondary | ICD-10-CM

## 2023-06-07 DIAGNOSIS — F32A Depression, unspecified: Secondary | ICD-10-CM

## 2023-06-07 MED ORDER — BUPROPION HCL ER (XL) 150 MG PO TB24
150.0000 mg | ORAL_TABLET | Freq: Every day | ORAL | 1 refills | Status: DC
Start: 2023-06-07 — End: 2024-01-23
  Filled 2023-06-07 – 2023-07-07 (×2): qty 90, 90d supply, fill #0
  Filled 2023-10-03: qty 90, 90d supply, fill #1

## 2023-06-07 MED ORDER — NOVOLOG FLEXPEN 100 UNIT/ML ~~LOC~~ SOPN
12.0000 [IU] | PEN_INJECTOR | Freq: Three times a day (TID) | SUBCUTANEOUS | 11 refills | Status: AC
Start: 2023-06-07 — End: ?
  Filled 2023-06-07 – 2023-06-09 (×2): qty 15, 42d supply, fill #0
  Filled 2023-07-29: qty 15, 42d supply, fill #1
  Filled 2023-09-22: qty 15, 42d supply, fill #2
  Filled 2023-11-23: qty 15, 42d supply, fill #3
  Filled 2024-01-23: qty 15, 42d supply, fill #4
  Filled 2024-04-10: qty 15, 42d supply, fill #5
  Filled 2024-05-31: qty 15, 42d supply, fill #6

## 2023-06-07 MED ORDER — OMNIPOD 5 DEXG7G6 PODS GEN 5 MISC
1.0000 | Freq: Every day | 3 refills | Status: DC
Start: 2023-06-07 — End: 2023-08-04
  Filled 2023-06-07 – 2023-06-09 (×5): qty 10, 30d supply, fill #0
  Filled 2023-07-07: qty 10, 30d supply, fill #1
  Filled 2023-08-04: qty 10, 30d supply, fill #2

## 2023-06-07 NOTE — Telephone Encounter (Signed)
Tricia from [facility] called she is requesting rx refills for the patient.

## 2023-06-08 ENCOUNTER — Telehealth: Payer: Self-pay | Admitting: Dietician

## 2023-06-08 ENCOUNTER — Telehealth: Payer: Self-pay

## 2023-06-08 ENCOUNTER — Other Ambulatory Visit: Payer: Self-pay

## 2023-06-08 ENCOUNTER — Other Ambulatory Visit (HOSPITAL_COMMUNITY): Payer: Self-pay

## 2023-06-08 NOTE — Telephone Encounter (Signed)
Patient asked to change tomorrow's appointment to phone due to lack of transportation and he asked what he needs to do to change over to using the Dexcom G7 CGM as pharmacy contacted him that the pods are not G7 pods.

## 2023-06-08 NOTE — Telephone Encounter (Signed)
Prior Authorization for patient (Omnipod 5 DexG7G6 Pods Gen 5) came through on cover my meds was submitted with last office notes and labs awaiting approval or denial.  ZOX:[id number]

## 2023-06-08 NOTE — Telephone Encounter (Signed)
Decision:Approved [name] (Key: [facility]) PA Case ID #: [id number] Rx #: [id number] Need Help? Call us at [phone] Outcome Approved today by [facility] Medicaid 2017 Approved. This drug has been approved. Approved quantity: 10 pods per 30 day(s). You may fill up to a 34 day supply at a retail pharmacy. You may fill up to a 90 day supply for maintenance drugs, please refer to the formulary for details. Please call the pharmacy to process your prescription claim. Authorization Expiration Date: 06/07/2024 Drug Omnipod 5 DexG7G6 Pods Gen 5 ePA cloud logo Form [facility] Medicaid of [employer] Electronic Prior Authorization Request Form [phone] [place])

## 2023-06-09 ENCOUNTER — Other Ambulatory Visit (HOSPITAL_COMMUNITY): Payer: Self-pay

## 2023-06-09 ENCOUNTER — Ambulatory Visit (INDEPENDENT_AMBULATORY_CARE_PROVIDER_SITE_OTHER): Payer: Medicaid Other | Admitting: Dietician

## 2023-06-09 ENCOUNTER — Other Ambulatory Visit: Payer: Self-pay

## 2023-06-09 DIAGNOSIS — K869 Disease of pancreas, unspecified: Secondary | ICD-10-CM

## 2023-06-09 DIAGNOSIS — E1169 Type 2 diabetes mellitus with other specified complication: Secondary | ICD-10-CM

## 2023-06-09 NOTE — Patient Instructions (Addendum)
Hi Mr. Tay, [place] are doing great with your Omnipod Insulin pump!   Here is how to upgrade from Dexcom G6 to Dexcom G7 CGM. If you choose to add the sensor later, you can do so by going to the Home screen, tapping the Menu button, and then tapping Manage Sensor.  Select ADD NEW to add the Dexcom G7 sensor.  Connect the Dexcom G7 sensor to both the Omnipod 5 app and the Dexcom G7 app.  Scan the Dexcom G7 QR code on the applicator using the Controller or a compatible Android smartphone's camera.  Enter the [employer] and 12-digit Serial Number.   Try using the Activity mode on your Omnipod .  Here is the Extended meal bolus you could consider using on Thanksgiving:   The Omnipod 5 also offers an Extended Bolus feature, which is available in Manual Mode. This feature delivers insulin over a longer period of time, and is useful for meals that are high in fat and protein, or that take a long time to eat. However, the Extended Bolus feature only extends the meal portion of the bolus, while the correction portion is always delivered immediately.   A walk or bike ride every day for 30 minutes will help your pain in your feet and legs.   A 3 month follow up for your diabetes self management  has been scheduled. for September 08, 2023 at 9:15 AM.   I will ask for referral for podiatry (foot doctor) and quitting smoking. Until you quit smoking, you should see a podiatrist (foot doctor) every 3 months.  I can also ask about a BP monitor, but please ask when you come top the office on 06/27/23.   Lupita Leash [phone]

## 2023-06-09 NOTE — Progress Notes (Signed)
Estimated body mass index is 29.83 kg/m as calculated from the following:   Height as of 03/29/23: 5\' 6"  (1.676 m).   Weight as of 03/29/23: 184 lb 12.8 oz (83.8 kg). Wt Readings from Last 10 Encounters:  03/29/23 184 lb 12.8 oz (83.8 kg)  02/23/23 188 lb 14.4 oz (85.7 kg)  01/24/23 182 lb 8 oz (82.8 kg)  11/02/22 175 lb 8 oz (79.6 kg)  10/15/22 170 lb 8 oz (77.3 kg)  09/15/22 169 lb 12.8 oz (77 kg)  09/13/22 177 lb 14.6 oz (80.7 kg)  07/22/22 178 lb (80.7 kg)  08/27/21 208 lb (94.3 kg)  06/24/21 205 lb 8 oz (93.2 kg)   BP Readings from Last 3 Encounters:  03/29/23 (!) 156/117  02/23/23 (!) 152/107  01/24/23 (!) 184/112   Lab Results  Component Value Date   HGBA1C 13.8 (A) 01/24/2023   HGBA1C >14.0 (A) 09/15/2022   HGBA1C 11.8 (H) 06/21/2021   HGBA1C 5.5 03/14/2021   HGBA1C 5.2 04/17/2020    Diabetes Self-Management Education  Visit Type: Follow-up  Appt. Start Time: 915 Appt. End Time: 952  06/09/2023  [name], identified by name and date of birth, is a 62 y.o. male with a diagnosis of Diabetes:  .   ASSESSMENT  This was a telephone visit. I connected with  Ricky Boyle on 06/09/23 by a video enabled telemedicine application and verified that I am speaking with the correct person using two identifiers.   I discussed the limitations of evaluation and management by telemedicine. The patient expressed understanding and agreed to proceed  Problems with feet. Knots under feet hurt, legs hurt less. Podiatry- ask for a referral knots under feet- would diabetic shoes help?  Smoker- he is trying to quit, yes wants help counseling patch didn't work   Insulin pump follow up #2 visit: pump start date:03/29/23 (~9-10 WEEKS)    He is bolusing before meals, his Dexcom is paired with his Omnipod so he does not have to put in his blood sugar readings for every meal. He does a correction dose whenever his blood sugar is over 250. We reviewed how auto mode works when he  asked why his pump seems to give more insulin unexpectedly. No lows reported, he states he panics when he sees blood glucsoe in the 80 with trend arrow down and eats candy.    Review of CGM data  CGM Results from download:   % Time CGM active:   57 %   (Goal >70%)  Average glucose:   130 mg/dL for 4/09 days  Glucose management indicator:   6.4 %  Time in range (70-180 mg/dL):   93 %   (Goal >81%)  Time High (181-250 mg/dL):   7 %   (Goal < 19%)  Time Very High (>250 mg/dL):    0 %   (Goal < 5%)  Time Low (54-69 mg/dL):   0 %   (Goal <1%)  Time Very Low (<54 mg/dL):   0 %   (Goal <4%)  Coefficient of variation:   21.7 %   (Goal <36%)    A1c and flu vax on 06/27/23   Diabetes Self-Management Education - 06/09/23 1000       Complications   Last HgB A1C per patient/outside source 13.8 %    How often do you check your blood sugar? > 4 times/day             Individualized Plan for Diabetes Self-Management Training:  Learning Objective:  Patient will have a greater understanding of diabetes self-management. Patient education plan is to attend individual and/or group sessions per assessed needs and concerns.   Plan:   Patient Instructions  [name], Boyle are doing great with your Omnipod Insulin pump!   Here is how to upgrade from Dexcom G6 to Dexcom G7 CGM. If you choose to add the sensor later, you can do so by going to the Home screen, tapping the Menu button, and then tapping Manage Sensor.  Select ADD NEW to add the Dexcom G7 sensor.  Connect the Dexcom G7 sensor to both the Omnipod 5 app and the Dexcom G7 app.  Scan the Dexcom G7 QR code on the applicator using the Controller or a compatible Android smartphone's camera.  Enter the [employer] and 12-digit Serial Number.   Try using the Activity mode on your Omnipod .  Here is the Extended meal bolus you could consider using on Thanksgiving:   The Omnipod 5 also offers an Extended Bolus feature, which  is available in Manual Mode. This feature delivers insulin over a longer period of time, and is useful for meals that are high in fat and protein, or that take a long time to eat. However, the Extended Bolus feature only extends the meal portion of the bolus, while the correction portion is always delivered immediately.   A walk or bike ride every day for 30 minutes will help your pain in your feet and legs.   A 3 month follow up for your diabetes self management  has been scheduled. for September 08, 2023 at 9:15 AM.   I will ask for referral for podiatry (foot doctor) and quitting smoking. Until you quit smoking, you should see a podiatrist (foot doctor) every 3 months.  I can also ask about a BP monitor, but please ask when you come top the office on 06/27/23.   Ricky Boyle [phone]   Expected Outcomes:  Demonstrated interest in learning. Expect positive outcomes  Education material provided: Diabetes Resources  If problems or questions, patient to contact team via:  Phone and Email  Future DSME appointment: 3-4 months Ricky Boyle, RD 06/09/2023 10:17 AM.

## 2023-06-10 ENCOUNTER — Other Ambulatory Visit (HOSPITAL_COMMUNITY): Payer: Self-pay

## 2023-06-10 ENCOUNTER — Other Ambulatory Visit: Payer: Self-pay

## 2023-06-13 ENCOUNTER — Other Ambulatory Visit: Payer: Self-pay | Admitting: *Deleted

## 2023-06-13 NOTE — Patient Instructions (Signed)
Visit Information  Ricky Boyle was given information about Medicaid Managed Care team care coordination services as a part of their [facility] Medicaid benefit. Ricky Boyle verbally consented to engagement with the [facility] Managed Care team.   If you are experiencing a medical emergency, please call 911 or report to your local emergency department or urgent care.   If you have a non-emergency medical problem during routine business hours, please contact your provider's office and ask to speak with a nurse.   For questions related to your [facility] health plan, please call: [phone] or go here:[email or web address]  If you would like to schedule transportation through your [facility] plan, please call the following number at least 2 days in advance of your appointment: [phone].   You can also use the MTM portal or MTM mobile app to manage your rides. Reimbursement for transportation is available through [facility]! For the portal, please go to [email or web address]  Call the [facility] Crisis Line at [phone], at any time, 24 hours a day, 7 days a week. If you are in danger or need immediate medical attention call 911.  If you would like help to quit smoking, call 1-800-QUIT-NOW (9377508431) OR Espaol: 1-855-Djelo-Ya (9-323-557-3220) o para ms informacin haga clic aqu or Text READY to 254-270 to register via text  [name],   Please see education materials related to HTN provided by MyChart link.  Patient verbalizes understanding of instructions and care plan provided today and agrees to view in MyChart. Active MyChart status and patient understanding of how to access instructions and care plan via MyChart confirmed with patient.     Telephone follow up appointment with Managed Medicaid care management team member scheduled for:07/14/23 at 10:30am  Ricky Emms RN, BSN [place]  [facility] RN Care  Coordinator [phone]   Following is a copy of your plan of care:  Care Plan : RN Care Manager Plan of Care  Updates made by Ricky Dach, RN since 06/13/2023 12:00 AM     Problem: Health Management needs related to DM      Long-Range Goal: Development of Plan of Care to address Health Management needs related to DM   Start Date: 09/29/2022  Expected End Date: 06/13/2023  Note:   Current Barriers:  Chronic Disease Management support and education needs related to DM Ricky Boyle is utilizing Omnipod and Dexcom to manage his BS. BS readings have improved. Scheduled with PCP on 06/27/23 for follow up. He has not checked BP since August. Taking more BP medication than prescribed.  RNCM Clinical Goal(s):  Patient will verbalize understanding of plan for management of DM as evidenced by patient reports take all medications exactly as prescribed and will call provider for medication related questions as evidenced by patient reports and EMR documentation    attend all scheduled medical appointments:  06/27/23 with PCP as evidenced by provider documentation        work with Child psychotherapist to address needing vision and dental providers related to the management of DM as evidenced by review of EMR and patient or Child psychotherapist report     through collaboration with Medical illustrator, provider, and care team.   Interventions: Inter-disciplinary care team collaboration (see longitudinal plan of care) Evaluation of current treatment plan related to  self management and patient's adherence to plan as established by provider  Hypertension Interventions:  (Status:  Goal on track:  Yes.) Long Term Goal-Has not checked BP, no recent readings Last  practice recorded BP readings:  BP Readings from Last 3 Encounters:  03/29/23 (!) 156/117  02/23/23 (!) 152/107  01/24/23 (!) 184/112   Most recent eGFR/CrCl:  Lab Results  Component Value Date   EGFR 82 10/15/2022    No components found for:  "CRCL"  Evaluation of current treatment plan related to hypertension self management and patient's adherence to plan as established by provider Provided education to patient re: stroke prevention, s/s of heart attack and stroke Counseled on the importance of exercise goals with target of 150 minutes per week Discussed plans with patient for ongoing care management follow up and provided patient with direct contact information for care management team Reviewed scheduled/upcoming provider appointments including:  Provided education on prescribed diet heart healthy Discussed complications of poorly controlled blood pressure such as heart disease, stroke, circulatory complications, vision complications, kidney impairment, sexual dysfunction Assessed social determinant of health barriers Discussed the importance of taking BP medication every day as instructed Collaborated with PCP requesting order for BP monitor-send to [facility] Advised patient to obtain BP monitor, check BP and record-take to upcoming PCP visit Advised patient to take all medication to PCP appointment Provided education on monitoring BP   Diabetes:  (Status: Goal on Track (progressing): YES.) Long Term Goal   Lab Results  Component Value Date   HGBA1C 13.8 (A) 01/24/2023   @ Assessed patient's understanding of A1c goal: <7% Reviewed medications with patient and discussed importance of medication adherence;        Reviewed prescribed diet with patient including a lean protein with each meal and snack; Counseled on importance of regular laboratory monitoring as prescribed;        Reviewed scheduled/upcoming provider appointments including: see list above;         Review of patient status, including review of consultants reports, relevant laboratory and other test results, and medications completed;       Assessed social determinant of health barriers;        Discussed omnipod and verified that patient knows who to  contact with questions or concerns Reviewed recent provider notes and discussed improvement with BS readings Provided encouragement   Patient Goals/Self-Care Activities: Take medications as prescribed   Attend all scheduled provider appointments Call provider office for new concerns or questions  Work with the social worker to address care coordination needs and will continue to work with the clinical team to address health care and disease management related needs schedule appointment with eye doctor check feet daily for cuts, sores or redness drink 6 to 8 glasses of water each day fill half of plate with vegetables

## 2023-06-13 NOTE — Patient Outreach (Signed)
Medicaid Managed Care   Nurse Care Manager Note  06/13/2023 Name:  Ricky Boyle MRN:  [id number] DOB:  Jun 13, 1961  Ricky Boyle is an 62 y.o. year old male who is a primary patient of [name], MD.  The [facility] Managed Care Coordination team was consulted for assistance with:    HTN DMII  Ricky Boyle was given information about Medicaid Managed Care Coordination team services today. Ricky Boyle Patient agreed to services and verbal consent obtained.  Engaged with patient by telephone for follow up visit in response to provider referral for case management and/or care coordination services.   Assessments/Interventions:  Review of past medical history, allergies, medications, health status, including review of consultants reports, laboratory and other test data, was performed as part of comprehensive evaluation and provision of chronic care management services.  SDOH (Social Determinants of Health) assessments and interventions performed: SDOH Interventions    Flowsheet Row Patient Outreach Telephone from 04/11/2023 in [place] POPULATION HEALTH DEPARTMENT Patient Outreach Telephone from 02/07/2023 in [place] POPULATION HEALTH DEPARTMENT Office Visit from 01/24/2023 in [facility] Patient Outreach Telephone from 01/04/2023 in [place] POPULATION HEALTH DEPARTMENT Patient Outreach Telephone from 12/02/2022 in [place] POPULATION HEALTH DEPARTMENT Patient Outreach Telephone from 09/29/2022 in [place] POPULATION HEALTH DEPARTMENT  SDOH Interventions        Food Insecurity Interventions -- -- Intervention Not Indicated -- -- Intervention Not Indicated  Housing Interventions -- Intervention Not Indicated  [Patient's daughter is helping with housing. Should be in housing by the end of July] Intervention Not Indicated -- -- --  Transportation Interventions Payor Benefit -- -- Payor Benefit  [Advised to use [facility]  transportation and contact pharmacy for prescription delivery] Intervention Not Indicated Intervention Not Indicated       Care Plan  Allergies  Allergen Reactions   Bee Venom Anaphylaxis   Shrimp [Shellfish Allergy] Anaphylaxis   Metformin And Related Other (See Comments)    Sharp kidney pains, body felt bad   Penicillins Other (See Comments)    Pt does not know what the reaction was    Medications Reviewed Today     Reviewed by [name], RN (Registered Nurse) on 06/13/23 at [phone]  Med List Status: <None>   Medication Order Taking? Sig Documenting Provider Last Dose Status Informant  amLODipine-olmesartan (AZOR) 10-40 MG tablet [id number] Yes Take 1 tablet by mouth daily. NooruddinJason Fila, MD Taking Active            Med Note Ardelia Mems, Mechel Schutter A   Mon Jun 13, 2023  9:15 AM) Patient taking 1 tablet twice daily  Blood Glucose Monitoring [facility] VERIO) w/Device KIT [id number] Yes Use to check blood sugar. Belva Agee, MD Taking Active   buPROPion (WELLBUTRIN XL) 150 MG 24 hr tablet [id number] No Take 1 tablet (150 mg total) by mouth daily.  Patient not taking: Reported on 06/13/2023   Rana Snare, DO Not Taking Active   cetaphil (CETAPHIL) lotion [id number] No Apply 1 Application topically as needed for dry skin.  Patient not taking: Reported on 01/04/2023   Belva Agee, MD Not Taking Active   Continuous Glucose Receiver [facility] G6 RECEIVER) DEVI [id number] No Use to monitor your blood glucose continuously  Patient not taking: Reported on 06/13/2023   Earl Lagos, MD Not Taking Active   Continuous Glucose Sensor (DEXCOM G7 SENSOR) MISC [id number] Yes Change every 10 days Nooruddin, Jason Fila, MD Taking Active  Continuous Glucose Transmitter (DEXCOM G6 TRANSMITTER) MISC [id number] No Use for 90 days with Dexcom G6 sensors, Use to monitor your blood glucose continuously  Patient not taking: Reported on 06/13/2023   [name], DO Not Taking Active    insulin aspart (NOVOLOG FLEXPEN) 100 UNIT/ML FlexPen [id number] No Inject 12 Units into the skin 3 (three) times daily with meals.  Patient not taking: Reported on 06/13/2023   Rana Snare, DO Not Taking Active   insulin aspart (NOVOLOG) 100 UNIT/ML injection [id number] Yes Use to fill V-GO insulin pump [name], DO Taking Active   Insulin Disposable Pump (OMNIPOD 5 DEXG7G6 PODS GEN 5) MISC [id number] Yes Apply and use to administer up to 70 units of insulin daily as instructed Rana Snare, DO Taking Active   Insulin Disposable Pump (OMNIPOD 5 G6 INTRO, GEN 5,) KIT [id number] No Apply and use to administer up to 70 units of insulin daily as instructed  Patient not taking: Reported on 06/13/2023   [name], DO Not Taking Active   insulin glargine (LANTUS) 100 UNIT/ML Solostar Pen [id number] No Inject 30 Units into the skin at bedtime.  Patient not taking: Reported on 06/13/2023   Nooruddin, Jason Fila, MD Not Taking Active            Med Note FRANSSEN, CHERYL A   Mon Feb 28, 2023  2:26 PM) 25 units  Insulin Pen Needle (PEN NEEDLES) 32G X 4 MM MISC [id number] Yes Inject 1 each into the skin as needed. 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) 03/29/2023   Pain due to onychomycosis of toenails of both feet 02/11/2023   Sesamoiditis 02/11/2023   Capsulitis 02/11/2023   Chest pressure 11/02/2022   Rash and nonspecific skin eruption 10/18/2022   Weight loss 10/15/2022   Fecal incontinence 10/15/2022   Polysubstance use disorder 09/16/2022   Housing instability, currently housed, at risk for homelessness 09/16/2022   Tobacco use disorder 03/30/2021   Healthcare maintenance 03/30/2021   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 Folan is utilizing Omnipod and Dexcom to manage his BS. BS readings have improved. Scheduled with PCP on 06/27/23 for follow up. He has not checked BP since August. Taking more BP medication than prescribed.  RNCM Clinical Goal(s):  Patient will verbalize understanding of plan for management of DM as evidenced by patient reports take all medications exactly as prescribed and will call provider for medication related questions as evidenced by patient reports and EMR documentation    attend all scheduled medical appointments:  06/27/23 with PCP as evidenced by provider documentation        work  with Child psychotherapist to address needing vision and dental providers related to the management of DM as evidenced by review of EMR and patient or social worker report     through collaboration with Medical illustrator, provider, and care team.   Interventions: Inter-disciplinary care team collaboration (see longitudinal plan of care) Evaluation of current treatment plan related to  self management and patient's adherence to plan as established by provider  Hypertension Interventions:  (Status:  Goal on track:  Yes.) Long Term Goal-Has not checked BP, no recent readings Last practice recorded BP readings:  BP Readings from Last 3 Encounters:  03/29/23 (!) 156/117  02/23/23 (!) 152/107  01/24/23 (!) 184/112   Most recent eGFR/CrCl:   Lab Results  Component Value Date   EGFR 82 10/15/2022    No components found for: "CRCL"  Evaluation of current treatment plan related to hypertension self management and patient's adherence to plan as established by provider Provided education to patient re: stroke prevention, s/s of heart attack and stroke Counseled on the importance of exercise goals with target of 150 minutes per week Discussed plans with patient for ongoing care management follow up and provided patient with direct contact information for care management team Reviewed scheduled/upcoming provider appointments including:  Provided education on prescribed diet heart healthy Discussed complications of poorly controlled blood pressure such as heart disease, stroke, circulatory complications, vision complications, kidney impairment, sexual dysfunction Assessed social determinant of health barriers Discussed the importance of taking BP medication every day as instructed Collaborated with PCP requesting order for BP monitor-send to [facility] Advised patient to obtain BP monitor, check BP and record-take to upcoming PCP visit Advised patient to take all medication to PCP appointment Provided education on monitoring BP   Diabetes:  (Status: Goal on Track (progressing): YES.) Long Term Goal   Lab Results  Component Value Date   HGBA1C 13.8 (A) 01/24/2023   @ Assessed patient's understanding of A1c goal: <7% Reviewed medications with patient and discussed importance of medication adherence;        Reviewed prescribed diet with patient including a lean protein with each meal and snack; Counseled on importance of regular laboratory monitoring as prescribed;        Reviewed scheduled/upcoming provider appointments including: see list above;         Review of patient status, including review of consultants reports, relevant laboratory and other test results, and medications completed;       Assessed social determinant  of health barriers;        Discussed omnipod and verified that patient knows who to contact with questions or concerns Reviewed recent provider notes and discussed improvement with BS readings Provided encouragement   Patient Goals/Self-Care Activities: Take medications as prescribed   Attend all scheduled provider appointments Call provider office for new concerns or questions  Work with the social worker to address care coordination needs and will continue to work with the clinical team to address health care and disease management related needs schedule appointment with eye doctor check feet daily for cuts, sores or redness drink 6 to 8 glasses of water each day fill half of plate with vegetables       Follow Up:  Patient agrees to Care Plan and Follow-up.  Plan: The Managed Medicaid care management team will reach out to the patient again over the next 30 days.  Date/time of next scheduled RN care management/care coordination outreach:  07/14/23 at 10:30am  [name] RN, BSN  [facility]  Value-Based [facility] RN Care Coordinator [phone]

## 2023-06-19 ENCOUNTER — Other Ambulatory Visit: Payer: Self-pay | Admitting: Student

## 2023-06-19 MED ORDER — 3 SERIES BP MONITOR/WRIST DEVI: 1.0000 | Freq: Once | 0 refills | Status: AC

## 2023-06-20 ENCOUNTER — Other Ambulatory Visit: Payer: Self-pay

## 2023-06-20 ENCOUNTER — Other Ambulatory Visit: Payer: Self-pay | Admitting: *Deleted

## 2023-06-20 DIAGNOSIS — I10 Essential (primary) hypertension: Secondary | ICD-10-CM | POA: Diagnosis not present

## 2023-06-20 NOTE — Patient Outreach (Signed)
Medicaid Managed Care   Nurse Care Manager Note  06/20/2023 Name:  Ricky Boyle MRN:  [id number] DOB:  Jun 23, 1961  Ricky Boyle is an 62 y.o. year old male who is a primary patient of [name], MD.  [facility] Managed Care Coordination team was consulted for assistance with:    HTN  Ricky Boyle was given information about Medicaid Managed Care Coordination team services today. Ricky Boyle Patient agreed to services and verbal consent obtained.  Engaged with patient by telephone for follow up visit in response to provider referral for case management and/or care coordination services.   Assessments/Interventions:  Review of past medical history, allergies, medications, health status, including review of consultants reports, laboratory and other test data, was performed as part of comprehensive evaluation and provision of chronic care management services.  SDOH (Social Determinants of Health) assessments and interventions performed: SDOH Interventions    Flowsheet Row Patient Outreach Telephone from 04/11/2023 in [place] POPULATION HEALTH DEPARTMENT Patient Outreach Telephone from 02/07/2023 in [place] POPULATION HEALTH DEPARTMENT Office Visit from 01/24/2023 in [facility] [place] Patient Outreach Telephone from 01/04/2023 in [place] POPULATION HEALTH DEPARTMENT Patient Outreach Telephone from 12/02/2022 in [place] POPULATION HEALTH DEPARTMENT Patient Outreach Telephone from 09/29/2022 in [place] POPULATION HEALTH DEPARTMENT  SDOH Interventions        Food Insecurity Interventions -- -- Intervention Not Indicated -- -- Intervention Not Indicated  Housing Interventions -- Intervention Not Indicated  [Patient's daughter is helping with housing. Should be in housing by the end of July] Intervention Not Indicated -- -- --  Transportation Interventions Payor Benefit -- -- Payor Benefit  [Advised to use [facility] transportation  and contact pharmacy for prescription delivery] Intervention Not Indicated Intervention Not Indicated       Care Plan  Allergies  Allergen Reactions   Bee Venom Anaphylaxis   Shrimp [Shellfish Allergy] Anaphylaxis   Metformin And Related Other (See Comments)    Sharp kidney pains, body felt bad   Penicillins Other (See Comments)    Pt does not know what the reaction was    Medications Reviewed Today   Medications were not reviewed in this encounter     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) 03/29/2023   Pain due to onychomycosis of toenails of both feet 02/11/2023   Sesamoiditis 02/11/2023   Capsulitis 02/11/2023   Chest pressure 11/02/2022   Rash and nonspecific skin eruption 10/18/2022   Weight loss 10/15/2022   Fecal incontinence 10/15/2022   Polysubstance use disorder 09/16/2022   Housing instability, currently housed, at risk for homelessness 09/16/2022   Tobacco use disorder 03/30/2021   Healthcare maintenance 03/30/2021   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) 05/09/2020   Essential hypertension 04/20/2020   Pancreatitis 04/17/2020   OSA (obstructive sleep apnea) 06/16/2016    Conditions to be addressed/monitored per PCP order:  HTN  Care Plan : RN Care Manager Plan of Care  Updates made by [name], RN since 06/20/2023 12:00 AM     Problem: Health Management needs related to DM      Long-Range Goal: Development of Plan of Care to address Health Management needs related to DM   Start Date: 09/29/2022  Expected End Date: 06/13/2023  Note:   Current Barriers:  Chronic Disease Management  support and education needs related to DM Ricky Boyle is utilizing Omnipod and Dexcom to manage his BS. BS readings have improved. Scheduled with PCP on 06/27/23 for follow up. He has not checked  BP since August. Taking more BP medication than prescribed.  RNCM Clinical Goal(s):  Patient will verbalize understanding of plan for management of DM as evidenced by patient reports take all medications exactly as prescribed and will call provider for medication related questions as evidenced by patient reports and EMR documentation    attend all scheduled medical appointments:  06/27/23 with PCP as evidenced by provider documentation        work with Child psychotherapist to address needing vision and dental providers related to the management of DM as evidenced by review of EMR and patient or Child psychotherapist report     through collaboration with Medical illustrator, provider, and care team.   Interventions: Inter-disciplinary care team collaboration (see longitudinal plan of care) Evaluation of current treatment plan related to  self management and patient's adherence to plan as established by provider  Hypertension Interventions:  (Status:  Goal on track:  Yes.) Long Term Goal-Has not checked BP, no recent readings Last practice recorded BP readings:  BP Readings from Last 3 Encounters:  03/29/23 (!) 156/117  02/23/23 (!) 152/107  01/24/23 (!) 184/112   Most recent eGFR/CrCl:  Lab Results  Component Value Date   EGFR 82 10/15/2022    No components found for: "CRCL"  Evaluation of current treatment plan related to hypertension self management and patient's adherence to plan as established by provider Provided education to patient re: stroke prevention, s/s of heart attack and stroke Counseled on the importance of exercise goals with target of 150 minutes per week Discussed plans with patient for ongoing care management follow up and provided patient with direct contact information for care management team Reviewed scheduled/upcoming provider appointments including:  Provided education on prescribed diet heart healthy Discussed complications of poorly controlled blood pressure such as heart  disease, stroke, circulatory complications, vision complications, kidney impairment, sexual dysfunction Assessed social determinant of health barriers Discussed the importance of taking BP medication every day as instructed Collaborated with PCP requesting order for BP monitor-send to [facility]-updated patient on BP monitor ordered by Dr. Carron Brazen Advised patient to obtain BP monitor, check BP and record-take recorded readings to upcoming PCP visit Advised patient to check BP 1-2 hours after taking BP medication Advised patient to take all medication to PCP appointment Provided education on monitoring BP   Diabetes:  (Status: Condition stable. Not addressed this visit.) Long Term Goal   Lab Results  Component Value Date   HGBA1C 13.8 (A) 01/24/2023   @ Assessed patient's understanding of A1c goal: <7% Reviewed medications with patient and discussed importance of medication adherence;        Reviewed prescribed diet with patient including a lean protein with each meal and snack; Counseled on importance of regular laboratory monitoring as prescribed;        Reviewed scheduled/upcoming provider appointments including: see list above;         Review of patient status, including review of consultants reports, relevant laboratory and other test results, and medications completed;       Assessed social determinant of health barriers;        Discussed omnipod and verified that patient knows who to contact with questions or concerns Reviewed recent provider notes and discussed improvement with BS readings Provided encouragement   Patient Goals/Self-Care  Activities: Take medications as prescribed   Attend all scheduled provider appointments Call provider office for new concerns or questions  Work with the social worker to address care coordination needs and will continue to work with the clinical team to address health care and disease management related needs schedule appointment with eye  doctor check feet daily for cuts, sores or redness drink 6 to 8 glasses of water each day fill half of plate with vegetables       Follow Up:  Patient agrees to Care Plan and Follow-up.  Plan: The Managed Medicaid care management team will reach out to the patient again over the next 30 days.  Date/time of next scheduled RN care management/care coordination outreach:  07/14/23 at 10:30am  [name] RN, BSN [place]  Value-Based Care Institute [facility] RN Care Coordinator [phone]

## 2023-06-20 NOTE — Patient Instructions (Signed)
Visit Information  [name] was given information about Medicaid Managed Care team care coordination services as a part of their [facility] Medicaid benefit. Luxton Salonen verbally consented to engagement with the [facility] Managed Care team.   If you are experiencing a medical emergency, please call 911 or report to your local emergency department or urgent care.   If you have a non-emergency medical problem during routine business hours, please contact your provider's office and ask to speak with a nurse.   For questions related to your [facility] health plan, please call: [phone] or go here:[email or web address]  If you would like to schedule transportation through your [facility] plan, please call the following number at least 2 days in advance of your appointment: [phone].   You can also use the MTM portal or MTM mobile app to manage your rides. Reimbursement for transportation is available through [facility]! For the portal, please go to [email or web address]  Call the [facility] Crisis Line at [phone], at any time, 24 hours a day, 7 days a week. If you are in danger or need immediate medical attention call 911.  If you would like help to quit smoking, call 1-800-QUIT-NOW ((405)644-0526) OR Espaol: 1-855-Djelo-Ya (4-132-440-1027) o para ms informacin haga clic aqu or Text READY to 253-664 to register via text  Mr. Klase,   Please see education materials related to HTN provided by MyChart link.  Patient verbalizes understanding of instructions and care plan provided today and agrees to view in MyChart. Active MyChart status and patient understanding of how to access instructions and care plan via MyChart confirmed with patient.     Telephone follow up appointment with Managed Medicaid care management team member scheduled for:07/14/23 at 10:30am  [name] RN, BSN [place]  Value-Based Care [facility] RN Care  Coordinator [phone]   Following is a copy of your plan of care:  Care Plan : RN Care Manager Plan of Care  Updates made by [name], RN since 06/20/2023 12:00 AM     Problem: Health Management needs related to DM      Long-Range Goal: Development of Plan of Care to address Health Management needs related to DM   Start Date: 09/29/2022  Expected End Date: 06/13/2023  Note:   Current Barriers:  Chronic Disease Management support and education needs related to DM Mr. Grilli is utilizing Omnipod and Dexcom to manage his BS. BS readings have improved. Scheduled with PCP on 06/27/23 for follow up. He has not checked BP since August. Taking more BP medication than prescribed.  RNCM Clinical Goal(s):  Patient will verbalize understanding of plan for management of DM as evidenced by patient reports take all medications exactly as prescribed and will call provider for medication related questions as evidenced by patient reports and EMR documentation    attend all scheduled medical appointments:  06/27/23 with PCP as evidenced by provider documentation        work with Child psychotherapist to address needing vision and dental providers related to the management of DM as evidenced by review of EMR and patient or Child psychotherapist report     through collaboration with Medical illustrator, provider, and care team.   Interventions: Inter-disciplinary care team collaboration (see longitudinal plan of care) Evaluation of current treatment plan related to  self management and patient's adherence to plan as established by provider  Hypertension Interventions:  (Status:  Goal on track:  Yes.) Long Term Goal-Has not checked BP, no recent readings Last  practice recorded BP readings:  BP Readings from Last 3 Encounters:  03/29/23 (!) 156/117  02/23/23 (!) 152/107  01/24/23 (!) 184/112   Most recent eGFR/CrCl:  Lab Results  Component Value Date   EGFR 82 10/15/2022    No components found for:  "CRCL"  Evaluation of current treatment plan related to hypertension self management and patient's adherence to plan as established by provider Provided education to patient re: stroke prevention, s/s of heart attack and stroke Counseled on the importance of exercise goals with target of 150 minutes per week Discussed plans with patient for ongoing care management follow up and provided patient with direct contact information for care management team Reviewed scheduled/upcoming provider appointments including:  Provided education on prescribed diet heart healthy Discussed complications of poorly controlled blood pressure such as heart disease, stroke, circulatory complications, vision complications, kidney impairment, sexual dysfunction Assessed social determinant of health barriers Discussed the importance of taking BP medication every day as instructed Collaborated with PCP requesting order for BP monitor-send to [facility]-updated patient on BP monitor ordered by Dr. Carron Brazen Advised patient to obtain BP monitor, check BP and record-take recorded readings to upcoming PCP visit Advised patient to check BP 1-2 hours after taking BP medication Advised patient to take all medication to PCP appointment Provided education on monitoring BP   Diabetes:  (Status: Condition stable. Not addressed this visit.) Long Term Goal   Lab Results  Component Value Date   HGBA1C 13.8 (A) 01/24/2023   @ Assessed patient's understanding of A1c goal: <7% Reviewed medications with patient and discussed importance of medication adherence;        Reviewed prescribed diet with patient including a lean protein with each meal and snack; Counseled on importance of regular laboratory monitoring as prescribed;        Reviewed scheduled/upcoming provider appointments including: see list above;         Review of patient status, including review of consultants reports, relevant laboratory and other test results, and  medications completed;       Assessed social determinant of health barriers;        Discussed omnipod and verified that patient knows who to contact with questions or concerns Reviewed recent provider notes and discussed improvement with BS readings Provided encouragement   Patient Goals/Self-Care Activities: Take medications as prescribed   Attend all scheduled provider appointments Call provider office for new concerns or questions  Work with the social worker to address care coordination needs and will continue to work with the clinical team to address health care and disease management related needs schedule appointment with eye doctor check feet daily for cuts, sores or redness drink 6 to 8 glasses of water each day fill half of plate with vegetables

## 2023-06-21 ENCOUNTER — Other Ambulatory Visit: Payer: Self-pay

## 2023-06-22 ENCOUNTER — Other Ambulatory Visit: Payer: Self-pay

## 2023-06-22 ENCOUNTER — Other Ambulatory Visit (HOSPITAL_COMMUNITY): Payer: Self-pay

## 2023-06-22 MED ORDER — AMLODIPINE-OLMESARTAN 10-40 MG PO TABS
1.0000 | ORAL_TABLET | Freq: Every day | ORAL | 3 refills | Status: DC
  Filled 2023-06-22: qty 90, 90d supply, fill #0

## 2023-06-22 NOTE — Telephone Encounter (Signed)
Medication sent to pharmacy  

## 2023-06-27 ENCOUNTER — Other Ambulatory Visit (HOSPITAL_COMMUNITY): Payer: Self-pay

## 2023-06-27 ENCOUNTER — Ambulatory Visit: Payer: Medicaid Other | Admitting: Student

## 2023-06-27 ENCOUNTER — Encounter: Payer: Self-pay | Admitting: Student

## 2023-06-27 DIAGNOSIS — J069 Acute upper respiratory infection, unspecified: Secondary | ICD-10-CM

## 2023-06-27 DIAGNOSIS — I10 Essential (primary) hypertension: Secondary | ICD-10-CM

## 2023-06-27 MED ORDER — AMLODIPINE-OLMESARTAN 10-40 MG PO TABS
1.0000 | ORAL_TABLET | Freq: Every day | ORAL | 3 refills | Status: DC
  Filled 2023-06-27 – 2023-07-26 (×2): qty 90, 90d supply, fill #0
  Filled 2023-10-23: qty 90, 90d supply, fill #1
  Filled 2024-01-23: qty 90, 90d supply, fill #2
  Filled 2024-04-10: qty 90, 90d supply, fill #3

## 2023-06-27 NOTE — Progress Notes (Signed)
   I connected with  Ricky Boyle on 06/27/23 by telephone and verified that I am speaking with the correct person using two identifiers.   I discussed the limitations of evaluation and management by telemedicine. The patient expressed understanding and agreed to proceed.  CC: HTN, Flu-like symptoms  This is a telephone encounter between Ricky Boyle and Ricky Boyle on 06/27/2023 for blood pressure and URI. The visit was conducted with the patient located at home and Ricky Boyle at [facility]. The patient's identity was confirmed using their DOB and current address. The patient has consented to being evaluated through a telephone encounter and understands the associated risks (an examination cannot be done and the patient may need to come in for an appointment) / benefits (allows the patient to remain at home, decreasing exposure to coronavirus). I personally spent 20 minutes on medical discussion.   HPI:  [name] is a 62 y.o. M with PMH as below.   Please see A&P for assessment of the patient's acute and chronic medical conditions.   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  Negative for abdominal pain, nausea/vomiting, chest pain, dyspnea, poor tolerance to food or drink, headaches, dizziness, vision changes.  Assessment & Plan:   Essential hypertension His chief concern is his blood pressure, which was recently measured at an outside facility at 170/110 and confirmed with home measurements  to be around 150/100.  He was told that he was at risk for stroking out.  He is, however, asymptomatic.  To address this he has started taking his Azor 10-40 twice daily instead of the prescribed once daily.  He has not noticed any improvement in his blood pressure since starting that back in October. - Will instruct him to return to once daily Azor, I doubt the extra dose has any ability to benefit him and may put him at risk, so have asked him to come to the lab visit tomorrow for a BMP and blood pressure check.  Anticipating high blood pressure, I would like to start hydrochlorothiazide, but will await blood pressure and lab results.  Viral upper respiratory tract infection Today's visit was a phone visit in the setting of his acute illness.  He is reporting flulike symptoms, no measured fever, there are cough, chills, and arthralgias.  Home COVID test was negative.  His grandkids have also been ill but are recovering.  He is taking Tylenol with satisfactory relief and is happy to continue that.  No concerns of gastrointestinal involvement. - Continue supportive therapy, Tylenol   Patient seen with Dr. Sol Blazing  Patient will get lab work and nurse visit for blood pressure tomorrow.  Will start hydrochlorothiazide if appropriate.  Will ask him return to the clinic within a few weeks for blood pressure control.  Ricky Boyle D.O. [facility] Internal Medicine  PGY-1 Pager: [phone] Date 06/27/2023  Time 11:38 AM

## 2023-06-27 NOTE — Assessment & Plan Note (Signed)
His chief concern is his blood pressure, which was recently measured at an outside facility at 170/110 and confirmed with home measurements to be around 150/100.  He was told that he was at risk for stroking out.  He is, however, asymptomatic.  To address this he has started taking his Azor 10-40 twice daily instead of the prescribed once daily.  He has not noticed any improvement in his blood pressure since starting that back in October. - Will instruct him to return to once daily Azor, I doubt the extra dose has any ability to benefit him and may put him at risk, so have asked him to come to the lab visit tomorrow for a BMP and blood pressure check.  Anticipating high blood pressure, I would like to start hydrochlorothiazide, but will await blood pressure and lab results.

## 2023-06-27 NOTE — Assessment & Plan Note (Signed)
Today's visit was a phone visit in the setting of his acute illness.  He is reporting flulike symptoms, no measured fever, there are cough, chills, and arthralgias.  Home COVID test was negative.  His grandkids have also been ill but are recovering.  He is taking Tylenol with satisfactory relief and is happy to continue that.  No concerns of gastrointestinal involvement. - Continue supportive therapy, Tylenol

## 2023-06-27 NOTE — Addendum Note (Signed)
Addended by: Dickie La on: 06/27/2023 03:38 PM   Modules accepted: Level of Service

## 2023-06-27 NOTE — Progress Notes (Deleted)
Internal Medicine Clinic Attending  I was physically present during the key portions of the resident provided service and participated in the medical decision making of patient's management care. I reviewed pertinent patient test results.  The assessment, diagnosis, and plan were formulated together and I agree with the documentation in the resident's note.  Lau, Grace, MD  

## 2023-07-03 DIAGNOSIS — Z419 Encounter for procedure for purposes other than remedying health state, unspecified: Secondary | ICD-10-CM | POA: Diagnosis not present

## 2023-07-07 ENCOUNTER — Other Ambulatory Visit: Payer: Self-pay

## 2023-07-07 ENCOUNTER — Other Ambulatory Visit (HOSPITAL_COMMUNITY): Payer: Self-pay

## 2023-07-14 ENCOUNTER — Other Ambulatory Visit: Payer: Medicaid Other | Admitting: *Deleted

## 2023-07-14 NOTE — Patient Instructions (Signed)
Visit Information  Ricky Boyle was given information about Medicaid Managed Care team care coordination services as a part of their [facility] Medicaid benefit. Kei Sarwar verbally consented to engagement with the [facility] Managed Care team.   If you are experiencing a medical emergency, please call 911 or report to your local emergency department or urgent care.   If you have a non-emergency medical problem during routine business hours, please contact your provider's office and ask to speak with a nurse.   For questions related to your [facility] health plan, please call: [phone] or go here:[email or web address]  If you would like to schedule transportation through your [facility] plan, please call the following number at least 2 days in advance of your appointment: [phone].   You can also use the MTM portal or MTM mobile app to manage your rides. Reimbursement for transportation is available through [facility]! For the portal, please go to [email or web address]  Call the [facility] Crisis Line at [phone], at any time, 24 hours a day, 7 days a week. If you are in danger or need immediate medical attention call 911.  If you would like help to quit smoking, call 1-800-QUIT-NOW (4097479860) OR Espaol: 1-855-Djelo-Ya (4-132-440-1027) o para ms informacin haga clic aqu or Text READY to 253-664 to register via text  Mr. Mcindoe,   Please see education materials related to neuropathy provided by MyChart link.  Patient verbalizes understanding of instructions and care plan provided today and agrees to view in MyChart. Active MyChart status and patient understanding of how to access instructions and care plan via MyChart confirmed with patient.     Telephone follow up appointment with Managed Medicaid care management team member scheduled for:08/15/23 at 11:15am  [name] RN, BSN [place]  Value-Based Care Institute [facility] RN Care  Coordinator [phone]   Following is a copy of your plan of care:  Care Plan : RN Care Manager Plan of Care  Updates made by [name], RN since 07/14/2023 12:00 AM     Problem: Health Management needs related to DM      Long-Range Goal: Development of Plan of Care to address Health Management needs related to DM   Start Date: 09/29/2022  Expected End Date: 06/13/2023  Note:   Current Barriers:  Chronic Disease Management support and education needs related to DM Ricky Boyle is utilizing Omnipod and Dexcom to manage his BS. BS readings have improved. Scheduled with PCP on 06/27/23 for follow up. He has not checked BP since August. Taking more BP medication than prescribed.  RNCM Clinical Goal(s):  Patient will verbalize understanding of plan for management of DM as evidenced by patient reports take all medications exactly as prescribed and will call provider for medication related questions as evidenced by patient reports and EMR documentation    attend all scheduled medical appointments:  06/27/23 with PCP as evidenced by provider documentation        work with Child psychotherapist to address needing vision and dental providers related to the management of DM as evidenced by review of EMR and patient or Child psychotherapist report     through collaboration with Medical illustrator, provider, and care team.   Interventions: Inter-disciplinary care team collaboration (see longitudinal plan of care) Evaluation of current treatment plan related to  self management and patient's adherence to plan as established by provider  Hypertension Interventions:  (Status:  Goal on track:  Yes.) Long Term Goal-Patient is out of Amlodipine, checking BP at  home, recent reading 135/90. Needs to have BMP drawn and BP check in the office. Last practice recorded BP readings:  BP Readings from Last 3 Encounters:  03/29/23 (!) 156/117  02/23/23 (!) 152/107  01/24/23 (!) 184/112   Most recent eGFR/CrCl:  Lab  Results  Component Value Date   EGFR 82 10/15/2022    No components found for: "CRCL"  Evaluation of current treatment plan related to hypertension self management and patient's adherence to plan as established by provider Provided education to patient re: stroke prevention, s/s of heart attack and stroke Counseled on the importance of exercise goals with target of 150 minutes per week Discussed plans with patient for ongoing care management follow up and provided patient with direct contact information for care management team Reviewed scheduled/upcoming provider appointments including:  Provided education on prescribed diet heart healthy Discussed complications of poorly controlled blood pressure such as heart disease, stroke, circulatory complications, vision complications, kidney impairment, sexual dysfunction Assessed social determinant of health barriers Discussed the importance of taking BP medication every day as instructed Verified patient received BP monitor and understands how to use Advised patient to obtain BP monitor, check BP and record-take recorded readings to upcoming PCP visit Advised patient to check BP 1-2 hours after taking BP medication Advised patient to follow up with lab work and BP check in the PCP office   Diabetes:  (Status: Goal on Track (progressing): YES.) Long Term Goal BS readings have greatly improved since using Omnipod. No readings over 200.  Lab Results  Component Value Date   HGBA1C 13.8 (A) 01/24/2023   @ Assessed patient's understanding of A1c goal: <7% Reviewed medications with patient and discussed importance of medication adherence;        Reviewed prescribed diet with patient including a lean protein with each meal and snack; Counseled on importance of regular laboratory monitoring as prescribed;        Reviewed scheduled/upcoming provider appointments including: see list above;         Review of patient status, including review of  consultants reports, relevant laboratory and other test results, and medications completed;       Assessed social determinant of health barriers;        Discussed omnipod and verified that patient knows who to contact with questions or concerns Reviewed recent provider notes and discussed improvement with BS readings Provided encouragement  Discussed neuropathy Collaborated with PCP, requesting prescription for duloxetine as discussed during visit in September   Patient Goals/Self-Care Activities: Take medications as prescribed   Attend all scheduled provider appointments Call provider office for new concerns or questions  Work with the social worker to address care coordination needs and will continue to work with the clinical team to address health care and disease management related needs schedule appointment with eye doctor check feet daily for cuts, sores or redness drink 6 to 8 glasses of water each day fill half of plate with vegetables

## 2023-07-14 NOTE — Patient Outreach (Signed)
Medicaid Managed Care   Nurse Care Manager Note  07/14/2023 Name:  Ricky Boyle MRN:  [id number] DOB:  1960-12-26  Ricky Boyle is an 62 y.o. year old male who is a primary patient of [name], MD.  The [facility] Managed Care Coordination team was consulted for assistance with:    HTN DMI  Ricky Boyle was given information about Medicaid Managed Care Coordination team services today. [name] Patient agreed to services and verbal consent obtained.  Engaged with patient by telephone for follow up visit in response to provider referral for case management and/or care coordination services.   Patient is participating in a Managed Medicaid Plan:  Yes  Assessments/Interventions:  Review of past medical history, allergies, medications, health status, including review of consultants reports, laboratory and other test data, was performed as part of comprehensive evaluation and provision of chronic care management services.  SDOH (Social Drivers of Health) assessments and interventions performed: SDOH Interventions    Flowsheet Row Patient Outreach Telephone from 04/11/2023 in [place] POPULATION HEALTH DEPARTMENT Patient Outreach Telephone from 02/07/2023 in [place] POPULATION HEALTH DEPARTMENT Office Visit from 01/24/2023 in [facility] Patient Outreach Telephone from 01/04/2023 in [place] POPULATION HEALTH DEPARTMENT Patient Outreach Telephone from 12/02/2022 in [place] POPULATION HEALTH DEPARTMENT Patient Outreach Telephone from 09/29/2022 in [place] POPULATION HEALTH DEPARTMENT  SDOH Interventions        Food Insecurity Interventions -- -- Intervention Not Indicated -- -- Intervention Not Indicated  Housing Interventions -- Intervention Not Indicated  [Patient's daughter is helping with housing. Should be in housing by the end of July] Intervention Not Indicated -- -- --  Transportation Interventions Payor Benefit  -- -- Payor Benefit  [Advised to use [facility] transportation and contact pharmacy for prescription delivery] Intervention Not Indicated Intervention Not Indicated       Care Plan  Allergies  Allergen Reactions   Bee Venom Anaphylaxis   Shrimp [Shellfish Allergy] Anaphylaxis   Metformin And Related Other (See Comments)    Sharp kidney pains, body felt bad   Penicillins Other (See Comments)    Pt does not know what the reaction was    Medications Reviewed Today     Reviewed by [name], RN (Registered Nurse) on 07/14/23 at 1102  Med List Status: <None>   Medication Order Taking? Sig Documenting Provider Last Dose Status Informant  amLODipine-olmesartan (AZOR) 10-40 MG tablet [id number] No Take 1 tablet by mouth daily.  Patient not taking: Reported on 07/14/2023   [name], DO Not Taking Active   Blood Glucose Monitoring Suppl [facility] VERIO) w/Device KIT [id number]  Use to check blood sugar. Belva Agee, MD  Active   buPROPion (WELLBUTRIN XL) 150 MG 24 hr tablet [id number] No Take 1 tablet (150 mg total) by mouth daily.  Patient not taking: Reported on 07/14/2023   Rana Snare, DO Not Taking Active   cetaphil (CETAPHIL) lotion [id number] No Apply 1 Application topically as needed for dry skin.  Patient not taking: Reported on 07/14/2023   Belva Agee, MD Not Taking Active   Continuous Glucose Receiver [facility] G6 RECEIVER) DEVI [id number] No Use to monitor your blood glucose continuously  Patient not taking: Reported on 07/14/2023   Earl Lagos, MD Not Taking Active   Continuous Glucose Sensor (DEXCOM G7 SENSOR) MISC [id number] Yes Change every 10 days Nooruddin, Jason Fila, MD Taking Active   Continuous Glucose Transmitter (DEXCOM G6 TRANSMITTER) [place] [id number]  No Use for 90 days with Dexcom G6 sensors, Use to monitor your blood glucose continuously  Patient not taking: Reported on 07/14/2023   [name], DO Not Taking Active    insulin aspart (NOVOLOG FLEXPEN) 100 UNIT/ML FlexPen [id number] No Inject 12 Units into the skin 3 (three) times daily with meals.  Patient not taking: Reported on 07/14/2023   Rana Snare, DO Not Taking Active   insulin aspart (NOVOLOG) 100 UNIT/ML injection [id number] Yes Use to fill V-GO insulin pump [name], DO Taking Active   Insulin Disposable Pump (OMNIPOD 5 DEXG7G6 PODS GEN 5) MISC [id number] Yes Apply and use to administer up to 70 units of insulin daily as instructed Rana Snare, DO Taking Active   Insulin Disposable Pump (OMNIPOD 5 G6 INTRO, GEN 5,) KIT [id number] No Apply and use to administer up to 70 units of insulin daily as instructed  Patient not taking: Reported on 07/14/2023   [name], DO Not Taking Active   insulin glargine (LANTUS) 100 UNIT/ML Solostar Pen [id number] No Inject 30 Units into the skin at bedtime.  Patient not taking: Reported on 07/14/2023   NooruddinJason Fila, MD Not Taking Active            Med Note TIET, CHERYL A   Mon Feb 28, 2023  2:26 PM) 25 units  Insulin Pen Needle (PEN NEEDLES) 32G X 4 MM MISC [id number] Yes Inject 1 each into the skin as needed. 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) 03/29/2023   Pain due to onychomycosis of toenails of both feet 02/11/2023   Sesamoiditis 02/11/2023   Capsulitis 02/11/2023   Chest pressure 11/02/2022   Rash and nonspecific skin eruption 10/18/2022   Weight loss 10/15/2022   Fecal incontinence 10/15/2022   Polysubstance use disorder 09/16/2022   Housing instability, currently housed, at risk for homelessness 09/16/2022   Tobacco use disorder 03/30/2021   Healthcare maintenance 03/30/2021   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) 05/09/2020   Essential  hypertension 04/20/2020   Pancreatitis 04/17/2020   OSA (obstructive sleep apnea) 06/16/2016    Conditions to be addressed/monitored per PCP order:  HTN and DMI  Care Plan : RN Care Manager Plan of Care  Updates made by [name], RN since 07/14/2023 12:00 AM     Problem: Health Management needs related to DM      Long-Range Goal: Development of Plan of Care to address Health Management needs related to DM   Start Date: 09/29/2022  Expected End Date: 06/13/2023  Note:   Current Barriers:  Chronic Disease Management support and education needs related to DM Ricky Boyle is utilizing Omnipod and Dexcom to manage his BS. BS readings have improved. Scheduled with PCP on 06/27/23 for follow up. He has not checked BP since August. Taking more BP medication than prescribed.  RNCM Clinical Goal(s):  Patient will verbalize understanding of plan for management of DM as evidenced by patient reports take all medications exactly as prescribed and will call provider for medication related questions as evidenced by patient reports and EMR documentation    attend all scheduled medical appointments:  06/27/23 with PCP as evidenced by provider documentation        work with Child psychotherapist to address needing vision and  dental providers related to the management of DM as evidenced by review of EMR and patient or social worker report     through collaboration with Medical illustrator, provider, and care team.   Interventions: Inter-disciplinary care team collaboration (see longitudinal plan of care) Evaluation of current treatment plan related to  self management and patient's adherence to plan as established by provider  Hypertension Interventions:  (Status:  Goal on track:  Yes.) Long Term Goal-Patient is out of Amlodipine, checking BP at home, recent reading 135/90. Needs to have BMP drawn and BP check in the office. Last practice recorded BP readings:  BP Readings from Last 3 Encounters:  03/29/23 (!)  156/117  02/23/23 (!) 152/107  01/24/23 (!) 184/112   Most recent eGFR/CrCl:  Lab Results  Component Value Date   EGFR 82 10/15/2022    No components found for: "CRCL"  Evaluation of current treatment plan related to hypertension self management and patient's adherence to plan as established by provider Provided education to patient re: stroke prevention, s/s of heart attack and stroke Counseled on the importance of exercise goals with target of 150 minutes per week Discussed plans with patient for ongoing care management follow up and provided patient with direct contact information for care management team Reviewed scheduled/upcoming provider appointments including:  Provided education on prescribed diet heart healthy Discussed complications of poorly controlled blood pressure such as heart disease, stroke, circulatory complications, vision complications, kidney impairment, sexual dysfunction Assessed social determinant of health barriers Discussed the importance of taking BP medication every day as instructed Verified patient received BP monitor and understands how to use Advised patient to obtain BP monitor, check BP and record-take recorded readings to upcoming PCP visit Advised patient to check BP 1-2 hours after taking BP medication Advised patient to follow up with lab work and BP check in the PCP office   Diabetes:  (Status: Goal on Track (progressing): YES.) Long Term Goal BS readings have greatly improved since using Omnipod. No readings over 200.  Lab Results  Component Value Date   HGBA1C 13.8 (A) 01/24/2023   @ Assessed patient's understanding of A1c goal: <7% Reviewed medications with patient and discussed importance of medication adherence;        Reviewed prescribed diet with patient including a lean protein with each meal and snack; Counseled on importance of regular laboratory monitoring as prescribed;        Reviewed scheduled/upcoming provider appointments  including: see list above;         Review of patient status, including review of consultants reports, relevant laboratory and other test results, and medications completed;       Assessed social determinant of health barriers;        Discussed omnipod and verified that patient knows who to contact with questions or concerns Reviewed recent provider notes and discussed improvement with BS readings Provided encouragement  Discussed neuropathy Collaborated with PCP, requesting prescription for duloxetine as discussed during visit in September   Patient Goals/Self-Care Activities: Take medications as prescribed   Attend all scheduled provider appointments Call provider office for new concerns or questions  Work with the social worker to address care coordination needs and will continue to work with the clinical team to address health care and disease management related needs schedule appointment with eye doctor check feet daily for cuts, sores or redness drink 6 to 8 glasses of water each day fill half of plate with vegetables       Follow  Up:  Patient agrees to Care Plan and Follow-up.  Plan: The Managed Medicaid care management team will reach out to the patient again over the next 30 days.  Date/time of next scheduled RN care management/care coordination outreach:  08/15/23 at 11:15am  [name] RN, BSN [place]  Value-Based Care [facility] Health RN Care Coordinator [phone]

## 2023-07-20 ENCOUNTER — Encounter: Payer: Medicaid Other | Admitting: Student

## 2023-07-20 NOTE — Progress Notes (Deleted)
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

## 2023-07-21 ENCOUNTER — Other Ambulatory Visit (HOSPITAL_COMMUNITY): Payer: Self-pay

## 2023-07-21 ENCOUNTER — Other Ambulatory Visit: Payer: Self-pay | Admitting: Student

## 2023-07-21 MED ORDER — DULOXETINE HCL 20 MG PO CPEP
20.0000 mg | ORAL_CAPSULE | Freq: Every day | ORAL | 1 refills | Status: DC
  Filled 2023-07-21: qty 30, 30d supply, fill #0

## 2023-07-26 ENCOUNTER — Other Ambulatory Visit: Payer: Self-pay

## 2023-07-26 ENCOUNTER — Other Ambulatory Visit (HOSPITAL_COMMUNITY): Payer: Self-pay

## 2023-07-29 ENCOUNTER — Other Ambulatory Visit (HOSPITAL_COMMUNITY): Payer: Self-pay

## 2023-08-01 ENCOUNTER — Other Ambulatory Visit: Payer: Self-pay

## 2023-08-03 DIAGNOSIS — Z419 Encounter for procedure for purposes other than remedying health state, unspecified: Secondary | ICD-10-CM | POA: Diagnosis not present

## 2023-08-04 ENCOUNTER — Other Ambulatory Visit (HOSPITAL_COMMUNITY): Payer: Self-pay

## 2023-08-04 ENCOUNTER — Other Ambulatory Visit: Payer: Self-pay

## 2023-08-04 ENCOUNTER — Other Ambulatory Visit: Payer: Self-pay | Admitting: Student

## 2023-08-04 DIAGNOSIS — E1169 Type 2 diabetes mellitus with other specified complication: Secondary | ICD-10-CM

## 2023-08-08 ENCOUNTER — Other Ambulatory Visit (HOSPITAL_COMMUNITY): Payer: Self-pay

## 2023-08-08 ENCOUNTER — Other Ambulatory Visit: Payer: Self-pay

## 2023-08-08 MED ORDER — OMNIPOD 5 DEXG7G6 PODS GEN 5 MISC
1.0000 | Freq: Every day | 3 refills | Status: DC
  Filled 2023-08-08: qty 10, 30d supply, fill #0
  Filled 2023-09-22: qty 10, 30d supply, fill #1
  Filled 2023-10-23 – 2023-11-23 (×2): qty 10, 30d supply, fill #2

## 2023-08-08 NOTE — Telephone Encounter (Signed)
 Medication sent to pharmacy

## 2023-08-10 ENCOUNTER — Encounter: Payer: Medicaid Other | Admitting: Internal Medicine

## 2023-08-12 ENCOUNTER — Ambulatory Visit (HOSPITAL_BASED_OUTPATIENT_CLINIC_OR_DEPARTMENT_OTHER): Payer: Medicaid Other | Admitting: Cardiology

## 2023-08-15 ENCOUNTER — Other Ambulatory Visit: Payer: Self-pay | Admitting: *Deleted

## 2023-08-15 NOTE — Patient Instructions (Signed)
 Visit Information  Ricky Boyle was given information about Medicaid Managed Care team care coordination services as a part of their [facility] Medicaid benefit. Ricky Boyle verbally consented to engagement with the [facility] Managed Care team.   If you are experiencing a medical emergency, please call 911 or report to your local emergency department or urgent care.   If you have a non-emergency medical problem during routine business hours, please contact your provider's office and ask to speak with a nurse.   For questions related to your [facility] health plan, please call: [phone] or go here:[email or web address]  If you would like to schedule transportation through your [facility] plan, please call the following number at least 2 days in advance of your appointment: [phone].   You can also use the MTM portal or MTM mobile app to manage your rides. Reimbursement for transportation is available through [facility]! For the portal, please go to [email or web address]  Call the [facility] Crisis Line at [phone], at any time, 24 hours a day, 7 days a week. If you are in danger or need immediate medical attention call 911.  If you would like help to quit smoking, call 1-800-QUIT-NOW (959-139-5736) OR Espaol: 1-855-Djelo-Ya (8-144-664-6430) o para ms informacin haga clic aqu or Text READY to 799-599 to register via text  Ricky Boyle - following are the goals we discussed in your visit today:   Goals Addressed   None     Please see education materials related to Diabetes provided by MyChart link.  Patient verbalizes understanding of instructions and care plan provided today and agrees to view in MyChart. Active MyChart status and patient understanding of how to access instructions and care plan via MyChart confirmed with patient.     Telephone follow up appointment with Managed Medicaid care management team member scheduled for:10/17/23 at  10:30am  Andrea Dimes RN, BSN   Value-Based Care [facility] Health RN Care Manager [phone]   Following is a copy of your plan of care:  Care Plan : RN Care Manager Plan of Care  Updates made by Dimes Andrea LABOR, RN since 08/15/2023 12:00 AM     Problem: Health Management needs related to DM      Long-Range Goal: Development of Plan of Care to address Health Management needs related to DM   Start Date: 09/29/2022  Expected End Date: 06/13/2023  Note:   Current Barriers:  Chronic Disease Management support and education needs related to DM Ricky Boyle is utilizing Omnipod and Dexcom to manage his BS. BS readings have improved. Scheduled with PCP on 06/27/23 for follow up. He has not checked BP since August. Taking more BP medication than prescribed.  RNCM Clinical Goal(s):  Patient will verbalize understanding of plan for management of DM as evidenced by patient reports take all medications exactly as prescribed and will call provider for medication related questions as evidenced by patient reports and EMR documentation    attend all scheduled medical appointments:  06/27/23 with PCP as evidenced by provider documentation        work with child psychotherapist to address needing vision and dental providers related to the management of DM as evidenced by review of EMR and patient or child psychotherapist report     through collaboration with Medical Illustrator, provider, and care team.   Interventions: Inter-disciplinary care team collaboration (see longitudinal plan of care) Evaluation of current treatment plan related to  self management and patient's adherence to plan as established by provider  Hypertension Interventions:  (Status:  Goal on track:  Yes.) Long Term Goal-Taking Amlodipine , missed appointment with Cardiology, needs to reschedule Last practice recorded BP readings:  BP Readings from Last 3 Encounters:  03/29/23 (!) 156/117  02/23/23 (!) 152/107  01/24/23 (!)  184/112   Most recent eGFR/CrCl:  Lab Results  Component Value Date   EGFR 82 10/15/2022    No components found for: CRCL  Evaluation of current treatment plan related to hypertension self management and patient's adherence to plan as established by provider Provided education to patient re: stroke prevention, s/s of heart attack and stroke Counseled on the importance of exercise goals with target of 150 minutes per week Discussed plans with patient for ongoing care management follow up and provided patient with direct contact information for care management team Reviewed scheduled/upcoming provider appointments including:  Provided education on prescribed diet heart healthy Discussed complications of poorly controlled blood pressure such as heart disease, stroke, circulatory complications, vision complications, kidney impairment, sexual dysfunction Assessed social determinant of health barriers Discussed the importance of taking BP medication every day as instructed Verified patient received BP monitor and understands how to use Advised patient to obtain BP monitor, check BP and record-take recorded readings to upcoming PCP visit Advised patient to check BP 1-2 hours after taking BP medication Advised patient to follow up with lab work and BP check in the PCP office Collaborated with Cardiology to reschedule missed appointment   Diabetes:  (Status: Goal on Track (progressing): YES.) Long Term Goal BS readings have greatly improved since using Omnipod. No readings over 200.  Lab Results  Component Value Date   HGBA1C 13.8 (A) 01/24/2023   @ Assessed patient's understanding of A1c goal: <7% Reviewed medications with patient and discussed importance of medication adherence;        Reviewed prescribed diet with patient including a lean protein with each meal and snack; Counseled on importance of regular laboratory monitoring as prescribed;        Reviewed scheduled/upcoming provider  appointments including: see list above;         Review of patient status, including review of consultants reports, relevant laboratory and other test results, and medications completed;       Assessed social determinant of health barriers;        Discussed omnipod and verified that patient knows who to contact with questions or concerns Provided encouragement  Discussed neuropathy Discussed the importance of attending scheduled appointments and having routine labwork Provided education on DM management   Patient Goals/Self-Care Activities: Take medications as prescribed   Attend all scheduled provider appointments Call provider office for new concerns or questions  Work with the social worker to address care coordination needs and will continue to work with the clinical team to address health care and disease management related needs schedule appointment with eye doctor check feet daily for cuts, sores or redness drink 6 to 8 glasses of water each day fill half of plate with vegetables

## 2023-08-15 NOTE — Patient Outreach (Signed)
 Medicaid Managed Care   Nurse Care Manager Note  08/15/2023 Name:  Ricky Boyle MRN:  [id number] DOB:  02/24/1961  Ricky Boyle is an 63 y.o. year old male who is a primary patient of Celestina Czar, MD.  The [facility] Managed Care Coordination team was consulted for assistance with:    HTN DMI  [name] was given information about Medicaid Managed Care Coordination team services today. Deanna Molly Patient agreed to services and verbal consent obtained.  Engaged with patient by telephone for follow up visit in response to provider referral for case management and/or care coordination services.   Patient is participating in a Managed Medicaid Plan:  Yes  Assessments/Interventions:  Review of past medical history, allergies, medications, health status, including review of consultants reports, laboratory and other test data, was performed as part of comprehensive evaluation and provision of chronic care management services.  SDOH (Social Drivers of Health) assessments and interventions performed: SDOH Interventions    Flowsheet Row Patient Outreach Telephone from 08/15/2023 in [place] POPULATION HEALTH DEPARTMENT Patient Outreach Telephone from 04/11/2023 in [place] POPULATION HEALTH DEPARTMENT Patient Outreach Telephone from 02/07/2023 in [place] POPULATION HEALTH DEPARTMENT Office Visit from 01/24/2023 in [facility] [place] Patient Outreach Telephone from 01/04/2023 in [place] POPULATION HEALTH DEPARTMENT Patient Outreach Telephone from 12/02/2022 in [place] POPULATION HEALTH DEPARTMENT  SDOH Interventions        Food Insecurity Interventions Intervention Not Indicated -- -- Intervention Not Indicated -- --  Housing Interventions Patient Declined -- Intervention Not Indicated  [Patient's daughter is helping with housing. Should be in housing by the end of July] Intervention Not Indicated -- --  Transportation Interventions  Payor Benefit Payor Benefit -- -- Payor Benefit  [Advised to use [facility] transportation and contact pharmacy for prescription delivery] Intervention Not Indicated       Care Plan  Allergies  Allergen Reactions   Bee Venom Anaphylaxis   Shrimp [Shellfish Allergy] Anaphylaxis   Metformin  And Related Other (See Comments)    Sharp kidney pains, body felt bad   Penicillins Other (See Comments)    Pt does not know what the reaction was    Medications Reviewed Today     Reviewed by Lucky Andrea LABOR, RN (Registered Nurse) on 08/15/23 at 1128  Med List Status: <None>   Medication Order Taking? Sig Documenting Provider Last Dose Status Informant  amLODipine -olmesartan  (AZOR ) 10-40 MG tablet [id number] Yes Take 1 tablet by mouth daily. Harrie Bruckner, DO Taking Active   Blood Glucose Monitoring Suppl [facility] VERIO) w/Device KIT [id number] Yes Use to check blood sugar. Katsadouros, Vasilios, MD Taking Active   buPROPion  (WELLBUTRIN  XL) 150 MG 24 hr tablet [id number] No Take 1 tablet (150 mg total) by mouth daily.  Patient not taking: Reported on 06/13/2023   Elicia Sharper, DO Not Taking Active   cetaphil (CETAPHIL) lotion [id number] No Apply 1 Application topically as needed for dry skin.  Patient not taking: Reported on 01/04/2023   Katsadouros, Vasilios, MD Not Taking Active   Continuous Glucose Receiver (DEXCOM G6 RECEIVER) DEVI [id number] Yes Use to monitor your blood glucose continuously Narendra, Nischal, MD Taking Active   Continuous Glucose Sensor (DEXCOM G7 SENSOR) MISC [id number] Yes Change every 10 days Nooruddin, Saad, MD Taking Active   Continuous Glucose Transmitter (DEXCOM G6 TRANSMITTER) MISC [id number] Yes Use for 90 days with Dexcom G6 sensors, Use to monitor your blood glucose continuously Harrie Bruckner, DO Taking  Active   DULoxetine  (CYMBALTA ) 20 MG capsule [id number] Yes Take 1 capsule (20 mg total) by mouth daily. Celestina Czar, MD Taking Active   insulin  aspart  (NOVOLOG  FLEXPEN) 100 UNIT/ML FlexPen [id number] No Inject 12 Units into the skin 3 (three) times daily with meals.  Patient not taking: Reported on 06/13/2023   Zheng, Michael, DO Not Taking Active   insulin  aspart (NOVOLOG ) 100 UNIT/ML injection [id number] Yes Use to fill V-GO insulin  pump Harrie Bruckner, DO Taking Active   Insulin  Disposable Pump (OMNIPOD 5 DEXG7G6 PODS GEN 5) MISC [id number] Yes Apply and use to administer up to 70 units of insulin  daily as instructed Celestina Czar, MD Taking Active   Insulin  Disposable Pump (OMNIPOD 5 G6 INTRO, GEN 5,) KIT [id number]  Apply and use to administer up to 70 units of insulin  daily as instructed  Patient not taking: Reported on 07/14/2023   Harrie Bruckner, DO  Active   insulin  glargine (LANTUS ) 100 UNIT/ML Solostar Pen [id number] No Inject 30 Units into the skin at bedtime.  Patient not taking: Reported on 06/13/2023   Nooruddin, Saad, MD Not Taking Active            Med Note PADDOCK, CHERYL A   Mon Feb 28, 2023  2:26 PM) 25 units  Insulin  Pen Needle (PEN NEEDLES) 32G X 4 MM MISC [id number] Yes Inject 1 each into the skin as needed. 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) 03/29/2023   Pain due to onychomycosis of toenails of both feet 02/11/2023   Sesamoiditis 02/11/2023   Capsulitis 02/11/2023   Chest pressure 11/02/2022   Rash and nonspecific skin eruption 10/18/2022   Weight loss 10/15/2022   Fecal incontinence 10/15/2022   Polysubstance use disorder 09/16/2022   Housing instability, currently housed, at risk for homelessness 09/16/2022   Tobacco use disorder 03/30/2021   Healthcare maintenance 03/30/2021   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) 05/09/2020   Essential hypertension 04/20/2020    Pancreatitis 04/17/2020   OSA (obstructive sleep apnea) 06/16/2016    Conditions to be addressed/monitored per PCP order:  HTN and DMI  Care Plan : RN Care Manager Plan of Care  Updates made by Lucky Andrea LABOR, RN since 08/15/2023 12:00 AM     Problem: Health Management needs related to DM      Long-Range Goal: Development of Plan of Care to address Health Management needs related to DM   Start Date: 09/29/2022  Expected End Date: 06/13/2023  Note:   Current Barriers:  Chronic Disease Management support and education needs related to DM Mr. Temme is utilizing Omnipod and Dexcom to manage his BS. BS readings have improved. Scheduled with PCP on 06/27/23 for follow up. He has not checked BP since August. Taking more BP medication than prescribed.  RNCM Clinical Goal(s):  Patient will verbalize understanding of plan for management of DM as evidenced by patient reports take all medications exactly as prescribed and will call provider for medication related questions as evidenced by patient reports and EMR documentation    attend all scheduled medical appointments:  06/27/23 with PCP as evidenced by provider documentation        work with child psychotherapist to address needing vision and dental providers related to the management of DM  as evidenced by review of EMR and patient or social worker report     through collaboration with Medical Illustrator, provider, and care team.   Interventions: Inter-disciplinary care team collaboration (see longitudinal plan of care) Evaluation of current treatment plan related to  self management and patient's adherence to plan as established by provider  Hypertension Interventions:  (Status:  Goal on track:  Yes.) Long Term Goal-Taking Amlodipine , missed appointment with Cardiology, needs to reschedule Last practice recorded BP readings:  BP Readings from Last 3 Encounters:  03/29/23 (!) 156/117  02/23/23 (!) 152/107  01/24/23 (!) 184/112   Most recent  eGFR/CrCl:  Lab Results  Component Value Date   EGFR 82 10/15/2022    No components found for: CRCL  Evaluation of current treatment plan related to hypertension self management and patient's adherence to plan as established by provider Provided education to patient re: stroke prevention, s/s of heart attack and stroke Counseled on the importance of exercise goals with target of 150 minutes per week Discussed plans with patient for ongoing care management follow up and provided patient with direct contact information for care management team Reviewed scheduled/upcoming provider appointments including:  Provided education on prescribed diet heart healthy Discussed complications of poorly controlled blood pressure such as heart disease, stroke, circulatory complications, vision complications, kidney impairment, sexual dysfunction Assessed social determinant of health barriers Discussed the importance of taking BP medication every day as instructed Verified patient received BP monitor and understands how to use Advised patient to obtain BP monitor, check BP and record-take recorded readings to upcoming PCP visit Advised patient to check BP 1-2 hours after taking BP medication Advised patient to follow up with lab work and BP check in the PCP office Collaborated with Cardiology to reschedule missed appointment   Diabetes:  (Status: Goal on Track (progressing): YES.) Long Term Goal BS readings have greatly improved since using Omnipod. No readings over 200.  Lab Results  Component Value Date   HGBA1C 13.8 (A) 01/24/2023   @ Assessed patient's understanding of A1c goal: <7% Reviewed medications with patient and discussed importance of medication adherence;        Reviewed prescribed diet with patient including a lean protein with each meal and snack; Counseled on importance of regular laboratory monitoring as prescribed;        Reviewed scheduled/upcoming provider appointments including:  see list above;         Review of patient status, including review of consultants reports, relevant laboratory and other test results, and medications completed;       Assessed social determinant of health barriers;        Discussed omnipod and verified that patient knows who to contact with questions or concerns Provided encouragement  Discussed neuropathy Discussed the importance of attending scheduled appointments and having routine labwork Provided education on DM management   Patient Goals/Self-Care Activities: Take medications as prescribed   Attend all scheduled provider appointments Call provider office for new concerns or questions  Work with the social worker to address care coordination needs and will continue to work with the clinical team to address health care and disease management related needs schedule appointment with eye doctor check feet daily for cuts, sores or redness drink 6 to 8 glasses of water each day fill half of plate with vegetables       Follow Up:  Patient agrees to Care Plan and Follow-up.  Plan: The Managed Medicaid care management team will reach out to the  patient again over the next 60 days.  Date/time of next scheduled RN care management/care coordination outreach:  10/17/23 at 10:30am  Andrea Dimes RN, BSN [place]  [facility] RN Care Manager [phone]

## 2023-08-17 NOTE — Progress Notes (Signed)
Cardiology Office Note:  .   Date:  08/19/2023  ID:  Ricky Boyle, DOB 1961-05-12, MRN [id number] PCP: [name], MD  [facility] Providers Cardiologist:  None {  History of Present Illness: Ricky Boyle is a 63 y.o. male with history of HTN, HLD, DM, cocaine abuse who presents for the evaluation of chest pain at the request of [name], MD.   History of Present Illness   Ricky Boyle, a 62 year old with a history of type 1 diabetes, hypertension, and past cocaine abuse, presents with intermittent chest pain for the past year. The pain, described as sharp, occurs once or twice a week and is sometimes associated with numbness in the arm and visible vein protrusion. The episodes are brief and can be triggered by stress or activity. Deep breathing seems to alleviate the symptoms. The patient brought these symptoms to his doctor's attention in April, but he may have been present for longer.  The patient's diabetes was previously uncontrolled with an A1c of 13.8, but with the use of a continuous monitor, his blood sugars have improved. He also has hypertension, managed with amlodipine and olmesartan. He has not had a heart attack or stroke, but has experienced severe headaches that made him feel like he was about to have a stroke. His blood pressure once spiked to 173/110. He was initially considered for dialysis at the time of his diabetes diagnosis, but his kidney function appears to have improved since then.  Ricky Boyle has been a smoker for 40 years, consuming about half a pack of cigarettes daily. He is currently trying to quit. He does not consume alcohol or use drugs currently. He is not currently working due to neuropathy in his feet and legs. He is widowed with three children and six grandchildren. His activity level varies depending on his blood sugar levels. He used to take a cholesterol medication but stopped due to adverse side effects.          Problem  List DM -A1c 13.8 HTN HLD -T chol 149, HDL 17, LDL 87, TG 224 4. Cocaine Abuse     ROS: All other ROS reviewed and negative. Pertinent positives noted in the HPI.     Studies Reviewed: Marland Kitchen   EKG Interpretation Date/Time:  Friday August 19 2023 09:37:37 EST Ventricular Rate:  97 PR Interval:  152 QRS Duration:  86 QT Interval:  338 QTC Calculation: 429 R Axis:   -39  Text Interpretation: Normal sinus rhythm Possible Left atrial enlargement Left axis deviation Minimal voltage criteria for LVH, may be normal variant ( Cornell product ) Septal infarct (cited on or before 02-Nov-2022) Confirmed by Ricky Boyle [phone]) on 08/19/2023 9:39:40 AM   Physical Exam:   VS:  BP (!) 138/90 (BP Location: Left Arm, Patient Position: Sitting, Cuff Size: Normal)   Pulse 97   Ht 5\' 6"  (1.676 m)   Wt 182 lb (82.6 kg)   BMI 29.38 kg/m    Wt Readings from Last 3 Encounters:  08/19/23 182 lb (82.6 kg)  03/29/23 184 lb 12.8 oz (83.8 kg)  02/23/23 188 lb 14.4 oz (85.7 kg)    GEN: Well nourished, well developed in no acute distress NECK: No JVD; No carotid bruits CARDIAC: RRR, no murmurs, rubs, gallops RESPIRATORY:  Clear to auscultation without rales, wheezing or rhonchi  ABDOMEN: Soft, non-tender, non-distended EXTREMITIES:  No edema; No deformity  ASSESSMENT AND PLAN: .   Assessment and Plan    Chest Pain  Intermittent sharp chest pain for the past year, triggered by stress and activity. No associated shortness of breath. EKG shows LVH. -Order coronary CTA and echocardiogram to evaluate heart function and rule out blockages. -Administer 100mg  of Metoprolol tartrate before the scan to slow heart rate.  Type 1 Diabetes Previously uncontrolled with an A1C of 13.8, now improved with continuous monitor. -Continue current management with Omnipod and G7.  Hyperlipidemia Diabetic, LDL should be less than 70. Previous CT chest showed no significant coronary calcium. -Start Lipitor 20mg   daily. -Check lipid panel today while fasting. -Schedule follow-up in 3 months with APP to re-evaluate lipids.  Hypertension Currently managed with Amlodipine and Olmesartan 10-40mg  daily. BP 138/90. -Continue current management and monitor.  Tobacco Use Current everyday smoker, at least half pack for 40 years. -Provided 3 minutes of smoking cessation counseling in office today. Encourage continued efforts to quit.              Follow-up: Return in about 3 months (around 11/17/2023).  Signed, Lenna Gilford. Flora Lipps, MD, [facility]  [facility]  [address] [place], [place] [address] [phone]  11:03 AM

## 2023-08-19 ENCOUNTER — Other Ambulatory Visit (HOSPITAL_COMMUNITY): Payer: Self-pay

## 2023-08-19 ENCOUNTER — Ambulatory Visit: Payer: Medicaid Other | Attending: Cardiovascular Disease | Admitting: Cardiovascular Disease

## 2023-08-19 ENCOUNTER — Encounter: Payer: Self-pay | Admitting: Cardiovascular Disease

## 2023-08-19 VITALS — BP 138/90 | HR 97 | Ht 66.0 in | Wt 182.0 lb

## 2023-08-19 DIAGNOSIS — Z01812 Encounter for preprocedural laboratory examination: Secondary | ICD-10-CM | POA: Diagnosis not present

## 2023-08-19 DIAGNOSIS — R079 Chest pain, unspecified: Secondary | ICD-10-CM | POA: Diagnosis not present

## 2023-08-19 DIAGNOSIS — F17210 Nicotine dependence, cigarettes, uncomplicated: Secondary | ICD-10-CM | POA: Diagnosis not present

## 2023-08-19 DIAGNOSIS — I150 Renovascular hypertension: Secondary | ICD-10-CM | POA: Diagnosis not present

## 2023-08-19 DIAGNOSIS — Z720 Tobacco use: Secondary | ICD-10-CM | POA: Diagnosis not present

## 2023-08-19 DIAGNOSIS — R072 Precordial pain: Secondary | ICD-10-CM | POA: Diagnosis not present

## 2023-08-19 DIAGNOSIS — E782 Mixed hyperlipidemia: Secondary | ICD-10-CM

## 2023-08-19 LAB — BASIC METABOLIC PANEL WITH GFR
BUN/Creatinine Ratio: 16 (ref 10–24)
BUN: 16 mg/dL (ref 8–27)
CO2: 23 mmol/L (ref 20–29)
Calcium: 10 mg/dL (ref 8.6–10.2)
Chloride: 104 mmol/L (ref 96–106)
Creatinine, Ser: 0.99 mg/dL (ref 0.76–1.27)
Glucose: 108 mg/dL — ABNORMAL HIGH (ref 70–99)
Potassium: 4.4 mmol/L (ref 3.5–5.2)
Sodium: 144 mmol/L (ref 134–144)
eGFR: 87 mL/min/1.73

## 2023-08-19 LAB — LIPID PANEL
Chol/HDL Ratio: 3.1 {ratio} (ref 0.0–5.0)
Cholesterol, Total: 176 mg/dL (ref 100–199)
HDL: 57 mg/dL (ref >39)
LDL Chol Calc (NIH): 106 mg/dL — ABNORMAL HIGH (ref 0–99)
Triglycerides: 69 mg/dL (ref 0–149)
VLDL Cholesterol Cal: 13 mg/dL (ref 5–40)

## 2023-08-19 MED ORDER — PREDNISONE 50 MG PO TABS
ORAL_TABLET | ORAL | 0 refills | Status: AC
  Filled 2023-08-19: qty 3, 2d supply, fill #0

## 2023-08-19 MED ORDER — ATORVASTATIN CALCIUM 20 MG PO TABS
20.0000 mg | ORAL_TABLET | Freq: Every day | ORAL | 3 refills | Status: AC
  Filled 2023-08-19: qty 90, 90d supply, fill #0
  Filled 2023-11-23: qty 90, 90d supply, fill #1
  Filled 2024-04-10: qty 90, 90d supply, fill #2
  Filled 2024-08-15: qty 90, 90d supply, fill #3

## 2023-08-19 MED ORDER — METOPROLOL TARTRATE 100 MG PO TABS
100.0000 mg | ORAL_TABLET | Freq: Once | ORAL | 0 refills | Status: DC
  Filled 2023-08-19: qty 1, 1d supply, fill #0

## 2023-08-19 NOTE — Addendum Note (Signed)
Addended by: Jonah Blue on: 08/19/2023 02:48 PM   Modules accepted: Orders

## 2023-08-19 NOTE — Patient Instructions (Addendum)
Medication Instructions:  START Lipitor 20 mg daily  Metoprolol tartrate (LOPRESSOR) 100 mg prior to CT Scan   *If you need a refill on your cardiac medications before your next appointment, please call your pharmacy*   Lab Work: BMET FASTING Lipid panel  If you have labs (blood work) drawn today and your tests are completely normal, you will receive your results only by: MyChart Message (if you have MyChart) OR A paper copy in the mail If you have any lab test that is abnormal or we need to change your treatment, we will call you to review the results.   Testing/Procedures: Echo will be scheduled at [address].  Your physician has requested that you have an echocardiogram. Echocardiography is a painless test that uses sound waves to create images of your heart. It provides your doctor with information about the size and shape of your heart and how well your heart's chambers and valves are working. This procedure takes approximately one hour. There are no restrictions for this procedure. Please do NOT wear cologne, perfume, aftershave, or lotions (deodorant is allowed). Please arrive 15 minutes prior to your appointment time.    Your cardiac CT will be scheduled at one of the below locations:   [facility] [address] [place], [place] [address] [phone]  OR  [facility] [address] [place], [place] [address] [phone]  OR   [facility] [address] [place], [place] [address] [phone]  If scheduled at [facility], please arrive at the [facility] and Children's Entrance (Entrance C2) of [facility] 30 minutes prior to test start time. You can use the FREE valet parking offered at entrance C (encouraged to control the heart rate for the test)  Proceed to the [facility] Radiology Department (first floor) to check-in and test prep.  All  radiology patients and guests should use entrance C2 at [facility], accessed from [facility], even though the hospital's physical address listed is [address]    If scheduled at [facility] or [facility], please arrive 15 mins early for check-in and test prep.  There is spacious parking and easy access to the radiology department from the [facility] Heart and Vascular entrance. Please enter here and check-in with the desk attendant.   Please follow these instructions carefully (unless otherwise directed):  An IV will be required for this test and Nitroglycerin will be given.  Hold all erectile dysfunction medications at least 3 days (72 hrs) prior to test. (Ie viagra, cialis, sildenafil, tadalafil, etc)   On the Night Before the Test: Be sure to Drink plenty of water. Do not consume any caffeinated/decaffeinated beverages or chocolate 12 hours prior to your test. Do not take any antihistamines 12 hours prior to your test. If the patient has contrast allergy: Patient will need a prescription for Prednisone and very clear instructions (as follows): Prednisone 50 mg - take 13 hours prior to test Take another Prednisone 50 mg 7 hours prior to test Take another Prednisone 50 mg 1 hour prior to test Take Benadryl 50 mg 1 hour prior to test Patient must complete all four doses of above prophylactic medications. Patient will need a ride after test due to Benadryl.  On the Day of the Test: Drink plenty of water until 1 hour prior to the test. Do not eat any food 1  hour prior to test. You may take your regular medications prior to the test.  Take metoprolol (Lopressor) two hours prior to test. If you take Furosemide/Hydrochlorothiazide/Spironolactone/Chlorthalidone, please HOLD on the morning of the test. Patients who wear a continuous glucose monitor MUST remove the device prior to scanning. FEMALES- please wear  underwire-free bra if available, avoid dresses & tight clothing      After the Test: Drink plenty of water. After receiving IV contrast, you may experience a mild flushed feeling. This is normal. On occasion, you may experience a mild rash up to 24 hours after the test. This is not dangerous. If this occurs, you can take Benadryl 25 mg and increase your fluid intake. If you experience trouble breathing, this can be serious. If it is severe call 911 IMMEDIATELY. If it is mild, please call our office.  We will call to schedule your test 2-4 weeks out understanding that some insurance companies will need an authorization prior to the service being performed.   For more information and frequently asked questions, please visit our website : [email or web address]  For non-scheduling related questions, please contact the cardiac imaging nurse navigator should you have any questions/concerns: Cardiac Imaging Nurse Navigators Direct Office Dial: [phone]   For scheduling needs, including cancellations and rescheduling, please call [place], [phone].    Follow-Up: At [facility], you and your health needs are our priority.  As part of our continuing mission to provide you with exceptional heart care, we have created designated Provider Care Teams.  These Care Teams include your primary Cardiologist (physician) and Advanced Practice Providers (APPs -  Physician Assistants and Nurse Practitioners) who all work together to provide you with the care you need, when you need it.   Your next appointment:   3 month(s)  Provider:   Theodore Demark, PA-C, Edd Fabian, FNP, Micah Flesher, PA-C, Marjie Skiff, PA-C, Robet Leu, PA-C, Juanda Crumble, PA-C, Joni Reining, DNP, ANP, Azalee Course, PA-C, Bernadene Person, NP, or Reather Littler, NP

## 2023-08-21 ENCOUNTER — Encounter: Payer: Self-pay | Admitting: Cardiovascular Disease

## 2023-08-24 ENCOUNTER — Ambulatory Visit (HOSPITAL_COMMUNITY): Payer: Medicaid Other

## 2023-08-25 ENCOUNTER — Other Ambulatory Visit (HOSPITAL_COMMUNITY): Payer: Self-pay

## 2023-08-25 ENCOUNTER — Ambulatory Visit: Payer: Medicaid Other | Admitting: Student

## 2023-08-25 ENCOUNTER — Ambulatory Visit: Payer: Self-pay | Admitting: Dietician

## 2023-08-25 VITALS — BP 111/70 | HR 107 | Temp 97.8°F | Ht 66.0 in | Wt 184.4 lb

## 2023-08-25 DIAGNOSIS — I10 Essential (primary) hypertension: Secondary | ICD-10-CM

## 2023-08-25 DIAGNOSIS — E1169 Type 2 diabetes mellitus with other specified complication: Secondary | ICD-10-CM

## 2023-08-25 DIAGNOSIS — K869 Disease of pancreas, unspecified: Secondary | ICD-10-CM

## 2023-08-25 DIAGNOSIS — Z9641 Presence of insulin pump (external) (internal): Secondary | ICD-10-CM

## 2023-08-25 DIAGNOSIS — E0842 Diabetes mellitus due to underlying condition with diabetic polyneuropathy: Secondary | ICD-10-CM

## 2023-08-25 DIAGNOSIS — G4733 Obstructive sleep apnea (adult) (pediatric): Secondary | ICD-10-CM | POA: Diagnosis not present

## 2023-08-25 LAB — GLUCOSE, CAPILLARY: Glucose-Capillary: 129 mg/dL — ABNORMAL HIGH (ref 70–99)

## 2023-08-25 LAB — POCT GLYCOSYLATED HEMOGLOBIN (HGB A1C): Hemoglobin A1C: 6.1 % — AB (ref 4.0–5.6)

## 2023-08-25 MED ORDER — DULOXETINE HCL 20 MG PO CPEP
40.0000 mg | ORAL_CAPSULE | Freq: Every day | ORAL | 1 refills | Status: AC
  Filled 2023-08-25 – 2023-09-22 (×3): qty 60, 30d supply, fill #0
  Filled 2023-10-23: qty 60, 30d supply, fill #1

## 2023-08-25 NOTE — Progress Notes (Signed)
Established Patient Office Visit  Subjective   Patient ID: Ricky Boyle, male    DOB: Feb 27, 1961  Age: 63 y.o. MRN: [id number]  Chief Complaint  Patient presents with   Medical Management of Chronic Issues   Foot Pain    Bilateral     [name] is a 63 y.o. who presents to the clinic for a follow up of HTN and T2DM. Please see problem based assessment and plan for additional details.  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) 03/29/2023   Pain due to onychomycosis of toenails of both feet 02/11/2023   Sesamoiditis 02/11/2023   Capsulitis 02/11/2023   Chest pressure 11/02/2022   Rash and nonspecific skin eruption 10/18/2022   Weight loss 10/15/2022   Fecal incontinence 10/15/2022   Polysubstance use disorder 09/16/2022   Housing instability, currently housed, at risk for homelessness 09/16/2022   Tobacco use disorder 03/30/2021   Healthcare maintenance 03/30/2021   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) 05/09/2020   Essential hypertension 04/20/2020   Pancreatitis 04/17/2020   OSA (obstructive sleep apnea) 06/16/2016      Objective:     BP 111/70 (BP Location: Right Arm, Patient Position: Sitting, Cuff Size: Normal)   Pulse (!) 107   Temp 97.8 F (36.6 C) (Oral)   Ht 5\' 6"  (1.676 m)   Wt 184 lb 6.4 oz (83.6 kg)   SpO2 100%   BMI 29.76 kg/m  BP Readings from Last 3 Encounters:  08/25/23 111/70  08/19/23 (!) 138/90  03/29/23 (!) 156/117   Wt Readings from Last 3 Encounters:  08/25/23 184 lb 6.4 oz (83.6 kg)  08/19/23 182 lb (82.6 kg)  03/29/23 184 lb 12.8 oz (83.8 kg)      Physical Exam Vitals reviewed.  Constitutional:      General: He is not in acute distress.    Appearance: He is not ill-appearing, toxic-appearing or diaphoretic.  Cardiovascular:     Rate and Rhythm: Normal  rate and regular rhythm.     Heart sounds: Normal heart sounds. No murmur heard. Pulmonary:     Effort: Pulmonary effort is normal. No respiratory distress.     Breath sounds: Normal breath sounds. No stridor. No wheezing or rhonchi.  Skin:    General: Skin is warm and dry.  Neurological:     Mental Status: He is alert.  Psychiatric:        Mood and Affect: Mood normal.        Behavior: Behavior normal. Behavior is cooperative.      Results for orders placed or performed in visit on 08/25/23  Glucose, capillary  Result Value Ref Range   Glucose-Capillary 129 (H) 70 - 99 mg/dL  Results for orders placed or performed in visit on 08/25/23  POC Hbg A1C  Result Value Ref Range   Hemoglobin A1C 6.1 (A) 4.0 - 5.6 %   HbA1c POC (<> result, manual entry)     HbA1c, POC (prediabetic range)     HbA1c, POC (controlled diabetic range)      Last hemoglobin A1c Lab Results  Component Value Date   HGBA1C 6.1 (A) 08/25/2023      The 10-year ASCVD risk score (Arnett DK, et al., 2019) is: 29.4%    Assessment & Plan:   Problem List Items Addressed This Visit       Cardiovascular  and Mediastinum   Essential hypertension (Chronic)   His hypertension is well-controlled today with a blood pressure of 111/70.  He is on a current regimen of amlodipine-olmesartan 10-40 mg.  He denies any symptoms of hypotension.  He had a BMP in 1 week ago with a serum creatinine of 0.99. Plan: -Will continue current medication regimen of amlodipine-olmesartan 10/40 mg        Respiratory   OSA (obstructive sleep apnea)   Patient has a history of OSA and does not use CPAP at home.  I am unable to find a report of a prior sleep study.  Per patient, he had a polysomnography years ago.  He stated that he snores nightly and that he will wake up gasping for air every couple hours. 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  His A1c was 13.8 in June 2024.  Today's A1c has improved to 6.1 while using the Omnipod.  It appears that the patient's average insulin usage per day 6.3 units of insulin per day.  Per CGM there was one low at 58. 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  Patient has tried Lyrica in the past which was helpful; however, he reported side effects and did not want to retry this medication.  He stated that he has looked into gabapentin and he does not want to try gabapentin because he believes that the side effects outweigh the benefits.  Patient does report bilateral neuropathy symptoms of pins-and-needles in his feet that I worsened within the past year.  He is on a current regimen of Cymbalta 20 mg and does not believe that this is helping his diabetic neuropathy.  He was on 30 mg a few months ago and asked to go down on the regimen due to feeling "spacey" while taking Lyrica. Plan: -Increase to 40 mg every night, patient is agreeable to plan  -Will refer to podiatry(patient saw podiatrist in past and would like to see a different one)         Return in about 3 months (around 11/23/2023) for DM.    [name], DO

## 2023-08-25 NOTE — Assessment & Plan Note (Signed)
The patient presents to the clinic for a follow-up of his previously poorly controlled diabetes.  His A1c was 13.8 in June 2024.  Today's A1c has improved to 6.1 while using the Omnipod.  It appears that the patient's average insulin usage per day 6.3 units of insulin per day.  Per CGM there was one low at 58. Plan: -Will continue Omnipod insulin pump -Follow-up in 3 months of patient's well-controlled diabetes

## 2023-08-25 NOTE — Assessment & Plan Note (Signed)
Patient has a 1 year history of diabetic neuropathy.  Patient has tried Lyrica in the past which was helpful; however, he reported side effects and did not want to retry this medication.  He stated that he has looked into gabapentin and he does not want to try gabapentin because he believes that the side effects outweigh the benefits.  Patient does report bilateral neuropathy symptoms of pins-and-needles in his feet that I worsened within the past year.  He is on a current regimen of Cymbalta 20 mg and does not believe that this is helping his diabetic neuropathy.  He was on 30 mg a few months ago and asked to go down on the regimen due to feeling "spacey" while taking Lyrica. Plan: -Increase to 40 mg every night, patient is agreeable to plan  -Will refer to podiatry(patient saw podiatrist in past and would like to see a different one)

## 2023-08-25 NOTE — Patient Instructions (Addendum)
Call for diabetes Eye exam: [facility] [address] [place], [place] [address] [phone]  Vitamin C food sources - Oranges , Kiwi, tomatoes  Folic Acid - Cereals   If you take a one a day multivitamin  will give you  vitamin C , folic acid, and B12 all in one.   Ricky Boyle [phone]

## 2023-08-25 NOTE — Assessment & Plan Note (Signed)
His hypertension is well-controlled today with a blood pressure of 111/70.  He is on a current regimen of amlodipine-olmesartan 10-40 mg.  He denies any symptoms of hypotension.  He had a BMP in 1 week ago with a serum creatinine of 0.99. Plan: -Will continue current medication regimen of amlodipine-olmesartan 10/40 mg

## 2023-08-25 NOTE — Assessment & Plan Note (Signed)
Patient has a history of OSA and does not use CPAP at home.  I am unable to find a report of a prior sleep study.  Per patient, he had a polysomnography years ago.  He stated that he snores nightly and that he will wake up gasping for air every couple hours. Plan: -Polysomnography ordered

## 2023-08-25 NOTE — Patient Instructions (Addendum)
Thank you, Ricky Boyle for allowing [place] to provide your care today. Today we discussed blood pressure, diabetes, and neuropathy.    I have ordered the following labs for you:   Lab Orders         POC Hbg A1C       Referrals ordered today:   Referral Orders  No referral(s) requested today     I have ordered the following medication/changed the following medications:   Stop the following medications: There are no discontinued medications.   Start the following medications: Meds ordered this encounter  Medications   DULoxetine (CYMBALTA) 20 MG capsule    Sig: Take 2 capsules (40 mg total) by mouth daily.    Dispense:  60 capsule    Refill:  1     Follow up: 3 months    Should you have any questions or concerns please call the internal medicine clinic at [phone].     Please note that our late policy has changed.  If you are more than 15 minutes late to your appointment, you may be asked to reschedule your appointment.  Dr. Hessie Diener, D.O. [facility]

## 2023-08-25 NOTE — Progress Notes (Addendum)
Lab Results  Component Value Date   HGBA1C 6.1 (A) 08/25/2023   HGBA1C 13.8 (A) 01/24/2023   HGBA1C >14.0 (A) 09/15/2022   HGBA1C 11.8 (H) 06/21/2021   HGBA1C 5.5 03/14/2021     BP Readings from Last 3 Encounters:  08/25/23 111/70  08/19/23 (!) 138/90  03/29/23 (!) 156/117   Estimated body mass index is 29.76 kg/m as calculated from the following:   Height as of an earlier encounter on 08/25/23: 5\' 6"  (1.676 m).   Weight as of an earlier encounter on 08/25/23: 184 lb 6.4 oz (83.6 kg). Wt Readings from Last 10 Encounters:  08/25/23 184 lb 6.4 oz (83.6 kg)  08/19/23 182 lb (82.6 kg)  03/29/23 184 lb 12.8 oz (83.8 kg)  02/23/23 188 lb 14.4 oz (85.7 kg)  01/24/23 182 lb 8 oz (82.8 kg)  11/02/22 175 lb 8 oz (79.6 kg)  10/15/22 170 lb 8 oz (77.3 kg)  09/15/22 169 lb 12.8 oz (77 kg)  09/13/22 177 lb 14.6 oz (80.7 kg)  07/22/22 178 lb (80.7 kg)   Diabetes Self-Management Education  Visit Type: Follow-up (4th visit after insulin pump initiation, 6th visit in this series)  Appt. Start Time: 1330 Appt. End Time: 1400  08/25/2023  [name], identified by name and date of birth, is a 63 y.o. male with a diagnosis of Diabetes:  .   ASSESSMENT Podiatry referral - legs better, feet still bother him . Doctor needs to put in another referral. His feet are high risk due to smoking and neuropathy Suggest checking B12, vitamin D,  add vitamin C supplement recommend due to smoker Insulin pump start was 03/2024, has has two scheduled follow ups since then. Today was his third visit since pump start, A1c vastly improved. Weight stable and 10# decrease may assist with better health outcomes.  He stopped entering his carbs and if he takes insulin for meals it is a minimal correction dose. ZHe was encouraged to take insulin prior to meals to prevent blood sugar spikes > 200 mg/dl including his coffee that he said he knows spikes his glucose.   [name] wore the CGM for 14 days. The  average reading was 146, % time in target was 85, % time below target was 0, and % time above target was. 15.     Diabetes Self-Management Education - 08/25/23 1400       Visit Information   Visit Type Follow-up   4th visit after insulin pump initiation, 6th visit in this series     Health Coping   How would you rate your overall health? Good      Pre-Education Assessment   Patient understands prevention, detection, and treatment of chronic complications. Needs Review      Complications   Last HgB A1C per patient/outside source 6.1 %    How often do you check your blood sugar? > 4 times/day    Fasting Blood glucose range (mg/dL) 32-440;102-725    Postprandial Blood glucose range (mg/dL) 36-644;034-742;595-638    Number of hypoglycemic episodes per month 0    Number of hyperglycemic episodes ( >200mg /dL): Weekly    Can you tell when your blood sugar is high? Yes    What do you do if your blood sugar is high? takes correction above 250    Have you had a dilated eye exam in the past 12 months? No    Have you had a dental exam in the past 12 months? No  Are you checking your feet? Yes    How many days per week are you checking your feet? 7      Dietary Intake   Breakfast not assessed today      Activity / Exercise   Activity / Exercise Type ADL's;Light (walking / raking leaves)    How many days per week do you exercise? 5    How many minutes per day do you exercise? 30    Total minutes per week of exercise 150      Patient Education   Previous Diabetes Education Yes (please comment)   here   Chronic complications Retinopathy and reason for yearly dilated eye exams;Assessed and discussed foot care and prevention of foot problems      Individualized Goals (developed by patient)   Medications take my medication as prescribed      Patient Self-Evaluation of Goals - Patient rates self as meeting previously set goals (% of time)   Medications >75% (most of the time)     Monitoring >75% (most of the time)      Post-Education Assessment   Patient understands incorporating nutritional management into lifestyle. Comprehends key points    Patient understands using medications safely. Comphrehends key points    Patient understands monitoring blood glucose, interpreting and using results Comprehends key points    Patient understands prevention, detection, and treatment of acute complications. Comprehends key points    Patient understands prevention, detection, and treatment of chronic complications. Demonstrates understanding / competency      Outcomes   Expected Outcomes Demonstrated interest in learning. Expect positive outcomes    Future DMSE 6 months    Program Status Completed      Subsequent Visit   Since your last visit have you continued or begun to take your medications as prescribed? Yes    Since your last visit have you had your blood pressure checked? Yes    Is your most recent blood pressure lower, unchanged, or higher since your last visit? Lower    Since your last visit have you experienced any weight changes? No change    Since your last visit, are you checking your blood glucose at least once a day? Yes             Individualized Plan for Diabetes Self-Management Training:   Learning Objective:  Patient will have a greater understanding of diabetes self-management. Patient education plan is to attend individual and/or group sessions per assessed needs and concerns.   Plan:   Patient Instructions  Call for diabetes Eye exam: [facility] [address] [place], [place] [address] [phone]  Vitamin C food sources - Oranges , Kiwi, tomatoes  Folic Acid - Cereals   If you take a one a day multivitamin  will give you  vitamin C , folic acid, and B12 all in one.   Lupita Leash [phone]    Expected Outcomes:  Demonstrated interest in learning. Expect positive outcomes  Education  material provided: Diabetes Resources  If problems or questions, patient to contact team via:  Phone  Future DSME appointment: 6 months Norm Parcel, RD 08/25/2023 2:43 PM.

## 2023-08-26 NOTE — Progress Notes (Signed)
Internal Medicine Clinic Attending  Case discussed with the resident at the time of the visit.  We reviewed the resident's history and exam and pertinent patient test results.  I agree with the assessment, diagnosis, and plan of care documented in the resident's note.

## 2023-08-30 ENCOUNTER — Other Ambulatory Visit: Payer: Self-pay

## 2023-08-30 ENCOUNTER — Telehealth (HOSPITAL_COMMUNITY): Payer: Self-pay | Admitting: *Deleted

## 2023-08-30 ENCOUNTER — Other Ambulatory Visit: Payer: Self-pay | Admitting: Student

## 2023-08-30 MED ORDER — DEXCOM G7 SENSOR MISC
1.0000 | Freq: Every day | 1 refills | Status: DC
  Filled 2023-08-30: qty 9, 90d supply, fill #0
  Filled 2023-11-23: qty 9, 90d supply, fill #1

## 2023-08-30 NOTE — Telephone Encounter (Signed)
Reaching out to patient to offer assistance regarding upcoming cardiac imaging study; pt verbalizes understanding of appt date/time, parking situation and where to check in, pre-test NPO status and medications ordered, and verified current allergies; name and call back number provided for further questions should they arise Johney Frame RN Navigator Cardiac Imaging [facility] [phone] office [phone] cell  Patient denies contrast allergy and aware prep not needed for shellfish allergy.  Patient verbalized understanding.  Patient aware to take 100mg  metoprolol tartrate at 1030AM and arrive at 12pm.

## 2023-08-31 ENCOUNTER — Ambulatory Visit (HOSPITAL_COMMUNITY)
Admission: RE | Admit: 2023-08-31 | Discharge: 2023-08-31 | Disposition: A | Discharge status: Home / Self Care | Payer: Medicaid Other | Source: Ambulatory Visit | Attending: Cardiovascular Disease | Admitting: Cardiovascular Disease

## 2023-08-31 DIAGNOSIS — R079 Chest pain, unspecified: Secondary | ICD-10-CM

## 2023-08-31 MED ORDER — IOHEXOL 350 MG/ML SOLN
100.0000 mL | Freq: Once | INTRAVENOUS | Status: AC | PRN
  Administered 2023-08-31: 100 mL via INTRAVENOUS

## 2023-08-31 MED ORDER — NITROGLYCERIN 0.4 MG SL SUBL
0.8000 mg | SUBLINGUAL_TABLET | Freq: Once | SUBLINGUAL | Status: AC
  Administered 2023-08-31: 0.8 mg via SUBLINGUAL

## 2023-08-31 MED ORDER — NITROGLYCERIN 0.4 MG SL SUBL
SUBLINGUAL_TABLET | SUBLINGUAL | Status: AC
  Filled 2023-08-31: qty 2

## 2023-09-01 ENCOUNTER — Encounter: Payer: Self-pay | Admitting: Cardiovascular Disease

## 2023-09-03 DIAGNOSIS — Z419 Encounter for procedure for purposes other than remedying health state, unspecified: Secondary | ICD-10-CM | POA: Diagnosis not present

## 2023-09-05 ENCOUNTER — Other Ambulatory Visit: Payer: Self-pay | Admitting: Student

## 2023-09-05 DIAGNOSIS — G4733 Obstructive sleep apnea (adult) (pediatric): Secondary | ICD-10-CM

## 2023-09-06 ENCOUNTER — Other Ambulatory Visit (HOSPITAL_COMMUNITY): Payer: Self-pay

## 2023-09-08 ENCOUNTER — Encounter: Payer: Self-pay | Admitting: Dietician

## 2023-09-22 ENCOUNTER — Ambulatory Visit (HOSPITAL_COMMUNITY): Admission: RE | Admit: 2023-09-22 | Payer: Medicaid Other | Source: Ambulatory Visit

## 2023-09-22 ENCOUNTER — Other Ambulatory Visit: Payer: Self-pay | Admitting: Cardiovascular Disease

## 2023-09-22 ENCOUNTER — Other Ambulatory Visit (HOSPITAL_COMMUNITY): Payer: Self-pay

## 2023-09-22 ENCOUNTER — Encounter (HOSPITAL_COMMUNITY): Payer: Self-pay | Admitting: Cardiovascular Disease

## 2023-09-22 ENCOUNTER — Other Ambulatory Visit: Payer: Self-pay

## 2023-10-01 DIAGNOSIS — Z419 Encounter for procedure for purposes other than remedying health state, unspecified: Secondary | ICD-10-CM | POA: Diagnosis not present

## 2023-10-03 ENCOUNTER — Other Ambulatory Visit: Payer: Self-pay | Admitting: Cardiovascular Disease

## 2023-10-03 ENCOUNTER — Other Ambulatory Visit: Payer: Self-pay

## 2023-10-10 ENCOUNTER — Encounter: Payer: Self-pay | Admitting: Neurology

## 2023-10-10 ENCOUNTER — Institutional Professional Consult (permissible substitution): Payer: Medicaid Other | Admitting: Neurology

## 2023-10-12 ENCOUNTER — Other Ambulatory Visit (HOSPITAL_COMMUNITY): Payer: Self-pay

## 2023-10-17 ENCOUNTER — Other Ambulatory Visit: Payer: Self-pay | Admitting: *Deleted

## 2023-10-17 NOTE — Patient Outreach (Signed)
  Medicaid Managed Care   Unsuccessful Attempt Note   10/17/2023 Name: Ricky Boyle MRN: [id number] DOB: 09/04/1960  Referred by: [name], MD Reason for referral : High Risk Managed Medicaid (Unsuccessful RNCM follow up telephone outreach)   An unsuccessful telephone outreach was attempted today. The patient was referred to the case management team for assistance with care management and care coordination.    Follow Up Plan: A HIPAA compliant phone message was left for the patient providing contact information and requesting a return call. and The Managed Medicaid care management team will reach out to the patient again over the next 7 days.    [name] RN, BSN [place]  [facility] RN Care Manager [phone]

## 2023-10-17 NOTE — Patient Instructions (Signed)
 Visit Information  [name]  - as a part of your Medicaid benefit, you are eligible for care management and care coordination services at no cost or copay. I was unable to reach you by phone today but would be happy to help you with your health related needs. Please feel free to call me @ [phone].  A member of the Managed Medicaid care management team will reach out to you again over the next 7 days.   [name] RN, BSN [place]  [facility] RN Care Manager [phone]

## 2023-10-18 ENCOUNTER — Telehealth: Payer: Self-pay

## 2023-10-18 NOTE — Progress Notes (Signed)
 Complex Care Management Note  Care Guide Note 10/18/2023 Name: Ricky Boyle MRN: [id number] DOB: [birth year]  Ayomikun Starling is a 63 y.o. year old male who sees [name], MD for primary care. I reached out to [name] by phone today to offer complex care management services.  Mr. Shenk was given information about Complex Care Management services today including:   The Complex Care Management services include support from the care team which includes your Nurse Care Manager, Clinical Social Worker, or Pharmacist.  The Complex Care Management team is here to help remove barriers to the health concerns and goals most important to you. Complex Care Management services are voluntary, and the patient may decline or stop services at any time by request to their care team member.   Complex Care Management Consent Status: Patient wishes to consider information provided and/or speak with a member of the care team before deciding to participate in complex care management services.   Follow up plan:  Telephone appointment with complex care management team member scheduled for:  10/25/23 at 9:30 a.m.   Encounter Outcome:  Patient Scheduled  [facility]  [facility] Care Management Assistant Direct Dial: [phone]  Fax: [phone]

## 2023-10-19 ENCOUNTER — Telehealth: Payer: Self-pay | Admitting: *Deleted

## 2023-10-19 NOTE — Telephone Encounter (Signed)
 Will forward to PCP

## 2023-10-19 NOTE — Telephone Encounter (Signed)
 Copied from CRM [id number]. Topic: Clinical - Medical Advice >> Oct 19, 2023  9:52 AM Maxwell Marion wrote: Reason for CRM: Patient would like to let the doctor know that when he was using his omni pods, blood sugar was running between 50-80. He said one time, he ate a whole thing of tootsie rolls and blood sugar was still running low. He said he has since then took the omni pods off and his blood sugar has been running no higher than 120. He asked if provider can get a look at readings from Lupita Leash and see what he is talking about. Says he hasn't had insulin in 2 weeks.

## 2023-10-20 NOTE — Telephone Encounter (Signed)
 Called and spoke with pt, scheduled an appt on 3/26 @ 1:45 with Dr. Thomasene Ripple. Also scheduled an appt for him to see Lupita Leash 3/26 @ 2:15.

## 2023-10-23 ENCOUNTER — Other Ambulatory Visit: Payer: Self-pay | Admitting: Cardiovascular Disease

## 2023-10-24 ENCOUNTER — Other Ambulatory Visit: Payer: Self-pay

## 2023-10-25 ENCOUNTER — Other Ambulatory Visit: Payer: Self-pay | Admitting: Student

## 2023-10-25 ENCOUNTER — Other Ambulatory Visit: Payer: Self-pay

## 2023-10-25 ENCOUNTER — Ambulatory Visit: Payer: Self-pay

## 2023-10-25 ENCOUNTER — Other Ambulatory Visit (HOSPITAL_COMMUNITY): Payer: Self-pay

## 2023-10-25 DIAGNOSIS — E1169 Type 2 diabetes mellitus with other specified complication: Secondary | ICD-10-CM

## 2023-10-25 NOTE — Patient Outreach (Signed)
 Care Coordination   Follow Up Visit Note   10/25/2023 Name: Ricky Boyle MRN: [id number] DOB: 1960/11/02  Ricky Boyle is a 63 y.o. year old male who sees [name], MD for primary care. I spoke with  [name] by phone today.  What matters to the patients health and wellness today?             [name] is a transfer patient diagnosed with type 1 diabetes, with a most recent A1C level of 6.1. He resides with his daughter. During his last interaction, his blood pressure was recorded at 111/70; however, he reported a recent elevation in his readings due to a lapse in medication. I emphasized the necessity of regularly monitoring his blood pressure, and he is expected to receive his medication today. I also advised him to call for refills 7 days in advance.  We reviewed his dietary habits, noted his allergies, and examined his blood work. He has a scheduled appointment with the internal medicine department on March 26 to consult withhis physician and Juluis Mire. Additionally, he was unable to attend a scheduled sleep study on March 10 and intends to reschedule this appointment to discuss his obstructive sleep apnea, expressing particular interest in the [facility] implant.  Furthermore, Ricky Boyle reported significant pain in his feet and is uncertain if he still presents with neuropathy, as the discomfort in his legs has diminished following improvements in his diabetes management. I recommended that he contact his insurance provider to obtain a list of local podiatrists who accept his insurance and suggested he pursue a referral for a consultation with another podiatrist.    Goals Addressed             This Visit's Progress    patient stated - He needs to monitor his HTN and pain in his feet       Objective:  Last practice recorded BP readings:  BP Readings from Last 3 Encounters:  08/31/23 130/80  08/25/23 111/70  08/19/23 (!) 138/90   Most recent eGFR/CrCl:  Lab  Results  Component Value Date   EGFR 87 08/19/2023    No components found for: "CRCL" Current Barriers:  Difficulty obtaining medications  Case Manager Clinical Goal(s): patient will attend all scheduled medical appointments patient will demonstrate improved adherence to prescribed treatment plan for hypertension as evidenced by taking all medications as prescribed, monitoring and recording blood pressure as directed, adhering to low sodium/DASH diet  Interventions:   Evaluation of current treatment plan related to hypertension self management and patient's adherence to plan as established by provider. Provided education to patient re: stroke prevention, s/s of heart attack and stroke, DASH diet, complications of uncontrolled blood pressure Reviewed medications with patient and discussed importance of compliance Discussed plans with patient for ongoing care management follow up and provided patient with direct contact information for care management team Advised patient, providing education and rationale, to monitor blood pressure daily and record, calling PCP for findings outside established parameters.  Reviewed scheduled/upcoming provider appointments including:  Self-Care Activities: - Self administers medications as prescribed Attends all scheduled provider appointments Calls provider office for new concerns, questions, or BP outside discussed parameters Checks BP and records as discussed Patient Goals: - check blood pressure 3 times per week - write blood pressure results in a log or diary   Current Barriers:  Knowledge Deficits related to self-health management of acute or chronic pain Chronic Disease Management support and education needs related to chronic pain  Clinical Goal(s):  Over the next 90 days, patient will verbalize understanding of plan for managing pain patient will demonstrate use of different relaxation  skills and/or diversional activities to assist with  pain reduction (distraction, imagery, relaxation, massage, acupressure, TENS, heat, and cold application., patient will report pain at a level less than 3 to 4 on a 10-10 rating scale., and patient will engage in desired activities without an increase in pain level  Interventions:  Collaboration with [name], MD regarding development and update of comprehensive plan of care as evidenced by provider attestation and co-signature Discussed plans with patient for ongoing care management follow up and provided patient with direct contact information for care management team  Patient Goals/Self Care Activities:  Will self-administer medications as prescribed Will attend all scheduled provider appointments Will call pharmacy for medication refills 7 days prior to needed refill date Patient will calls provider office for new concerns or questions             SDOH assessments and interventions completed:  Yes  SDOH Interventions Today    Flowsheet Row Most Recent Value  SDOH Interventions   Food Insecurity Interventions Intervention Not Indicated  Housing Interventions Intervention Not Indicated  Transportation Interventions Intervention Not Indicated  Utilities Interventions Intervention Not Indicated        Care Coordination Interventions:  Yes, provided   Interventions Today    Flowsheet Row Most Recent Value  Chronic Disease   Chronic disease during today's visit Hypertension (HTN), Other  [Pain]  General Interventions   General Interventions Discussed/Reviewed General Interventions Discussed, General Interventions Reviewed  Education Interventions   Education Provided Provided Education  Provided Verbal Education On Other  [Insurance]  Nutrition Interventions   Nutrition Discussed/Reviewed Nutrition Discussed, Decreasing salt  Pharmacy Interventions   Pharmacy Dicussed/Reviewed Pharmacy Topics Discussed, Pharmacy Topics Reviewed  Safety Interventions   Safety  Discussed/Reviewed Safety Discussed        Follow up plan: Follow up call scheduled for 11/08/23  3 pm    Encounter Outcome:  Patient Visit Completed   Juanell Fairly RN, BSN, [facility] [place]  [facility], [facility]  Care Coordinator Phone: [phone]

## 2023-10-25 NOTE — Patient Instructions (Signed)
 Visit Information  Thank you for taking time to visit with me today. Please don't hesitate to contact me if I can be of assistance to you.   Following are the goals we discussed today:   Goals Addressed             This Visit's Progress    patient stated - He needs to monitor his HTN and pain in his feet       Objective:  Last practice recorded BP readings:  BP Readings from Last 3 Encounters:  08/31/23 130/80  08/25/23 111/70  08/19/23 (!) 138/90   Most recent eGFR/CrCl:  Lab Results  Component Value Date   EGFR 87 08/19/2023    No components found for: "CRCL" Current Barriers:  Difficulty obtaining medications  Case Manager Clinical Goal(s): patient will attend all scheduled medical appointments patient will demonstrate improved adherence to prescribed treatment plan for hypertension as evidenced by taking all medications as prescribed, monitoring and recording blood pressure as directed, adhering to low sodium/DASH diet  Interventions:   Evaluation of current treatment plan related to hypertension self management and patient's adherence to plan as established by provider. Provided education to patient re: stroke prevention, s/s of heart attack and stroke, DASH diet, complications of uncontrolled blood pressure Reviewed medications with patient and discussed importance of compliance Discussed plans with patient for ongoing care management follow up and provided patient with direct contact information for care management team Advised patient, providing education and rationale, to monitor blood pressure daily and record, calling PCP for findings outside established parameters.  Reviewed scheduled/upcoming provider appointments including:  Self-Care Activities: - Self administers medications as prescribed Attends all scheduled provider appointments Calls provider office for new concerns, questions, or BP outside discussed parameters Checks BP and records as  discussed Patient Goals: - check blood pressure 3 times per week - write blood pressure results in a log or diary   Current Barriers:  Knowledge Deficits related to self-health management of acute or chronic pain Chronic Disease Management support and education needs related to chronic pain    Clinical Goal(s):  Over the next 90 days, patient will verbalize understanding of plan for managing pain patient will demonstrate use of different relaxation  skills and/or diversional activities to assist with pain reduction (distraction, imagery, relaxation, massage, acupressure, TENS, heat, and cold application., patient will report pain at a level less than 3 to 4 on a 10-10 rating scale., and patient will engage in desired activities without an increase in pain level  Interventions:  Collaboration with [name], MD regarding development and update of comprehensive plan of care as evidenced by provider attestation and co-signature Discussed plans with patient for ongoing care management follow up and provided patient with direct contact information for care management team  Patient Goals/Self Care Activities:  Will self-administer medications as prescribed Will attend all scheduled provider appointments Will call pharmacy for medication refills 7 days prior to needed refill date Patient will calls provider office for new concerns or questions             Our next appointment is by telephone on 11/08/23 at 3 pm  Please call the care guide team at [phone] if you need to cancel or reschedule your appointment.   If you are experiencing a Mental Health or Behavioral Health Crisis or need someone to talk to, please call 1-800-273-TALK (toll free, 24 hour hotline)  Patient verbalizes understanding of instructions and care plan provided today and agrees to view  in MyChart. Active MyChart status and patient understanding of how to access instructions and care plan via MyChart  confirmed with patient.     Juanell Fairly RN, BSN, [facility]  [facility], [facility]  Care Coordinator Phone: [phone]

## 2023-10-25 NOTE — Telephone Encounter (Signed)
 Medication last refilled on 08/08/23 with 3 refills.

## 2023-10-26 ENCOUNTER — Encounter: Payer: Self-pay | Admitting: Dietician

## 2023-10-26 ENCOUNTER — Encounter: Payer: Self-pay | Admitting: Student

## 2023-11-01 ENCOUNTER — Ambulatory Visit: Payer: Self-pay | Admitting: Student

## 2023-11-05 ENCOUNTER — Other Ambulatory Visit (HOSPITAL_COMMUNITY): Payer: Self-pay

## 2023-11-08 ENCOUNTER — Ambulatory Visit: Payer: Self-pay

## 2023-11-08 NOTE — Patient Instructions (Signed)
 Visit Information  Thank you for taking time to visit with me today. Please don't hesitate to contact me if I can be of assistance to you before our next scheduled appointment.  Your next care management appointment is by telephone on 12/09/23 at 915 am  Telephone follow-up in 1 month  Please call the care guide team at [phone] if you need to cancel, schedule, or reschedule an appointment.   Please call 1-800-273-TALK (toll free, 24 hour hotline) if you are experiencing a Mental Health or Behavioral Health Crisis or need someone to talk to.  Juanell Fairly RN, BSN, [facility]  [facility], [facility]  Care Coordinator Phone: [phone]

## 2023-11-08 NOTE — Patient Outreach (Signed)
 Complex Care Management   Visit Note  11/08/2023  Name:  Ricky Boyle MRN: [id number] DOB: 04/01/1961  Situation: Referral received for Complex Care Management related to  Diabetes Type 1  I obtained verbal consent from Patient.  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 His average reading is 106, with today's reading at 122. He mentioned experiencing a couple of lows, one of which dropped to 64. I advised him to consult with his primary care physician and to use his glucometer to cross-check the readings from his Dexcom G7 to ensure it is functioning properly. He stated that he did not experience any symptoms during the low readings, and he is familiar with how lows feel since he has had them before.    Patient Reported Symptoms: Cognitive Able to follow simple commands, Alert and oriented to person, place, and time  Neurological No symptoms reported    HEENT No symptoms reported    Cardiovascular No symptoms reported    Respiratory No symptoms reported    Endocrine No symptoms reported    Gastrointestinal No symptoms reported    Genitourinary No symptoms reported    Integumentary No symptoms reported    Musculoskeletal No symptoms reported    Psychosocial Not assessed     There were no vitals filed for this visit.  Medications Reviewed Today     Reviewed by Juanell Fairly, RN (Registered Nurse) on  11/08/23 at 1519  Med List Status: <None>   Medication Order Taking? Sig Documenting Provider Last Dose Status Informant  amLODipine-olmesartan (AZOR) 10-40 MG tablet [id number] Yes Take 1 tablet by mouth daily. [name], DO Taking Active   atorvastatin (LIPITOR) 20 MG tablet [id number]  Take 1 tablet (20 mg total) by mouth daily.  Patient not taking: Reported on 10/25/2023   Sande Rives, MD  Active   Blood Glucose Monitoring Suppl [facility] VERIO) w/Device KIT [id number]  Use to check blood sugar. Belva Agee, MD  Active   buPROPion (WELLBUTRIN XL) 150 MG 24 hr tablet [id number]  Take 1 tablet (150 mg total) by mouth daily.  Patient not taking: Reported on 10/25/2023   Rana Snare, DO  Active   cetaphil (CETAPHIL) lotion [id number]  Apply 1 Application topically as needed for dry skin.  Patient not taking: Reported on 10/25/2023   Belva Agee, MD  Active   Continuous Glucose Receiver Vira Agar G6 RECEIVER) DEVI [id number]  Use to monitor your blood glucose continuously  Patient not taking: Reported on 10/25/2023   Earl Lagos, MD  Active   Continuous Glucose Sensor (DEXCOM G7 SENSOR) MISC [id number]  Change every 10 days [name], MD  Active   Continuous Glucose Transmitter (DEXCOM G6 TRANSMITTER) MISC [id number]  Use for 90 days with Dexcom G6 sensors, Use to monitor your blood glucose continuously [name], DO  Active   DULoxetine (CYMBALTA) 20 MG capsule [id number]  Take 2 capsules (40 mg total) by mouth daily.  Patient not taking: Reported on 10/25/2023   [name], DO  Active   insulin aspart (NOVOLOG FLEXPEN) 100 UNIT/ML FlexPen [id number]  Inject 12 Units into the skin 3 (three) times daily with meals. Rana Snare, DO  Active   insulin aspart (NOVOLOG) 100 UNIT/ML injection [id number]  Use to fill V-GO insulin pump [name], DO  Active   Insulin Disposable Pump (OMNIPOD 5 DEXG7G6 PODS GEN 5) MISC [id number]  Apply and  use to administer up to 70 units of insulin daily as instructed [name], MD  Active   Insulin Disposable Pump (OMNIPOD 5 G6 INTRO, GEN 5,) KIT [id number]  Apply and use to administer up to 70 units of insulin daily as instructed [name], DO  Active   insulin glargine (LANTUS) 100 UNIT/ML Solostar Pen [id number]  Inject 30 Units into the skin at bedtime.  Patient not taking: Reported on 10/25/2023   Nooruddin, Jason Fila, MD  Active            Med Note CADDEN, CHERYL A   Mon Feb 28, 2023  2:26 PM) 25 units  Insulin Pen Needle (PEN NEEDLES) 32G X 4 MM MISC [id number]  Inject 1 each into the skin as needed. [name], MD  Active   metoprolol tartrate (LOPRESSOR) 100 MG tablet [id number]  Take 1 tablet (100 mg total) by mouth 2 hours prior to CT Sande Rives, MD  Expired 08/26/23 2359   predniSONE (DELTASONE) 50 MG tablet [id number]  Take 1 tablet 13 hours prior to test, 1 tablet 7 hours prior to test, and 1 tablet 1 hour prior to test  Patient not taking: Reported on 10/25/2023   Sande Rives, MD  Active             Recommendation:   PCP Follow-up  Follow Up Plan:   Telephone follow-up in 1 month   Juanell Fairly RN, BSN, [facility]  [facility], [facility]  Care Coordinator Phone: [phone]

## 2023-11-10 ENCOUNTER — Telehealth: Payer: Self-pay | Admitting: Dietician

## 2023-11-10 NOTE — Telephone Encounter (Signed)
 Call to patient due to report of low blood sugars and insulin pump data not showing any insulin. Ricky Boyle states 3 weeks ago blood sugars were low, took the omnipod insulin pump off, he is still monitoring  and is averaging 95% in target, 3% high and 1% low. Has not taken insulin for the past 3 weeks from any source. Eating well. Right now he just ate and his blood glucose is 102 mg.dl, yesterday after eating it was 168 mg/dL but  after  30 minutes  it decreased to 89 . Agreed to continue to monitor and follow up with our office soon. ( He thought he had an appointment here in 11/21/23 but it is cardiology) he agreed to call to schedule with our office.

## 2023-11-12 DIAGNOSIS — Z419 Encounter for procedure for purposes other than remedying health state, unspecified: Secondary | ICD-10-CM | POA: Diagnosis not present

## 2023-11-20 NOTE — Progress Notes (Deleted)
 Cardiology Clinic Note   Patient Name: Ricky Boyle Date of Encounter: 11/20/2023  Primary Care Provider:  Marni Sins, MD Primary Cardiologist:  None  Patient Profile    Ricky Boyle 63 year old male presents to the clinic today for follow-up evaluation of his chest pain.    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  Allergies  Allergies  Allergen Reactions   Bee Pollen Shortness Of Breath   Bee Venom Anaphylaxis   Shrimp [Shellfish Allergy] Anaphylaxis   Metformin  And Related Other (See Comments)    Sharp kidney pains, body felt bad   Penicillins Other (See Comments)    Pt does not know what the reaction was    History of Present Illness    Ricky Boyle has a PMH of hypertension, hyperlipidemia, diabetes, and cocaine abuse.  He was referred by his PCP for evaluation of chest pain.  He reported to having intermittent chest pain for a year.  He described his pain as sharp and occurring once or twice per week.  His pain was sometimes associated with numbness in his arm and vein protrusion.  His episodes were brief and triggered by stress or activity.  Deep breathing helped alleviate his symptoms.  His diabetes was previously uncontrolled.  He had an A1c of 13.8.  He was prescribed continuous glucose monitoring and his blood sugar levels improved.  He does not have a history of MI or CVA.  He did previously report severe headaches which made him feel like he was about to have a stroke.  He also  noted an episode of blood pressure spike with pressures in the 170s over 110's.  He was seen and evaluated by Dr. Lucy Sack 08/19/2023.  During that time he was smoking about half pack per day and was trying to quit smoking.  He did not alcohol or drug use.  He was noticing neuropathy in his feet and legs.  He was not working.  He reported he is widowed and has 3 children and 6 grandchildren.  He previously took lipid-lowering medication but stopped due to adverse side effects.  He had a coronary CTA which showed coronary calcium  score of 0.  He received a CAD RADS score of 1 for nonobstructive CAD and a total plaque volume of 39 mm.  He was noted to have noncalcified plaque and no flow attenuation plaque.  He presents to the clinic today for follow-up evaluation and states***.  *** denies chest pain, shortness of breath, lower extremity edema, fatigue, palpitations, melena, hematuria, hemoptysis, diaphoresis, weakness, presyncope, syncope, orthopnea, and PND.  Precordial chest pain-continues to have intermittent brief episodes of chest discomfort.  Episodes occur with stress or increased activity.  Coronary CTA 08/31/2023 reassuring.  Details above.  Chest discomfort does not appear to be related to cardiac issues. Patient reassured. Heart healthy low-sodium diet Increase physical activity as tolerated  Hyperlipidemia-LDL***. 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 Maintain blood pressure log Continue amlodipine , olmesartan , metoprolol   Low-sodium diet  Type 1 diabetes-continues to use continuous glucose monitor Follows with PCP  Tobacco use-now smoking***. Smoking cessation strongly recommended Smoking cessation information provided  Disposition: Follow-up with Dr. Rolm Clos or me in 9-12 months.  Home Medications    Prior to Admission medications   Medication Sig Start Date End Date Taking? Authorizing Provider  amLODipine -olmesartan  (AZOR ) 10-40 MG  tablet Take 1 tablet by mouth daily. 06/27/23   Carleen Chary, DO  atorvastatin  (LIPITOR) 20 MG tablet Take 1 tablet (20 mg total) by mouth daily. Patient not taking: Reported on 10/25/2023 08/19/23   Harrold Lincoln, MD  Blood Glucose Monitoring Suppl [facility] VERIO) w/Device KIT Use to check blood sugar. 10/05/22   Katsadouros, Vasilios, MD  buPROPion  (WELLBUTRIN  XL) 150 MG 24 hr tablet Take 1 tablet (150 mg total) by mouth daily. Patient not taking: Reported on 10/25/2023 06/07/23   Zheng, Michael, DO  cetaphil (CETAPHIL) lotion Apply 1 Application topically as needed for dry skin. Patient not taking: Reported on 10/25/2023 10/18/22   Katsadouros, Vasilios, MD  Continuous Glucose Receiver (DEXCOM G6 RECEIVER) DEVI Use to monitor your blood glucose continuously Patient not taking: Reported on 10/25/2023 04/20/23   Jeffie Mingle, MD  Continuous Glucose Sensor (DEXCOM G7 SENSOR) MISC Change every 10 days 08/30/23   Marni Sins, MD  Continuous Glucose Transmitter (DEXCOM G6 TRANSMITTER) MISC Use for 90 days with Dexcom G6 sensors, Use to monitor your blood glucose continuously 03/10/23   Carleen Chary, DO  DULoxetine  (CYMBALTA ) 20 MG capsule Take 2 capsules (40 mg total) by mouth daily. Patient not taking: Reported on 10/25/2023 08/25/23   Aurora Lees, DO  insulin  aspart (NOVOLOG  FLEXPEN) 100 UNIT/ML FlexPen Inject 12 Units into the skin 3 (three) times daily with meals. 06/07/23   Zheng, Michael, DO  insulin  aspart (NOVOLOG ) 100 UNIT/ML injection Use to fill V-GO insulin  pump 02/23/23   Carleen Chary, DO  Insulin  Disposable Pump (OMNIPOD 5 DEXG7G6 PODS GEN 5) MISC Apply and use to administer up to 70 units of insulin  daily as instructed 08/08/23   Marni Sins, MD  Insulin  Disposable Pump (OMNIPOD 5 G6 INTRO, GEN 5,) KIT Apply and use to administer up to 70 units of insulin  daily as instructed 02/25/23   Carleen Chary, DO  insulin  glargine (LANTUS ) 100 UNIT/ML Solostar Pen  Inject 30 Units into the skin at bedtime. Patient not taking: Reported on 10/25/2023 01/24/23   Nooruddin, Saad, MD  Insulin  Pen Needle (PEN NEEDLES) 32G X 4 MM MISC Inject 1 each into the skin as needed. 04/28/23   Koomson, Julius, MD  metoprolol  tartrate (LOPRESSOR ) 100 MG tablet Take 1 tablet (100 mg total) by mouth 2 hours prior to CT 08/19/23 08/26/23  O'Neal, Cathay Clonts, MD  predniSONE  (DELTASONE ) 50 MG tablet Take 1 tablet 13 hours prior to test, 1 tablet 7 hours prior to test, and 1 tablet 1 hour prior to test Patient not taking: Reported on 10/25/2023 08/19/23   Harrold Lincoln, MD    Family History    Family History  Problem Relation Age of Onset   Hypertension Mother    Hypertension Sister    Cancer Neg Hx    Heart attack Neg Hx    He indicated that the status of his mother is unknown. He indicated that the status of his sister is unknown. He indicated that the status of his neg hx is unknown.  Social History    Social History   Socioeconomic History   Marital status: Widowed  Spouse name: Not on file   Number of children: 3   Years of education: Not on file   Highest education level: Not on file  Occupational History   Not on file  Tobacco Use   Smoking status: Every Day    Current packs/day: 0.50    Average packs/day: 0.5 packs/day for 40.0 years (20.0 ttl pk-yrs)    Types: Cigarettes   Smokeless tobacco: Never  Substance and Sexual Activity   Alcohol use: No   Drug use: No   Sexual activity: Yes  Other Topics Concern   Not on file  Social History Narrative   Not on file   Social Drivers of Health   Financial Resource Strain: High Risk (09/23/2022)   Overall Financial Resource Strain (CARDIA)    Difficulty of Paying Living Expenses: Very hard  Food Insecurity: No Food Insecurity (08/15/2023)   Hunger Vital Sign    Worried About Running Out of Food in the Last Year: Never true    Ran Out of Food in the Last Year: Never true  Transportation Needs:  Unmet Transportation Needs (08/15/2023)   PRAPARE - Administrator, Civil Service (Medical): Yes    Lack of Transportation (Non-Medical): Yes  Physical Activity: Not on file  Stress: Not on file  Social Connections: Not on file  Intimate Partner Violence: Not At Risk (01/24/2023)   Humiliation, Afraid, Rape, and Kick questionnaire    Fear of Current or Ex-Partner: No    Emotionally Abused: No    Physically Abused: No    Sexually Abused: No     Review of Systems    General:  No chills, fever, night sweats or weight changes.  Cardiovascular:  No chest pain, dyspnea on exertion, edema, orthopnea, palpitations, paroxysmal nocturnal dyspnea. Dermatological: No rash, lesions/masses Respiratory: No cough, dyspnea Urologic: No hematuria, dysuria Abdominal:   No nausea, vomiting, diarrhea, bright red blood per rectum, melena, or hematemesis Neurologic:  No visual changes, wkns, changes in mental status. All other systems reviewed and are otherwise negative except as noted above.  Physical Exam    VS:  There were no vitals taken for this visit. , BMI There is no height or weight on file to calculate BMI. GEN: Well nourished, well developed, in no acute distress. HEENT: normal. Neck: Supple, no JVD, carotid bruits, or masses. Cardiac: RRR, no murmurs, rubs, or gallops. No clubbing, cyanosis, edema.  Radials/DP/PT 2+ and equal bilaterally.  Respiratory:  Respirations regular and unlabored, clear to auscultation bilaterally. GI: Soft, nontender, nondistended, BS + x 4. MS: no deformity or atrophy. Skin: warm and dry, no rash. Neuro:  Strength and sensation are intact. Psych: Normal affect.  Accessory Clinical Findings    Recent Labs: 08/19/2023: BUN 16; Creatinine, Ser 0.99; Potassium 4.4; Sodium 144   Recent Lipid Panel    Component Value Date/Time   CHOL 176 08/19/2023 1015   TRIG 69 08/19/2023 1015   HDL 57 08/19/2023 1015   CHOLHDL 3.1 08/19/2023 1015   CHOLHDL 8.8  04/20/2020 1232   VLDL 45 (H) 04/20/2020 1232   LDLCALC 106 (H) 08/19/2023 1015    No BP recorded.  {Refresh Note OR Click here to enter BP  :1}***    ECG personally reviewed by me today- ***    COMPARISON: Chest CT 04/16/2020  FINDINGS: Vascular: No aortic atherosclerosis. The included aorta is normal in caliber.  Mediastinum/nodes: No adenopathy or mass. Mild distal esophageal wall thickening.  Lungs: Emphysema. No focal airspace disease.  No pulmonary nodule. No pleural fluid. The included airways are patent.  Upper abdomen: No acute or unexpected findings.  Musculoskeletal: There are no acute or suspicious osseous abnormalities. Thoracic spondylosis.  IMPRESSION: Mild distal esophageal wall thickening, can be seen with reflux.   Electronically Signed By: Chadwick Colonel M.D. On: 09/10/2023 18:37   Study Result CLINICAL DATA: Chest pain  EXAM: Cardiac CTA  MEDICATIONS: Sub lingual nitro. 4mg  and lopressor  100mg   TECHNIQUE: The patient was scanned on a Siemens Force 192 slice scanner. Gantry rotation speed was 250 msecs. Collimation was .6 mm. A 80 kV prospective scan was triggered in the ascending thoracic aorta at 140 HU's Full mA was used between 35% and 75% of the R-R interval. Average HR during the scan was 63 bpm. The 3D data set was interpreted on a dedicated work station using MPR, MIP and VRT modes. A total of 80 cc of contrast was used.  FINDINGS: Non-cardiac: See separate report from [facility]. No significant findings on limited lung and soft tissue windows.  Calcium  Score: No calcium  noted  Coronary Arteries: Right dominant with no anomalies  LM: Normal  LAD: 1-24% soft plaque in mid vessel  D1: Normal  D2: Normal  Circumflex: Normal  OM1: Normal  OM2: Normal  RCA: 1-24% soft plaque in proximal vessel  PDA: Normal  PLA: Normal  IMPRESSION: 1. Calcium  score 0  2. Normal ascending thoracic aorta 3.4 cm  3. CAD  RADS 1 non obstructive CAD see description above  4. Total plaque volume only 39 mm3 with only non calcified plaque. No low attenuation plaque  Janelle Mediate  Electronically Signed: By: Janelle Mediate M.D. On: 09/01/2023 07:30      Assessment & Plan   1.  ***   Chet Cota. Duane Earnshaw NP-C     11/20/2023, 10:57 AM [facility] [address] Office [phone] Fax [phone]    I spent***minutes examining this patient, reviewing medications, and using patient centered shared decision making involving their cardiac care.   I spent  20 minutes reviewing past medical history,  medications, and prior cardiac tests.

## 2023-11-21 ENCOUNTER — Ambulatory Visit: Payer: Medicaid Other | Attending: General Practice | Admitting: General Practice

## 2023-11-22 ENCOUNTER — Encounter: Payer: Self-pay | Admitting: General Practice

## 2023-11-23 ENCOUNTER — Other Ambulatory Visit (HOSPITAL_COMMUNITY): Payer: Self-pay

## 2023-11-23 ENCOUNTER — Other Ambulatory Visit: Payer: Self-pay | Admitting: Student

## 2023-11-23 ENCOUNTER — Other Ambulatory Visit: Payer: Self-pay

## 2023-11-23 DIAGNOSIS — E1169 Type 2 diabetes mellitus with other specified complication: Secondary | ICD-10-CM

## 2023-11-23 NOTE — Telephone Encounter (Signed)
 Next appt scheduled 12/05/23 with PCP.

## 2023-11-25 ENCOUNTER — Other Ambulatory Visit: Payer: Self-pay

## 2023-11-25 MED ORDER — OMNIPOD 5 DEXG7G6 PODS GEN 5 MISC
1.0000 | Freq: Every day | 3 refills | Status: DC
  Filled 2023-11-25: qty 10, 30d supply, fill #0
  Filled 2024-01-23: qty 10, 30d supply, fill #1
  Filled 2024-04-10: qty 10, 30d supply, fill #2

## 2023-11-26 ENCOUNTER — Other Ambulatory Visit: Payer: Self-pay

## 2023-12-02 ENCOUNTER — Other Ambulatory Visit: Payer: Self-pay | Admitting: Student

## 2023-12-02 ENCOUNTER — Other Ambulatory Visit (HOSPITAL_COMMUNITY): Payer: Self-pay

## 2023-12-05 ENCOUNTER — Encounter: Admitting: Student

## 2023-12-05 ENCOUNTER — Other Ambulatory Visit (HOSPITAL_COMMUNITY): Payer: Self-pay

## 2023-12-05 MED ORDER — ONETOUCH VERIO W/DEVICE KIT
1.0000 | PACK | Freq: Every day | 1 refills | Status: AC
  Filled 2023-12-05: qty 1, 30d supply, fill #0

## 2023-12-05 NOTE — Progress Notes (Deleted)
 CC: Follow up on chronic medical conditions.   HPI:  Ricky Boyle is a 63 y.o. male living with a controlled type 2 diabetes, hypertension, and OSA who presents for follow-up.Aaron Aas Please see problem based assessment and plan for additional details.  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 90 tablet 3   atorvastatin  (LIPITOR) 20 MG tablet Take 1 tablet (20 mg total) by mouth daily. (Patient not taking: Reported on 10/25/2023) 90 tablet 3   Blood Glucose Monitoring Suppl (ONETOUCH VERIO) w/Device KIT Use to check blood sugar. 1 kit 1   buPROPion  (WELLBUTRIN  XL) 150 MG 24 hr tablet Take 1 tablet (150 mg total) by mouth daily. (Patient not taking: Reported on 10/25/2023) 90 tablet 1   cetaphil (CETAPHIL) lotion Apply 1 Application topically as needed for dry skin. (Patient not taking: Reported on 10/25/2023) 236 mL 0   Continuous Glucose Receiver (DEXCOM G6 RECEIVER) DEVI Use to monitor your blood glucose continuously (Patient not taking: Reported on 10/25/2023) 1 each 0   Continuous Glucose Sensor (DEXCOM G7 SENSOR) MISC Change every 10 days 9 each 1   Continuous Glucose Transmitter (DEXCOM G6 TRANSMITTER) MISC Use for 90 days with Dexcom G6 sensors, Use to monitor your blood glucose continuously 1 each 3   DULoxetine  (CYMBALTA ) 20 MG capsule Take 2 capsules (40 mg total) by mouth  daily. (Patient not taking: Reported on 10/25/2023) 60 capsule 1   insulin  aspart (NOVOLOG  FLEXPEN) 100 UNIT/ML FlexPen Inject 12 Units into the skin 3 (three) times daily with meals. 15 mL 11   insulin  aspart (NOVOLOG ) 100 UNIT/ML injection Use to fill V-GO insulin  pump 20 mL 11   Insulin  Disposable Pump (OMNIPOD 5 DEXG7G6 PODS GEN 5) MISC Apply and use to administer up to 70 units of insulin  daily as instructed 6 each 3   Insulin  Disposable Pump (OMNIPOD 5 G6 INTRO, GEN 5,) KIT Apply and use to administer up to 70 units of insulin  daily as instructed 1 kit 0   insulin  glargine (LANTUS ) 100 UNIT/ML Solostar Pen Inject 30 Units into the skin at bedtime. (Patient not taking: Reported on 10/25/2023) 15 mL 11   Insulin  Pen Needle (PEN NEEDLES) 32G X 4 MM MISC Inject 1 each into the skin as needed. 100 each 11   metoprolol  tartrate (LOPRESSOR ) 100 MG tablet Take 1 tablet (100 mg total) by mouth 2 hours prior to CT 1 tablet 0   predniSONE  (DELTASONE ) 50 MG tablet Take 1 tablet 13 hours prior to test, 1 tablet 7 hours prior to test, and 1 tablet 1 hour prior to test (Patient not taking: Reported on 10/25/2023) 3 tablet 0   No current facility-administered medications on file prior to visit.     Review of Systems: ROS negative except for what is noted on the assessment and plan.  There were no  vitals filed for this visit.  Last hemoglobin A1c Lab Results  Component Value Date   HGBA1C 6.1 (A) 08/25/2023      BP Readings from Last 3 Encounters:  08/31/23 130/80  08/25/23 111/70  08/19/23 (!) 138/90      Physical Exam: Constitutional: well-appearing ***  in no acute distress HENT: normocephalic atraumatic, mucous membranes moist Eyes: conjunctiva non-erythematous Cardiovascular: regular rate and rhythm, no m/r/g Pulmonary/Chest: normal work of breathing on room air, lungs clear to auscultation bilaterally Abdominal: soft, non-tender, non-distended MSK: normal bulk and tone Neurological:  alert & oriented x 3, 5/5 strength in bilateral upper and lower extremities, normal gait Skin: warm and dry  Assessment & Plan:   No problem-specific Assessment & Plan notes found for this encounter.  Hypertension His hypertension is well-controlled today with a blood pressure of 111/70.  He is on a current regimen of amlodipine -olmesartan  10-40 mg.  He denies any symptoms of hypotension.   Plan: -Will continue current medication regimen of amlodipine -olmesartan  10/40 mg - Check BMP.  T2DM  Will continue diabetes, A1c 6.1 about 4 months ago.  Will continue home insulin  pods  plan: -Will continue Omnipod insulin  pump -check A1c today.   OSA  Patient has a history of OSA and does not use CPAP at home.  I am unable to find a report of a prior sleep study.  Per patient, he had a polysomnography years ago.  He stated that he snores nightly and that he will wake up gasping for air every couple hours. Plan: -Polysomnography ordered  Diabetic neuropathy associated with underlying diabetes. Patient has a 1 year history of diabetic neuropathy. Patient has tried Lyrica  in the past which was helpful; however, he reported side effects and did not want to retry this medication. He stated that he has looked into gabapentin  and he does not want to try gabapentin  because he believes that the side effects outweigh the benefits. Patient does report bilateral neuropathy symptoms of pins-and-needles in his feet that I worsened within the past year.   - Cymbalta  20 mg and does not believe that this is helping his diabetic neuropathy. He was on 30 mg a few months ago and asked to go down on the regimen due to feeling "spacey" while taking Lyrica .  - retry  Cymbalta  30 mg?  Patient {GC/GE:[id number]::"discussed with","seen with"} Dr. {[id number]:[id number]::"NFAOZHYQ","M. Hoffman","Mullen","Narendra","Vincent","Guilloud","Lau","Machen"}  Marni Sins, MD [facility] Internal Medicine, PGY-1 Pager: [phone] Date  12/05/2023 Time 8:52 AM

## 2023-12-05 NOTE — Telephone Encounter (Signed)
 Medication sent to pharmacy

## 2023-12-06 ENCOUNTER — Other Ambulatory Visit: Payer: Self-pay

## 2023-12-06 ENCOUNTER — Other Ambulatory Visit (HOSPITAL_COMMUNITY): Payer: Self-pay

## 2023-12-09 ENCOUNTER — Other Ambulatory Visit: Payer: Self-pay

## 2023-12-09 NOTE — Patient Outreach (Signed)
 Complex Care Management   Visit Note  12/09/2023  Name:  Ricky Boyle MRN: [id number] DOB: [birth year]  Situation: Referral received for Complex Care Management related to HTN Pain I obtained verbal consent from Patient.  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   Hyperlipidemia    Opioid abuse (HCC)    Pancreatic pseudocyst    Pancreatitis, recurrent 05/02/2020    Assessment: Patient Reported Symptoms:  Cognitive Cognitive Status: Able to follow simple commands, Alert and oriented to person, place, and time      Neurological Neurological Review of Symptoms: No symptoms reported Neurological Self-Management Outcome: 3 (uncertain)  HEENT HEENT Symptoms Reported: No symptoms reported      Cardiovascular Cardiovascular Symptoms Reported: No symptoms reported    Respiratory Respiratory Symptoms Reported: No symptoms reported    Endocrine Is patient diabetic?: Yes Is patient checking blood sugars at home?: Yes Endocrine Conditions: Diabetes Endocrine Self-Management Outcome: 4 (good)  Gastrointestinal Gastrointestinal Symptoms Reported: No symptoms reported Gastrointestinal Self-Management Outcome: 4 (good)    Genitourinary Genitourinary Symptoms Reported: No symptoms reported    Integumentary Integumentary Symptoms Reported: No symptoms reported    Musculoskeletal Musculoskelatal Symptoms Reviewed: No symptoms reported   Falls in the past year?: No    Psychosocial Psychosocial Symptoms Reported: No symptoms reported     Quality of Family Relationships: supportive      12/09/2023    9:48 AM  Depression  screen PHQ 2/9  Decreased Interest 0  Down, Depressed, Hopeless 3  PHQ - 2 Score 3  Altered sleeping 3  Tired, decreased energy 0  Change in appetite 0  Feeling bad or failure about yourself  0  Trouble concentrating 0  Moving slowly or fidgety/restless 0  Suicidal thoughts 0  PHQ-9 Score 6    There were no vitals filed for this visit.  Medications Reviewed Today     Reviewed by Augustin Leber, RN (Registered Nurse) on 12/09/23 at [phone]  Med List Status: <None>   Medication Order Taking? Sig Documenting Provider Last Dose Status Informant  amLODipine -olmesartan  (AZOR ) 10-40 MG tablet [id number] Yes Take 1 tablet by mouth daily. Carleen Chary, DO Taking Active   atorvastatin  (LIPITOR) 20 MG tablet [id number] Yes Take 1 tablet (20 mg total) by mouth daily. Harrold Lincoln, MD Taking Active   Blood Glucose Monitoring Suppl [facility] VERIO) w/Device Suzanne Erps [id number] Yes Use to check blood sugar. Koomson, Julius, MD Taking Active   buPROPion  (WELLBUTRIN  XL) 150 MG 24 hr tablet [id number] No Take 1 tablet (150 mg total) by mouth daily.  Patient not taking: Reported on 12/09/2023   Jearldine Mina, DO Not Taking Active   cetaphil (CETAPHIL) lotion [id number] No Apply 1 Application topically as needed for dry skin.  Patient not taking: Reported on 12/09/2023   Katsadouros, Vasilios, MD Not Taking Active   Continuous Glucose Receiver [facility] G6 RECEIVER) DEVI [id number] No Use to monitor your blood glucose continuously  Patient not taking: Reported on 12/09/2023   Narendra, Nischal, MD Not Taking Active   Continuous Glucose Sensor (DEXCOM G7 SENSOR) MISC [id number] Yes Change every 10 days Koomson, Julius,  MD Taking Active   Continuous Glucose Transmitter (DEXCOM G6 TRANSMITTER) MISC [id number] No Use for 90 days with Dexcom G6 sensors, Use to monitor your blood glucose continuously Carleen Chary, DO Unknown Active   DULoxetine  (CYMBALTA ) 20 MG capsule [id number] No Take 2 capsules (40 mg  total) by mouth daily.  Patient not taking: Reported on 12/09/2023   Aurora Lees, DO Not Taking Active   insulin  aspart (NOVOLOG  FLEXPEN) 100 UNIT/ML FlexPen [id number]  Inject 12 Units into the skin 3 (three) times daily with meals. Jearldine Mina, DO  Active            Med Note Arleta Lade, Laymon Priest   Fri Dec 09, 2023  9:41 AM) He has it but does not use it it is a back up  insulin  aspart (NOVOLOG ) 100 UNIT/ML injection [id number]  Use to fill V-GO insulin  pump Carleen Chary, DO  Active            Med Note Arleta Lade, Laymon Priest   Fri Dec 09, 2023  9:41 AM) He has it as a back up   Insulin  Disposable Pump (OMNIPOD 5 DEXG7G6 PODS GEN 5) MISC [id number] No Apply and use to administer up to 70 units of insulin  daily as instructed  Patient not taking: Reported on 12/09/2023   Marni Sins, MD Not Taking Active            Med Note Arleta Lade, Laymon Priest   Fri Dec 09, 2023  9:40 AM) Has but not using  Insulin  Disposable Pump (OMNIPOD 5 G6 INTRO, GEN 5,) KIT [id number]  Apply and use to administer up to 70 units of insulin  daily as instructed Carleen Chary, DO  Active   insulin  glargine (LANTUS ) 100 UNIT/ML Solostar Pen [id number]  Inject 30 Units into the skin at bedtime.  Patient not taking: Reported on 10/25/2023   Nooruddin, Saad, MD  Active            Med Note MCNIFF, CHERYL A   Mon Feb 28, 2023  2:26 PM) 25 units  Insulin  Pen Needle (PEN NEEDLES) 32G X 4 MM MISC [id number]  Inject 1 each into the skin as needed. Koomson, Julius, MD  Active   metoprolol  tartrate (LOPRESSOR ) 100 MG tablet [id number]  Take 1 tablet (100 mg total) by mouth 2 hours prior to CT Harrold Lincoln, MD  Expired 08/26/23 2359   predniSONE  (DELTASONE ) 50 MG tablet [id number] No Take 1 tablet 13 hours prior to test, 1 tablet 7 hours prior to test, and 1 tablet 1 hour prior to test  Patient not taking: Reported on 12/09/2023   Harrold Lincoln, MD Not Taking Active             Recommendation:   PCP Follow-up  Follow Up  Plan:   Telephone follow up appointment with care management team member scheduled for:  01/11/24  930 am  Augustin Leber RN, BSN, [facility]  [facility], [facility]  Care Coordinator Phone: [phone]

## 2023-12-09 NOTE — Patient Instructions (Signed)
 Visit Information  Thank you for taking time to visit with me today. Please don't hesitate to contact me if I can be of assistance to you before our next scheduled appointment.  Your next care management appointment is by telephone  01/11/24  930 am  Please call the care guide team at [phone] if you need to cancel, schedule, or reschedule an appointment.   Please call [phone]-TALK (toll free, 24 hour hotline) if you are experiencing a Mental Health or Behavioral Health Crisis or need someone to talk to.  Augustin Leber RN, BSN, [facility]   [facility] [place], [facility] Health  Care Coordinator Phone: [phone]

## 2023-12-12 DIAGNOSIS — Z419 Encounter for procedure for purposes other than remedying health state, unspecified: Secondary | ICD-10-CM | POA: Diagnosis not present

## 2024-01-04 ENCOUNTER — Emergency Department (HOSPITAL_COMMUNITY)
Admission: EM | Admit: 2024-01-04 | Discharge: 2024-01-04 | Disposition: A | Discharge status: Home / Self Care | Attending: Emergency Medicine | Admitting: Emergency Medicine

## 2024-01-04 ENCOUNTER — Other Ambulatory Visit: Payer: Self-pay

## 2024-01-04 ENCOUNTER — Encounter (HOSPITAL_COMMUNITY): Payer: Self-pay

## 2024-01-04 ENCOUNTER — Other Ambulatory Visit (HOSPITAL_COMMUNITY): Payer: Self-pay

## 2024-01-04 DIAGNOSIS — E119 Type 2 diabetes mellitus without complications: Secondary | ICD-10-CM | POA: Diagnosis not present

## 2024-01-04 DIAGNOSIS — Z743 Need for continuous supervision: Secondary | ICD-10-CM | POA: Diagnosis not present

## 2024-01-04 DIAGNOSIS — L0201 Cutaneous abscess of face: Secondary | ICD-10-CM | POA: Insufficient documentation

## 2024-01-04 DIAGNOSIS — Z23 Encounter for immunization: Secondary | ICD-10-CM | POA: Diagnosis not present

## 2024-01-04 DIAGNOSIS — I10 Essential (primary) hypertension: Secondary | ICD-10-CM | POA: Diagnosis not present

## 2024-01-04 DIAGNOSIS — Z720 Tobacco use: Secondary | ICD-10-CM | POA: Diagnosis not present

## 2024-01-04 DIAGNOSIS — Z794 Long term (current) use of insulin: Secondary | ICD-10-CM | POA: Diagnosis not present

## 2024-01-04 DIAGNOSIS — R609 Edema, unspecified: Secondary | ICD-10-CM | POA: Diagnosis not present

## 2024-01-04 MED ORDER — IBUPROFEN 600 MG PO TABS
600.0000 mg | ORAL_TABLET | Freq: Four times a day (QID) | ORAL | 0 refills | Status: AC | PRN
Start: 2024-01-04 — End: ?
  Filled 2024-01-04 – 2024-01-05 (×2): qty 30, 8d supply, fill #0

## 2024-01-04 MED ORDER — LIDOCAINE-EPINEPHRINE (PF) 2 %-1:200000 IJ SOLN
10.0000 mL | Freq: Once | INTRAMUSCULAR | Status: AC
  Administered 2024-01-04: 10 mL via INTRADERMAL
  Filled 2024-01-04: qty 20

## 2024-01-04 MED ORDER — IBUPROFEN 800 MG PO TABS
800.0000 mg | ORAL_TABLET | Freq: Once | ORAL | Status: AC
  Administered 2024-01-04: 800 mg via ORAL
  Filled 2024-01-04: qty 1

## 2024-01-04 MED ORDER — TETANUS-DIPHTH-ACELL PERTUSSIS 5-2.5-18.5 LF-MCG/0.5 IM SUSY
0.5000 mL | PREFILLED_SYRINGE | Freq: Once | INTRAMUSCULAR | Status: AC
  Administered 2024-01-04: 0.5 mL via INTRAMUSCULAR
  Filled 2024-01-04: qty 0.5

## 2024-01-04 MED ORDER — DOXYCYCLINE HYCLATE 100 MG PO CAPS
100.0000 mg | ORAL_CAPSULE | Freq: Two times a day (BID) | ORAL | 0 refills | Status: AC
Start: 2024-01-04 — End: ?
  Filled 2024-01-04 – 2024-01-05 (×2): qty 20, 10d supply, fill #0

## 2024-01-04 NOTE — ED Provider Notes (Signed)
 [place] EMERGENCY DEPARTMENT AT [facility] Provider Note   CSN: [id number] Arrival date & time: 01/04/24  5621     History  Chief Complaint  Patient presents with   Angioedema    Ricky Boyle is a 63 y.o. male.  The history is provided by the patient and medical records. No language interpreter was used.     63 year old male with history of diabetes, polysubstance use, recurrent pancreatitis, housing instability, brought via EMS from home with complaints of facial swelling.  Patient reports several days ago he noticed a "head bump" on the right cheek.  He attempted to pop it and then within the past 2 days he noticed increased pain and swelling to the affected area.  He denies any dental pain no fever chills no neck pain.  He is unsure his last tetanus status.  No recent medication changes.  No trouble swallowing.  Home Medications Prior to Admission medications   Medication Sig Start Date End Date Taking? Authorizing Provider  amLODipine -olmesartan  (AZOR ) 10-40 MG tablet Take 1 tablet by mouth daily. 06/27/23   Carleen Chary, DO  atorvastatin  (LIPITOR) 20 MG tablet Take 1 tablet (20 mg total) by mouth daily. 08/19/23   O'NealCathay Clonts, MD  Blood Glucose Monitoring Suppl (ONETOUCH VERIO) w/Device KIT Use to check blood sugar. 12/05/23   Marni Sins, MD  buPROPion  (WELLBUTRIN  XL) 150 MG 24 hr tablet Take 1 tablet (150 mg total) by mouth daily. Patient not taking: Reported on 12/09/2023 06/07/23   Zheng, Michael, DO  cetaphil (CETAPHIL) lotion Apply 1 Application topically as needed for dry skin. Patient not taking: Reported on 12/09/2023 10/18/22   Katsadouros, Vasilios, MD  Continuous Glucose Receiver (DEXCOM G6 RECEIVER) DEVI Use to monitor your blood glucose continuously Patient not taking: Reported on 12/09/2023 04/20/23   Narendra, Nischal, MD  Continuous Glucose Sensor (DEXCOM G7 SENSOR) MISC Change every 10 days 08/30/23   Marni Sins, MD  Continuous  Glucose Transmitter (DEXCOM G6 TRANSMITTER) MISC Use for 90 days with Dexcom G6 sensors, Use to monitor your blood glucose continuously 03/10/23   Carleen Chary, DO  DULoxetine  (CYMBALTA ) 20 MG capsule Take 2 capsules (40 mg total) by mouth daily. Patient not taking: Reported on 12/09/2023 08/25/23   Aurora Lees, DO  insulin  aspart (NOVOLOG  FLEXPEN) 100 UNIT/ML FlexPen Inject 12 Units into the skin 3 (three) times daily with meals. 06/07/23   Zheng, Michael, DO  insulin  aspart (NOVOLOG ) 100 UNIT/ML injection Use to fill V-GO insulin  pump 02/23/23   Carleen Chary, DO  Insulin  Disposable Pump (OMNIPOD 5 DEXG7G6 PODS GEN 5) MISC Apply and use to administer up to 70 units of insulin  daily as instructed Patient not taking: Reported on 12/09/2023 11/25/23   Marni Sins, MD  Insulin  Disposable Pump (OMNIPOD 5 G6 INTRO, GEN 5,) KIT Apply and use to administer up to 70 units of insulin  daily as instructed 02/25/23   Carleen Chary, DO  insulin  glargine (LANTUS ) 100 UNIT/ML Solostar Pen Inject 30 Units into the skin at bedtime. Patient not taking: Reported on 10/25/2023 01/24/23   Nooruddin, Saad, MD  Insulin  Pen Needle (PEN NEEDLES) 32G X 4 MM MISC Inject 1 each into the skin as needed. 04/28/23   Koomson, Julius, MD  metoprolol  tartrate (LOPRESSOR ) 100 MG tablet Take 1 tablet (100 mg total) by mouth 2 hours prior to CT 08/19/23 08/26/23  O'Neal, Cathay Clonts, MD  predniSONE  (DELTASONE ) 50 MG tablet Take 1 tablet 13 hours prior to test, 1 tablet 7  hours prior to test, and 1 tablet 1 hour prior to test Patient not taking: Reported on 12/09/2023 08/19/23   Harrold Lincoln, MD      Allergies    Bee pollen, Bee venom, Shrimp [shellfish allergy], Metformin  and related, and Penicillins    Review of Systems   Review of Systems  All other systems reviewed and are negative.   Physical Exam Updated Vital Signs BP (!) 142/82   Pulse 99   Temp 98.1 F (36.7 C) (Oral)   Resp 20   SpO2 100%   Physical Exam Constitutional:      General: He is not in acute distress.    Appearance: He is well-developed.     Comments: Patient laying bed appears to be in no acute discomfort.  HENT:     Head: Atraumatic.     Comments: An area of induration with fluctuant approximately 4 cm in diameter noted to right cheek.  It is tender to palpation.  A small scab overlying the area.  Poor dentition but no dental pain. Eyes:     Conjunctiva/sclera: Conjunctivae normal.  Cardiovascular:     Rate and Rhythm: Normal rate and regular rhythm.     Pulses: Normal pulses.     Heart sounds: Normal heart sounds.  Musculoskeletal:     Cervical back: Normal range of motion and neck supple. No rigidity.  Lymphadenopathy:     Cervical: No cervical adenopathy.  Skin:    Findings: No rash.  Neurological:     Mental Status: He is alert.     ED Results / Procedures / Treatments   Labs (all labs ordered are listed, but only abnormal results are displayed) Labs Reviewed - No data to display  EKG None  Radiology No results found.  Procedures .Incision and Drainage  Date/Time: 01/04/2024 10:40 AM  Performed by: Debbra Fairy, PA-C Authorized by: Debbra Fairy, PA-C   Consent:    Consent obtained:  Verbal   Consent given by:  Patient   Risks discussed:  Bleeding, incomplete drainage, pain and damage to other organs   Alternatives discussed:  No treatment Universal protocol:    Procedure explained and questions answered to patient or proxy's satisfaction: yes     Relevant documents present and verified: yes     Test results available : yes     Imaging studies available: yes     Required blood products, implants, devices, and special equipment available: yes     Site/side marked: yes     Immediately prior to procedure, a time out was called: yes     Patient identity confirmed:  Verbally with patient Location:    Type:  Abscess   Size:  4   Location:  Head   Head location:  Face Pre-procedure  details:    Skin preparation:  Betadine Anesthesia:    Anesthesia method:  Local infiltration   Local anesthetic:  Lidocaine  2% WITH epi Procedure type:    Complexity:  Complex Procedure details:    Incision types:  Single straight   Incision depth:  Subcutaneous   Wound management:  Probed and deloculated, irrigated with saline and extensive cleaning   Drainage:  Purulent   Drainage amount:  Moderate   Packing materials:  None Post-procedure details:    Procedure completion:  Tolerated well, no immediate complications     Medications Ordered in ED Medications  lidocaine -EPINEPHrine  (XYLOCAINE  W/EPI) 2 %-1:200000 (PF) injection 10 mL (has no administration in time range)  ibuprofen (ADVIL)  tablet 800 mg (has no administration in time range)  Tdap (BOOSTRIX) injection 0.5 mL (0.5 mLs Intramuscular Given 01/04/24 1017)    ED Course/ Medical Decision Making/ A&P                                 Medical Decision Making  BP (!) 142/82   Pulse 99   Temp 98.1 F (36.7 C) (Oral)   Resp 20   SpO2 100%   19:9 AM  63 year old male with history of diabetes, polysubstance use, recurrent pancreatitis, housing instability, brought via EMS from home with complaints of facial swelling.  Patient reports several days ago he noticed a "head bump" on the right cheek.  He attempted to pop it and then within the past 2 days he noticed increased pain and swelling to the affected area.  He denies any dental pain no fever chills no neck pain.  He is unsure his last tetanus status.  No recent medication changes.  No trouble swallowing.  Exam notable for an area of induration and fluctuance to right cheek on patient's face consistent with an abscess.  A bedside ultrasound was performed by me confirming a pocket of abscess.  Will perform incision and drainage, will update tetanus.    Successful incision and drainage of facial abscess.  Patient discharged home with antibiotic, doxycycline , recommend warm  compress and patient received return precaution.  Ibuprofen as needed for pain.  DDx: Cellulitis, abscess, pseudocyst, angioedema, dental infection.  Associate general health including tobacco use, lack of transportation, financial difficulty.        Final Clinical Impression(s) / ED Diagnoses Final diagnoses:  Cutaneous abscess of face    Rx / DC Orders ED Discharge Orders          Ordered    ibuprofen (ADVIL) 600 MG tablet  Every 6 hours PRN        01/04/24 1046    doxycycline  (VIBRAMYCIN ) 100 MG capsule  2 times daily        01/04/24 1046              Debbra Fairy, PA-C 01/04/24 1048    Hershel Los, MD 01/04/24 1341

## 2024-01-04 NOTE — Discharge Instructions (Signed)
 Please apply warm moist compress to affected area several times daily to aid with healing.  Take antibiotic as prescribed.  Take ibuprofen as needed for pain.  Return if you have any concern.

## 2024-01-04 NOTE — ED Triage Notes (Signed)
 PT BIB EMS from home, right sided mouth/lip swelling, starting about an hour ago.  No injury, not in contact with any allergens.    150/100 HR 96 CBG 144 97% RA  98.2

## 2024-01-05 ENCOUNTER — Other Ambulatory Visit: Payer: Self-pay

## 2024-01-05 ENCOUNTER — Other Ambulatory Visit (HOSPITAL_COMMUNITY): Payer: Self-pay

## 2024-01-11 ENCOUNTER — Other Ambulatory Visit: Payer: Self-pay

## 2024-01-11 NOTE — Patient Instructions (Signed)
 Visit Information  Ricky Boyle was given information about Medicaid Managed Care team care coordination services as a part of their [facility] Medicaid benefit. Ricky Boyle verbally consented to engagement with the [facility] Managed Care team.   If you are experiencing a medical emergency, please call 911 or report to your local emergency department or urgent care.   If you have a non-emergency medical problem during routine business hours, please contact your provider's office and ask to speak with a nurse.   For questions related to your [facility] health plan, please call: [phone] or go here:[email or web address]  If you would like to schedule transportation through your [facility] plan, please call the following number at least 2 days in advance of your appointment: [phone].   You can also use the MTM portal or MTM mobile app to manage your rides. Reimbursement for transportation is available through [facility]! For the portal, please go to [email or web address]  Call the [facility] Crisis Line at [phone], at any time, 24 hours a day, 7 days a week. If you are in danger or need immediate medical attention call 911.  If you would like help to quit smoking, call 1-800-QUIT-NOW (270-316-1328) OR Espaol: 1-855-Djelo-Ya (4-132-440-1027) o para ms informacin haga clic aqu or Text READY to 253-664 to register via text  Ricky Boyle - following are the goals we discussed in your visit today:   Goals Addressed             This Visit's Progress    VBCI RN Care Plan       Problems:  Current Barriers:  Knowledge Deficits related to self-health management of acute or chronic pain Chronic Disease Management support and education needs related to chronic pain    Clinical Goal(s):  Over the next 90 days, patient will verbalize understanding of plan for managing pain patient will demonstrate use of different relaxation  skills and/or diversional  activities to assist with pain reduction (distraction, imagery, relaxation, massage, acupressure, TENS, heat, and cold application., patient will report pain at a level less than 3 to 4 on a 10-10 rating scale., and patient will engage in desired activities without an increase in pain level  Interventions:  Collaboration with Koomson, Julius, MD regarding development and update of comprehensive plan of care as evidenced by provider attestation and co-signature Discussed plans with patient for ongoing care management follow up and provided patient with direct contact information for care management team Patient will continue to place his feet on a pillow and apply small amount of pressure to alleviate some pain Elevate his feet when able  Patient Goals/Self Care Activities:  Will self-administer medications as prescribed Will attend all scheduled provider appointments Will call pharmacy for medication refills 7 days prior to needed refill date Patient will calls provider office for new concerns or questions    Plan:  Telephone follow up appointment with care management team member scheduled for:  02/10/24  1000 am             Please see education materials related to HTN and DM II provided by MyChart link.  The patient verbalized understanding of instructions, educational materials, and care plan provided today and DECLINED offer to receive copy of patient instructions, educational materials, and care plan.   Telephone follow up appointment with care management team member scheduled for:  02/10/24  1000 am  Augustin Leber RN, BSN, [facility]  [facility], [facility] Health   Care Coordinator Phone: [phone]  Following is a copy of your plan of care:  There are no care plans that you recently modified to display for this patient.

## 2024-01-11 NOTE — Patient Outreach (Signed)
 Complex Care Management   Visit Note  01/11/2024  Name:  Ricky Boyle MRN: [id number] DOB: [birth year]  Situation: Referral received for Complex Care Management related to Diabetes and HTN I obtained verbal consent from Patient.  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   Hyperlipidemia    Opioid abuse (HCC)    Pancreatic pseudocyst    Pancreatitis, recurrent 05/02/2020    Assessment: Patient Reported Symptoms:  Cognitive Cognitive Status: Able to follow simple commands, Alert and oriented to person, place, and time, Incoherent or nonsensical speech      Neurological Neurological Review of Symptoms: No symptoms reported    HEENT HEENT Symptoms Reported: No symptoms reported      Cardiovascular Cardiovascular Symptoms Reported: No symptoms reported Does patient have uncontrolled Hypertension?: No Cardiovascular Conditions: Hypertension  Respiratory Respiratory Symptoms Reported: No symptoms reported    Endocrine Is patient diabetic?: Yes Is patient checking blood sugars at home?: Yes Endocrine Conditions: Diabetes  Gastrointestinal Gastrointestinal Symptoms Reported: Constipation Gastrointestinal Conditions: Constipation Gastrointestinal Management Strategies: Medication therapy Gastrointestinal Comment: advised to take colace 100 mg 1 daily    Genitourinary Genitourinary Symptoms Reported: No symptoms reported    Integumentary Additional Integumentary Details: hair bump Skin Conditions: Other Other Skin Conditions: hair bump Skin Management Strategies: Medication therapy  Musculoskeletal Musculoskelatal  Symptoms Reviewed: Difficulty walking Musculoskeletal Conditions: Back pain Musculoskeletal Management Strategies: Medication therapy Musculoskeletal Comment: Ibuprofen  Falls in the past year?: No    Psychosocial       Quality of Family Relationships: supportive, involved Do you feel physically threatened by others?: No      01/11/2024    9:59 AM  Depression screen PHQ 2/9  Decreased Interest 1  Down, Depressed, Hopeless 0  PHQ - 2 Score 1    There were no vitals filed for this visit.  Medications Reviewed Today     Reviewed by Augustin Leber, RN (Registered Nurse) on 01/11/24 at [phone]  Med List Status: <None>   Medication Order Taking? Sig Documenting Provider Last Dose Status Informant  amLODipine -olmesartan  (AZOR ) 10-40 MG tablet [id number] Yes Take 1 tablet by mouth daily. Carleen Chary, DO Taking Active   atorvastatin  (LIPITOR) 20 MG tablet [id number] Yes Take 1 tablet (20 mg total) by mouth daily. Harrold Lincoln, MD Taking Active   Blood Glucose Monitoring Suppl [facility] VERIO) w/Device Suzanne Erps [id number] Yes Use to check blood sugar. Marni Sins, MD Taking Active   buPROPion  (WELLBUTRIN  XL) 150 MG 24 hr tablet [id number] No Take 1 tablet (150 mg total) by mouth daily.  Patient not taking: Reported on 10/25/2023   Jearldine Mina, DO Not Taking Active   cetaphil (CETAPHIL) lotion [id number] No Apply 1 Application topically as needed for dry skin.  Patient not taking: Reported on 10/25/2023   Katsadouros, Vasilios, MD Not Taking Active   Continuous Glucose Receiver Yvonna Herder G6 RECEIVER) DEVI [id number] Yes Use to monitor your blood glucose continuously Narendra, Nischal, MD Taking Active   Continuous Glucose Sensor (DEXCOM G7 SENSOR) MISC [id number] Yes Change every 10 days Marni Sins, MD Taking Active   Continuous Glucose Transmitter (DEXCOM G6 TRANSMITTER) MISC  [id number] Yes Use for 90 days with Dexcom G6 sensors, Use to monitor your blood glucose continuously  Carleen Chary, DO Taking Active   doxycycline  (VIBRAMYCIN ) 100 MG capsule [id number] Yes Take 1 capsule (100 mg total) by mouth 2 (two) times daily. Debbra Fairy, PA-C Taking Active   DULoxetine  (CYMBALTA ) 20 MG capsule [id number] No Take 2 capsules (40 mg total) by mouth daily.  Patient not taking: Reported on 10/25/2023   Aurora Lees, DO Not Taking Active   ibuprofen  (ADVIL ) 600 MG tablet [id number] Yes Take 1 tablet (600 mg total) by mouth every 6 (six) hours as needed. Debbra Fairy, PA-C Taking Active   insulin  aspart (NOVOLOG  FLEXPEN) 100 UNIT/ML FlexPen [id number] No Inject 12 Units into the skin 3 (three) times daily with meals.  Patient not taking: Reported on 01/11/2024   Zheng, Michael, DO Not Taking Active            Med Note Arleta Lade, Laymon Priest   Fri Dec 09, 2023  9:41 AM) He has it but does not use it it is a back up  insulin  aspart (NOVOLOG ) 100 UNIT/ML injection [id number] No Use to fill V-GO insulin  pump  Patient not taking: Reported on 01/11/2024   Carleen Chary, DO Not Taking Active            Med Note Arleta Lade, Laymon Priest   Fri Dec 09, 2023  9:41 AM) He has it as a back up   Insulin  Disposable Pump (OMNIPOD 5 DEXG7G6 PODS GEN 5) MISC [id number] No Apply and use to administer up to 70 units of insulin  daily as instructed  Patient not taking: Reported on 01/11/2024   Marni Sins, MD Not Taking Active            Med Note Arleta Lade, Laymon Priest   Fri Dec 09, 2023  9:40 AM) Has but not using  Insulin  Disposable Pump (OMNIPOD 5 G6 INTRO, GEN 5,) KIT [id number] Yes Apply and use to administer up to 70 units of insulin  daily as instructed Carleen Chary, DO Taking Active   insulin  glargine (LANTUS ) 100 UNIT/ML Solostar Pen [id number] No Inject 30 Units into the skin at bedtime.  Patient not taking: Reported on 01/11/2024   Nooruddin, Saad, MD Not Taking Active            Med Note OBEIRNE, CHERYL A   Mon Feb 28, 2023  2:26 PM) 25 units  Insulin  Pen Needle (PEN NEEDLES) 32G X 4 MM MISC  [id number] Yes Inject 1 each into the skin as needed. Marni Sins, MD Taking Active   metoprolol  tartrate (LOPRESSOR ) 100 MG tablet [id number]  Take 1 tablet (100 mg total) by mouth 2 hours prior to CT Harrold Lincoln, MD  Expired 08/26/23 2359   predniSONE  (DELTASONE ) 50 MG tablet [id number] No Take 1 tablet 13 hours prior to test, 1 tablet 7 hours prior to test, and 1 tablet 1 hour prior to test  Patient not taking: Reported on 10/25/2023   Harrold Lincoln, MD Not Taking Active             Recommendation:   PCP Follow-up  Follow Up Plan:   Telephone follow up appointment with care management team member scheduled for:  02/10/24  1000 am  Augustin Leber RN, BSN, [facility] [place]  [facility], [facility]   Care Coordinator Phone: [phone]

## 2024-01-12 DIAGNOSIS — Z419 Encounter for procedure for purposes other than remedying health state, unspecified: Secondary | ICD-10-CM | POA: Diagnosis not present

## 2024-01-23 ENCOUNTER — Other Ambulatory Visit: Payer: Self-pay | Admitting: Student

## 2024-01-23 ENCOUNTER — Other Ambulatory Visit: Payer: Self-pay | Admitting: Cardiovascular Disease

## 2024-01-23 DIAGNOSIS — F32A Depression, unspecified: Secondary | ICD-10-CM

## 2024-01-24 ENCOUNTER — Other Ambulatory Visit: Payer: Self-pay

## 2024-01-24 MED ORDER — BUPROPION HCL ER (XL) 150 MG PO TB24
150.0000 mg | ORAL_TABLET | Freq: Every day | ORAL | 1 refills | Status: AC
  Filled 2024-01-24: qty 90, 90d supply, fill #0
  Filled 2024-05-31: qty 90, 90d supply, fill #1

## 2024-01-24 NOTE — Telephone Encounter (Signed)
 Medication sent to pharmacy

## 2024-01-26 ENCOUNTER — Other Ambulatory Visit: Payer: Self-pay

## 2024-01-26 ENCOUNTER — Other Ambulatory Visit (HOSPITAL_COMMUNITY): Payer: Self-pay

## 2024-01-26 MED ORDER — METOPROLOL TARTRATE 100 MG PO TABS
100.0000 mg | ORAL_TABLET | Freq: Once | ORAL | 0 refills | Status: AC
  Filled 2024-01-26: qty 1, 1d supply, fill #0

## 2024-02-10 ENCOUNTER — Telehealth: Payer: Self-pay

## 2024-02-11 DIAGNOSIS — Z419 Encounter for procedure for purposes other than remedying health state, unspecified: Secondary | ICD-10-CM | POA: Diagnosis not present

## 2024-02-15 ENCOUNTER — Other Ambulatory Visit: Payer: Self-pay

## 2024-02-15 NOTE — Patient Instructions (Signed)
 Visit Information  Ricky Boyle was given information about Medicaid Managed Care team care coordination services as a part of their [facility] Medicaid benefit. Ricky Boyle verbally consented to engagement with the [facility] Managed Care team.   If you are experiencing a medical emergency, please call 911 or report to your local emergency department or urgent care.   If you have a non-emergency medical problem during routine business hours, please contact your provider's office and ask to speak with a nurse.   For questions related to your [facility] health plan, please call: [phone] or go here:[email or web address]  If you would like to schedule transportation through your [facility] plan, please call the following number at least 2 days in advance of your appointment: [phone].   You can also use the MTM portal or MTM mobile app to manage your rides. Reimbursement for transportation is available through [facility]! For the portal, please go to [email or web address]  Call the [facility] Crisis Line at [phone], at any time, 24 hours a day, 7 days a week. If you are in danger or need immediate medical attention call 911.  If you would like help to quit smoking, call 1-800-QUIT-NOW (917-336-5226) OR Espaol: 1-855-Djelo-Ya (8-144-664-6430) o para ms informacin haga clic aqu or Text READY to 799-599 to register via text  Ricky Boyle - following are the goals we discussed in your visit today:   Goals Addressed             This Visit's Progress    patient stated - He needs to monitor his HTN       Objective:  Last practice recorded BP readings:  BP Readings from Last 3 Encounters:  01/04/24 (!) 142/82  08/31/23 130/80  08/25/23 111/70   Most recent eGFR/CrCl:  Lab Results  Component Value Date   EGFR 87 08/19/2023    No components found for: CRCL   Current Barriers:  Knowledge Deficits related to basic understanding of hypertension  pathophysiology and self care management   Case Manager Clinical Goal(s): patient will attend all scheduled medical appointments patient will demonstrate improved adherence to prescribed treatment plan for hypertension as evidenced by taking all medications as prescribed, monitoring and recording blood pressure as directed, adhering to low sodium/DASH diet  Interventions:   STOP LIGHT  Hypertension (HTN) or High Blood Pressure Definition: A condition in which the pressure in your arteries is higher than it should be. Blood pressure is written as two numbers such as 131/84. The top number represents the pressure in the arteries when the heart beats. The bottom number represents the pressure in the arteries in between heart beats.    Some Hypertension signs/symptoms:  It is very important to know that most people with high blood pressure experience no signs or symptoms. However, in the early stages of hypertension, some may experience the following symptoms: Dizzy spells or headaches Nosebleeds Flushing around the face Blurred vision or feeling confused Chest pain or abnormal heartbeat  Things you can do: Decrease the salt in your diet and consume healthy foods Increase your activity while maintaining a healthy weight Manage your stress Don't smoke Monitor your blood pressure at home with a blood pressure device     Evaluation of current treatment plan related to hypertension self management and patient's adherence to plan as established by provider. Provided education to patient re: stroke prevention, s/s of heart attack and stroke, DASH diet, complications of uncontrolled blood pressure Reviewed medications with patient and discussed importance of compliance Discussed  plans with patient for ongoing care management follow up and provided patient with direct contact information for care management team Advised patient, providing education and rationale, to monitor blood pressure  daily and record, calling PCP for findings outside established parameters.  Reviewed scheduled/upcoming provider appointments including: 02/27/24 with Dr reginold   Self-Care Activities: - Self administers medications as prescribed Attends all scheduled provider appointments Calls provider office for new concerns, questions, or BP outside discussed parameters Checks BP and records as discussed Follows a low sodium diet/DASH diet   Patient Goals: - check blood pressure 3 times per week - write blood pressure results in a log or diary  Plan: Telephone follow up appointment with care management team member scheduled for: 03/19/24 1145 am with Ricky Boyle [facility]       VBCI RN Care Plan       Problems:  Current Barriers:  Knowledge Deficits related to self-health management of acute or chronic pain Chronic Disease Management support and education needs related to chronic pain    Clinical Goal(s):  Over the next 90 days, patient will verbalize understanding of plan for managing pain patient will demonstrate use of different relaxation  skills and/or diversional activities to assist with pain reduction (distraction, imagery, relaxation, massage, acupressure, TENS, heat, and cold application., patient will report pain at a level less than 3 to 4 on a 10-10 rating scale., and patient will engage in desired activities without an increase in pain level  Interventions:  Collaboration with Koomson, Julius, MD regarding development and update of comprehensive plan of care as evidenced by provider attestation and co-signature Discussed plans with patient for ongoing care management follow up and provided patient with direct contact information for care management team Patient will continue to place his feet on a pillow and apply small amount of pressure to alleviate some pain Elevate his feet when able Discuss problems with your feet at the appointment on 02/27/24 with Dr Celestina  Patient  Goals/Self Care Activities:  Will self-administer medications as prescribed Will attend all scheduled provider appointments Will call pharmacy for medication refills 7 days prior to needed refill date Patient will calls provider office for new concerns or questions    Plan:  Telephone follow up appointment with care management team member scheduled for:  03/19/24  1145 with Ricky Boyle RNCM am             Please see education materials related to DM type 1 HTN provided by MyChart link.  Patient verbalizes understanding of instructions and care plan provided today and agrees to view in MyChart. Active MyChart status and patient understanding of how to access instructions and care plan via MyChart confirmed with patient.     Telephone follow up appointment with care management team member scheduled for:  03/19/24  1145 with Ricky Boyle RNCM am  Wilbert Diver RN, BSN, [facility] [place]  [facility], [facility]    Care Coordinator Phone: [phone]      Following is a copy of your plan of care:  There are no care plans that you recently modified to display for this patient.

## 2024-02-15 NOTE — Patient Outreach (Signed)
 Complex Care Management   Visit Note  02/15/2024  Name:  Ricky Boyle MRN: [id number] DOB: 09-Apr-1961  Situation: Referral received for Complex Care Management related to Diabetes with Complications and HTN I obtained verbal consent from Patient.  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   Hyperlipidemia    Opioid abuse (HCC)    Pancreatic pseudocyst    Pancreatitis, recurrent 05/02/2020    Assessment: Patient Reported Symptoms:  Cognitive Cognitive Status: Able to follow simple commands, Alert and oriented to person, place, and time, Normal speech and language skills      Neurological Neurological Review of Symptoms: No symptoms reported    HEENT HEENT Symptoms Reported: No symptoms reported      Cardiovascular Cardiovascular Symptoms Reported: No symptoms reported    Respiratory Respiratory Symptoms Reported: No symptoms reported    Endocrine Is patient diabetic?: Yes Is patient checking blood sugars at home?: Yes List most recent blood sugar readings, include date and time of day: 99 this morning 132 at 1140 1 hour after meal    Gastrointestinal Gastrointestinal Symptoms Reported: No symptoms reported      Genitourinary Genitourinary Symptoms Reported: No symptoms reported    Integumentary Integumentary Symptoms Reported: No symptoms reported    Musculoskeletal Musculoskelatal Symptoms Reviewed: Difficulty walking Additional Musculoskeletal Details: feet hurt   Falls in the past year?: No    Psychosocial       Quality of Family Relationships: supportive Do you feel physically threatened by  others?: No      02/15/2024   11:52 AM  Depression screen PHQ 2/9  Decreased Interest 0  Down, Depressed, Hopeless 0  PHQ - 2 Score 0    There were no vitals filed for this visit.  Medications Reviewed Today     Reviewed by Weyman Corning, RN (Registered Nurse) on 02/15/24 at 1144  Med List Status: <None>   Medication Order Taking? Sig Documenting Provider Last Dose Status Informant  amLODipine -olmesartan  (AZOR ) 10-40 MG tablet [id number] Yes Take 1 tablet by mouth daily. Harrie Bruckner, DO  Active   atorvastatin  (LIPITOR) 20 MG tablet [id number]  Take 1 tablet (20 mg total) by mouth daily.  Patient not taking: Reported on 02/15/2024   Barbaraann Darryle Ned, MD  Active   Blood Glucose Monitoring Suppl [facility] VERIO) w/Device KIT [id number] Yes Use to check blood sugar. Koomson, Julius, MD  Active   buPROPion  (WELLBUTRIN  XL) 150 MG 24 hr tablet [id number]  Take 1 tablet (150 mg total) by mouth daily.  Patient not taking: Reported on 02/15/2024   Koomson, Julius, MD  Active   cetaphil (CETAPHIL) lotion [id number]  Apply 1 Application topically as needed for dry skin.  Patient not taking: Reported on 02/15/2024   Katsadouros, Vasilios, MD  Active   Continuous Glucose Receiver (DEXCOM G6 RECEIVER) DEVI [id number] Yes Use to monitor your blood glucose continuously Narendra, Nischal, MD  Active   Continuous Glucose Sensor (DEXCOM G7 SENSOR) MISC [id number] Yes Change every 10 days Celestina Czar, MD  Active   Continuous Glucose Transmitter (DEXCOM G6 TRANSMITTER) MISC [id number] Yes Use for 90 days with  Dexcom G6 sensors, Use to monitor your blood glucose continuously Harrie Bruckner, DO  Active   doxycycline  (VIBRAMYCIN ) 100 MG capsule [id number]  Take 1 capsule (100 mg total) by mouth 2 (two) times daily.  Patient not taking: Reported on 02/15/2024   Nivia Colon, PA-C  Active   DULoxetine  (CYMBALTA ) 20 MG capsule [id number]  Take 2 capsules (40 mg total) by mouth daily.  Patient not  taking: Reported on 02/15/2024   Kandis Perkins, DO  Active   ibuprofen  (ADVIL ) 600 MG tablet [id number] Yes Take 1 tablet (600 mg total) by mouth every 6 (six) hours as needed. Nivia Colon, PA-C  Active   insulin  aspart (NOVOLOG  FLEXPEN) 100 UNIT/ML FlexPen [id number] Yes Inject 12 Units into the skin 3 (three) times daily with meals. Elicia Sharper, DO  Active            Med Note DANNIS, WILBERT GRADE   Fri Dec 09, 2023  9:41 AM) He has it but does not use it it is a back up  insulin  aspart (NOVOLOG ) 100 UNIT/ML injection [id number] Yes Use to fill V-GO insulin  pump Harrie Bruckner, DO  Active            Med Note DANNIS, WILBERT GRADE   Fri Dec 09, 2023  9:41 AM) He has it as a back up   Insulin  Disposable Pump (OMNIPOD 5 DEXG7G6 PODS GEN 5) MISC [id number] Yes Apply and use to administer up to 70 units of insulin  daily as instructed Celestina Czar, MD  Active            Med Note DANNIS, WILBERT GRADE   Fri Dec 09, 2023  9:40 AM) Has but not using  Insulin  Disposable Pump (OMNIPOD 5 G6 INTRO, GEN 5,) KIT [id number] Yes Apply and use to administer up to 70 units of insulin  daily as instructed Harrie Bruckner, DO  Active   insulin  glargine (LANTUS ) 100 UNIT/ML Solostar Pen [id number] Yes Inject 30 Units into the skin at bedtime. Nooruddin, Saad, MD  Active            Med Note ALBORNOZ, CHERYL A   Mon Feb 28, 2023  2:26 PM) 25 units  Insulin  Pen Needle (PEN NEEDLES) 32G X 4 MM MISC [id number] Yes Inject 1 each into the skin as needed. Koomson, Julius, MD  Active   metoprolol  tartrate (LOPRESSOR ) 100 MG tablet [id number] Yes Take 1 tablet (100 mg total) by mouth 2 hours prior to CT O'Neal, Darryle Ned, MD  Active   predniSONE  (DELTASONE ) 50 MG tablet [id number]  Take 1 tablet 13 hours prior to test, 1 tablet 7 hours prior to test, and 1 tablet 1 hour prior to test  Patient not taking: Reported on 02/15/2024   Barbaraann Darryle Ned, MD  Active             Recommendation:   PCP Follow-up 02/27/24 with Dr.  Celestina  Follow Up Plan:   Telephone follow up appointment with care management team member scheduled for:  03/19/24  1145 with Rosaline Finlay RNCM am  WILBERT Diver RN, BSN, [facility]  [facility], [facility]    Care Coordinator Phone: [phone]

## 2024-02-26 NOTE — Progress Notes (Deleted)
 CC: Follow-up on chronic medical conditions  HPI:  [name] is a 63 y.o. male living with a history stated below and presents today for FU on chronic medical condition.  Patient was last seen in resident [facility] on January 2025.  No acute concerns at that visit, his A1c had improved from 13.8 >>6.1.  Please see problem based assessment and plan for additional details.  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 90 tablet 3   atorvastatin  (LIPITOR) 20 MG tablet Take 1 tablet (20 mg total) by mouth daily. (Patient not taking: Reported on 02/15/2024) 90 tablet 3   Blood Glucose Monitoring Suppl (ONETOUCH VERIO) w/Device KIT Use to check blood sugar. 1 kit 1   buPROPion  (WELLBUTRIN  XL) 150 MG 24 hr tablet Take 1 tablet (150 mg total) by mouth daily. (Patient not taking: Reported on 02/15/2024) 90 tablet 1   cetaphil (CETAPHIL) lotion Apply 1 Application topically as needed for dry skin. (Patient not taking: Reported on 02/15/2024) 236 mL 0   Continuous Glucose Receiver (DEXCOM G6 RECEIVER) DEVI Use to monitor your blood glucose continuously 1 each 0   Continuous Glucose Sensor (DEXCOM G7 SENSOR) MISC Change every 10 days 9 each 1   Continuous Glucose Transmitter (DEXCOM G6 TRANSMITTER) MISC Use for 90 days with Dexcom G6 sensors, Use to monitor your blood glucose continuously 1 each 3   doxycycline  (VIBRAMYCIN ) 100  MG capsule Take 1 capsule (100 mg total) by mouth 2 (two) times daily. (Patient not taking: Reported on 02/15/2024) 20 capsule 0   DULoxetine  (CYMBALTA ) 20 MG capsule Take 2 capsules (40 mg total) by mouth daily. (Patient not taking: Reported on 02/15/2024) 60 capsule 1   ibuprofen  (ADVIL ) 600 MG tablet Take 1 tablet (600 mg total) by mouth every 6 (six) hours as needed. 30 tablet 0   insulin  aspart (NOVOLOG  FLEXPEN) 100 UNIT/ML FlexPen Inject 12 Units into the skin 3 (three) times daily with meals. 15 mL 11   insulin  aspart (NOVOLOG ) 100 UNIT/ML injection Use to fill V-GO insulin  pump 20 mL 11   Insulin  Disposable Pump (OMNIPOD 5 DEXG7G6 PODS GEN 5) MISC Apply and use to administer up to 70 units of insulin  daily as instructed 6 each 3   Insulin  Disposable Pump (OMNIPOD 5 G6 INTRO, GEN 5,) KIT Apply and use to administer up to 70 units of insulin  daily as instructed 1 kit 0   insulin  glargine (LANTUS ) 100 UNIT/ML Solostar Pen Inject 30 Units into the skin at bedtime. 15 mL 11   Insulin  Pen Needle (PEN NEEDLES) 32G X 4 MM MISC Inject 1 each into the skin as needed. 100 each 11   metoprolol  tartrate (LOPRESSOR ) 100 MG tablet Take 1 tablet (100 mg total) by mouth 2 hours prior to CT 1 tablet 0   predniSONE  (DELTASONE ) 50 MG tablet Take 1 tablet 13 hours prior to test, 1  tablet 7 hours prior to test, and 1 tablet 1 hour prior to test (Patient not taking: Reported on 02/15/2024) 3 tablet 0   No current facility-administered medications on file prior to visit.    Review of Systems: ROS negative except for what is noted on the assessment and plan.  There were no vitals filed for this visit. {Labs (Optional):23779} {Vitals History (Optional):23777}  Physical Exam: Constitutional: NAD Cardiovascular: RRR, no murmurs. Pulmonary/Chest: Clear bilateral lungs Abdominal: soft, non-tender, non-distended.  Assessment & Plan:   Patient {GC/GE:[id number]::discussed with,seen with} Dr.  {WJFZD:[id number]::Tpoopjfd,Z. Hoffman,Chambliss, Winfrey,Lau,Machen}  No problem-specific Assessment & Plan notes found for this encounter.   Hypertension:  His hypertension is well-controlled today with a blood pressure of 111/70.  He is on a current regimen of amlodipine -olmesartan  10-40 mg.  He denies any symptoms of hypotension.  He had a BMP in 1 week ago with a serum creatinine of 0.99. Plan: -Will continue current medication regimen of amlodipine -olmesartan  10/40 mg - Order BMP.   Type 2 diabetes The patient presents to the clinic for a follow-up of his previously poorly controlled diabetes.  His A1c was 13.8 in June 2024.  Today's A1c has improved to 6.1 while using the Omnipod.  It appears that the patient's average insulin  usage per day 6.3 units of insulin  per day.  Per CGM there was one low at 58. Plan: -Will continue Omnipod insulin  pump -Follow-up in 3 months of patient's well-controlled diabetes   Diabetic neuropathy:  Patient has a 1 year history of diabetic neuropathy.  Patient has tried Lyrica  in the past which was helpful; however, he reported side effects and did not want to retry this medication.  He stated that he has looked into gabapentin  and he does not want to try gabapentin  because he believes that the side effects outweigh the benefits.  Patient does report bilateral neuropathy symptoms of pins-and-needles in his feet that I worsened within the past year.  He is on a current regimen of Cymbalta  20 mg and does not believe that this is helping his diabetic neuropathy.  He was on 30 mg a few months ago and asked to go down on the regimen due to feeling spacey while taking Lyrica . Plan: -Increase to 40 mg every night, patient is agreeable to plan  -Will refer to podiatry(patient saw podiatrist in past and would like to see a different one)    Tatjana Turcott, MD [facility] Internal Medicine, PGY-2  Date 02/26/2024 Time 9:53 PM

## 2024-02-27 ENCOUNTER — Encounter: Admitting: Student

## 2024-03-02 ENCOUNTER — Encounter: Admitting: Student

## 2024-03-02 NOTE — Progress Notes (Deleted)
 CC: Follow-up on chronic medical conditions  HPI:  Ricky Boyle is a 63 y.o. male living with a history stated below and presents today for FU on chronic medical condition.  Patient was last seen in resident [facility] on January 2025.  No acute concerns at that visit, his A1c had improved from 13.8 >>6.1.  He was last seen in the emergency room on 01/04/2024, for cutaneous abscess of the face.  With discharge on antibiotics.Since discharge he endorses a swelling to the face has resolved.  Please see problem based assessment and plan for additional details.  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 90 tablet 3   atorvastatin  (LIPITOR) 20 MG tablet Take 1 tablet (20 mg total) by mouth daily. (Patient not taking: Reported on 02/15/2024) 90 tablet 3   Blood Glucose Monitoring Suppl (ONETOUCH VERIO) w/Device KIT Use to check blood sugar. 1 kit 1   buPROPion  (WELLBUTRIN  XL) 150 MG 24 hr tablet Take 1 tablet (150 mg total) by mouth daily. (Patient not taking: Reported on 02/15/2024) 90 tablet 1   cetaphil (CETAPHIL) lotion Apply 1 Application topically as needed for dry skin. (Patient not taking: Reported on 02/15/2024) 236 mL 0   Continuous Glucose Receiver (DEXCOM G6 RECEIVER) DEVI Use to monitor your blood glucose continuously 1 each 0   Continuous Glucose Sensor (DEXCOM G7 SENSOR) MISC Change every 10 days 9 each 1    Continuous Glucose Transmitter (DEXCOM G6 TRANSMITTER) MISC Use for 90 days with Dexcom G6 sensors, Use to monitor your blood glucose continuously 1 each 3   doxycycline  (VIBRAMYCIN ) 100 MG capsule Take 1 capsule (100 mg total) by mouth 2 (two) times daily. (Patient not taking: Reported on 02/15/2024) 20 capsule 0   DULoxetine  (CYMBALTA ) 20 MG capsule Take 2 capsules (40 mg total) by mouth daily. (Patient not taking: Reported on 02/15/2024) 60 capsule 1   ibuprofen  (ADVIL ) 600 MG tablet Take 1 tablet (600 mg total) by mouth every 6 (six) hours as needed. 30 tablet 0   insulin  aspart (NOVOLOG  FLEXPEN) 100 UNIT/ML FlexPen Inject 12 Units into the skin 3 (three) times daily with meals. 15 mL 11   insulin  aspart (NOVOLOG ) 100 UNIT/ML injection Use to fill V-GO insulin  pump 20 mL 11   Insulin  Disposable Pump (OMNIPOD 5 DEXG7G6 PODS GEN 5) MISC Apply and use to administer up to 70 units of insulin  daily as instructed 6 each 3   Insulin  Disposable Pump (OMNIPOD 5 G6 INTRO, GEN 5,) KIT Apply and use to administer up to 70 units of insulin  daily as instructed 1 kit 0   insulin  glargine (LANTUS ) 100 UNIT/ML Solostar Pen Inject 30 Units into the skin at bedtime. 15 mL 11   Insulin  Pen Needle (PEN NEEDLES) 32G X 4 MM MISC Inject 1 each into the skin as needed. 100 each 11   metoprolol  tartrate (LOPRESSOR ) 100 MG tablet  Take 1 tablet (100 mg total) by mouth 2 hours prior to CT 1 tablet 0   predniSONE  (DELTASONE ) 50 MG tablet Take 1 tablet 13 hours prior to test, 1 tablet 7 hours prior to test, and 1 tablet 1 hour prior to test (Patient not taking: Reported on 02/15/2024) 3 tablet 0   No current facility-administered medications on file prior to visit.    Review of Systems: ROS negative except for what is noted on the assessment and plan.  There were no vitals filed for this visit. {Labs (Optional):23779} {Vitals History (Optional):23777}  Physical Exam: Constitutional: NAD Cardiovascular: RRR, no  murmurs. Pulmonary/Chest: Clear bilateral lungs Abdominal: soft, non-tender, non-distended.  Assessment & Plan:   Patient {GC/GE:[id number]::discussed with,seen with} Dr. {WJFZD:[id number]::Tpoopjfd,Z. Hoffman,Chambliss, Winfrey,Lau,Machen}  No problem-specific Assessment & Plan notes found for this encounter.   Hypertension:  His hypertension is well-controlled today with a blood pressure of 111/70.  He is on a current regimen of amlodipine -olmesartan  10-40 mg.  He denies any symptoms of hypotension.  He had a BMP in 1 week ago with a serum creatinine of 0.99. Plan: -Will continue current medication regimen of amlodipine -olmesartan  10/40 mg - Order BMP.   Type 2 diabetes The patient presents to the clinic for a follow-up of his previously poorly controlled diabetes.  His A1c was 13.8 in June 2024.  Today's A1c has improved to 6.1 while using the Omnipod.  It appears that the patient's average insulin  usage per day 6.3 units of insulin  per day.  Per CGM there was one low at 58. Plan: -Will continue Omnipod insulin  pump -Follow-up in 3 months of patient's well-controlled diabetes   Diabetic neuropathy:  Patient has a 1 year history of diabetic neuropathy.  Patient has tried Lyrica  in the past which was helpful; however, he reported side effects and did not want to retry this medication.  He stated that he has looked into gabapentin  and he does not want to try gabapentin  because he believes that the side effects outweigh the benefits.  Patient does report bilateral neuropathy symptoms of pins-and-needles in his feet that I worsened within the past year.  He is on a current regimen of Cymbalta  20 mg and does not believe that this is helping his diabetic neuropathy.  He was on 30 mg a few months ago and asked to go down on the regimen due to feeling spacey while taking Lyrica . Plan: -Increase to 40 mg every night, patient is agreeable to plan  -Will refer to podiatry(patient saw  podiatrist in past and would like to see a different one)    Fintan Grater, MD [facility] Internal Medicine, PGY-2  Date 03/02/2024 Time 8:21 AM

## 2024-03-13 DIAGNOSIS — Z419 Encounter for procedure for purposes other than remedying health state, unspecified: Secondary | ICD-10-CM | POA: Diagnosis not present

## 2024-03-19 ENCOUNTER — Telehealth: Payer: Self-pay

## 2024-03-19 NOTE — Patient Outreach (Signed)
 Care Coordination   03/19/2024 Name: Morry Veiga MRN: [id number] DOB: November 04, 1960   Care Coordination Outreach Attempts:  An unsuccessful outreach was attempted for an appointment today.  Follow Up Plan:  Additional outreach attempts will be made to complete CCM follow-up visit.   Encounter Outcome:  No Answer. HIPAA compliant voicemail left requesting return call.   Rosaline Finlay, RN MSN [place]  [facility] RN Care Manager Direct Dial : [phone]  Fax: [phone]

## 2024-03-23 NOTE — Patient Outreach (Signed)
 Care Coordination   03/23/2024 Name: Ricky Boyle MRN: [id number] DOB: 1961-03-24   Care Coordination Outreach Attempts:  A second unsuccessful outreach was attempted today to complete CCM follow-up visit.  Follow Up Plan:  Additional outreach attempts will be made to complete CCM follow-up visit.   Encounter Outcome:  No Answer. HIPAA compliant voicemail left requesting return call.   Rosaline Finlay, RN MSN [place]  [facility] Health RN Care Manager Direct Dial : [phone]  Fax: [phone]

## 2024-03-27 ENCOUNTER — Other Ambulatory Visit

## 2024-03-27 NOTE — Patient Outreach (Signed)
 Complex Care Management   Visit Note  03/27/2024  Name:  Ricky Boyle MRN: [id number] DOB: 12/13/1960  Situation: Referral received for Complex Care Management related to Diabetes with Complications and HTN I obtained verbal consent from Patient.  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   Hyperlipidemia    Opioid abuse (HCC)    Pancreatic pseudocyst    Pancreatitis, recurrent 05/02/2020    Assessment: Patient Reported Symptoms:  Cognitive Cognitive Status: Able to follow simple commands, Alert and oriented to person, place, and time, Normal speech and language skills Cognitive/Intellectual Conditions Management [RPT]: None reported or documented in medical history or problem list      Neurological Neurological Review of Symptoms: No symptoms reported    HEENT HEENT Symptoms Reported: No symptoms reported      Cardiovascular Cardiovascular Symptoms Reported: No symptoms reported Does patient have uncontrolled Hypertension?: Yes Is patient checking Blood Pressure at home?: Yes Patient's Recent BP reading at home: 135/87 Cardiovascular Management Strategies: Medication therapy  Respiratory Respiratory Symptoms Reported: No symptoms reported    Endocrine Endocrine Symptoms Reported: No symptoms reported Is patient diabetic?: Yes Is patient checking blood sugars at home?: Yes List most recent blood sugar readings, include date and time of day: Dexcom G7, Average blood sugar 103 Endocrine Comment: Patient notes he is not using any medications for diabetes control.  Gastrointestinal Gastrointestinal Symptoms  Reported: No symptoms reported Additional Gastrointestinal Details: Last BM today      Genitourinary Genitourinary Symptoms Reported: No symptoms reported    Integumentary Integumentary Symptoms Reported: No symptoms reported    Musculoskeletal Musculoskelatal Symptoms Reviewed: No symptoms reported Additional Musculoskeletal Details: Patient reports pain in his feet is improved since previous visit.   Falls in the past year?: No Number of falls in past year: 1 or less Was there an injury with Fall?: No Fall Risk Category Calculator: 0 Patient Fall Risk Level: Low Fall Risk Patient at Risk for Falls Due to: No Fall Risks Fall risk Follow up: Falls evaluation completed, Education provided  Psychosocial Psychosocial Symptoms Reported: No symptoms reported          03/27/2024    PHQ2-9 Depression Screening   Little interest or pleasure in doing things Not at all  Feeling down, depressed, or hopeless Not at all  PHQ-2 - Total Score 0  Trouble falling or staying asleep, or sleeping too much    Feeling tired or having little energy    Poor appetite or overeating     Feeling bad about yourself - or that you are a failure or have let yourself or your family down    Trouble concentrating on things, such as reading the newspaper or watching television    Moving or speaking so slowly that other people could have noticed.  Or the opposite - being so fidgety or restless that you have been moving around a lot more than usual    Thoughts that you would be better off dead, or hurting yourself in some way    PHQ2-9 Total Score    If you checked off any problems, how difficult  have these problems made it for you to do your work, take care of things at home, or get along with other people    Depression Interventions/Treatment      There were no vitals filed for this visit.  Medications Reviewed Today     Reviewed by Arno Rosaline SQUIBB, RN (Registered Nurse) on 03/27/24 at 1423  Med List  Status: <None>   Medication Order Taking? Sig Documenting Provider Last Dose Status Informant  amLODipine -olmesartan  (AZOR ) 10-40 MG tablet [id number] Yes Take 1 tablet by mouth daily. Harrie Bruckner, DO  Active   atorvastatin  (LIPITOR) 20 MG tablet [id number]  Take 1 tablet (20 mg total) by mouth daily.  Patient not taking: Reported on 03/27/2024   Barbaraann Darryle Ned, MD  Active   Blood Glucose Monitoring Suppl [facility] VERIO) w/Device KIT [id number]  Use to check blood sugar. Koomson, Julius, MD  Active   buPROPion  (WELLBUTRIN  XL) 150 MG 24 hr tablet [id number]  Take 1 tablet (150 mg total) by mouth daily.  Patient not taking: Reported on 03/27/2024   Celestina Czar, MD  Active   cetaphil (CETAPHIL) lotion [id number]  Apply 1 Application topically as needed for dry skin.  Patient not taking: Reported on 03/27/2024   Katsadouros, Vasilios, MD  Active   Continuous Glucose Receiver (DEXCOM G6 RECEIVER) DEVI [id number]  Use to monitor your blood glucose continuously  Patient not taking: Reported on 03/27/2024   Onesimo Claude, MD  Consider Medication Status and Discontinue (Change in therapy)   Continuous Glucose Sensor (DEXCOM G7 SENSOR) MISC [id number] Yes Change every 10 days Celestina Czar, MD  Active   Continuous Glucose Transmitter (DEXCOM G6 TRANSMITTER) MISC [id number]  Use for 90 days with Dexcom G6 sensors, Use to monitor your blood glucose continuously  Patient not taking: Reported on 03/27/2024   Harrie Bruckner, DO  Consider Medication Status and Discontinue (Change in therapy)   doxycycline  (VIBRAMYCIN ) 100 MG capsule [id number]  Take 1 capsule (100 mg total) by mouth 2 (two) times daily.  Patient not taking: Reported on 03/27/2024   Nivia Colon, PA-C  Consider Medication Status and Discontinue (Completed Course)   DULoxetine  (CYMBALTA ) 20 MG capsule [id number]  Take 2 capsules (40 mg total) by mouth daily.  Patient not taking: Reported on 03/27/2024   Kandis Perkins, DO  Active    ibuprofen  (ADVIL ) 600 MG tablet [id number]  Take 1 tablet (600 mg total) by mouth every 6 (six) hours as needed.  Patient not taking: Reported on 03/27/2024   Nivia Colon, PA-C  Active   insulin  aspart (NOVOLOG  FLEXPEN) 100 UNIT/ML FlexPen [id number]  Inject 12 Units into the skin 3 (three) times daily with meals.  Patient not taking: Reported on 03/27/2024   Elicia Sharper, DO  Active            Med Note DANNIS, WILBERT GRADE   Fri Dec 09, 2023  9:41 AM) He has it but does not use it it is a back up  insulin  aspart (NOVOLOG ) 100 UNIT/ML injection [id number]  Use to fill V-GO insulin  pump  Patient not taking: Reported on 03/27/2024   Harrie Bruckner, DO  Active            Med Note DANNIS, WILBERT GRADE   Fri Dec 09, 2023  9:41 AM) He has it as a back up   Insulin  Disposable Pump (OMNIPOD 5 DEXG7G6 PODS GEN 5) MISC [id number]  Apply and use to administer up to 70 units of insulin  daily as instructed  Patient not taking: Reported on 03/27/2024   Celestina Czar, MD  Active            Med Note DANNIS, WILBERT GRADE   Fri Dec 09, 2023  9:40 AM) Has but not using  Insulin  Disposable Pump (OMNIPOD 5 G6 INTRO, GEN 5,) KIT [id number]  Apply and use to administer up to 70 units of insulin  daily as instructed  Patient not taking: Reported on 03/27/2024   Harrie Bruckner, DO  Active   insulin  glargine (LANTUS ) 100 UNIT/ML Solostar Pen [id number]  Inject 30 Units into the skin at bedtime.  Patient not taking: Reported on 03/27/2024   Nooruddin, Saad, MD  Active            Med Note PLOTKIN, CHERYL A   Mon Feb 28, 2023  2:26 PM) 25 units  Insulin  Pen Needle (PEN NEEDLES) 32G X 4 MM MISC [id number]  Inject 1 each into the skin as needed.  Patient not taking: Reported on 03/27/2024   Koomson, Julius, MD  Active   metoprolol  tartrate (LOPRESSOR ) 100 MG tablet [id number]  Take 1 tablet (100 mg total) by mouth 2 hours prior to CT  Patient not taking: Reported on 03/27/2024   Barbaraann Darryle Ned, MD  Expired 02/15/24 2359    predniSONE  (DELTASONE ) 50 MG tablet [id number]  Take 1 tablet 13 hours prior to test, 1 tablet 7 hours prior to test, and 1 tablet 1 hour prior to test  Patient not taking: Reported on 03/27/2024   Barbaraann Darryle Ned, MD  Consider Medication Status and Discontinue (Completed Course)             Recommendation:   Continue Current Plan of Care  Follow Up Plan:   Closing From:  Complex Care Management Patient has met all care management goals. Care Management case will be closed. Patient has been provided contact information should new needs arise.   Rosaline Finlay, RN MSN [place]  [facility] Health RN Care Manager Direct Dial : [phone]  Fax: [phone]

## 2024-03-27 NOTE — Patient Instructions (Signed)
 Visit Information  Ricky Boyle was given information about Medicaid Managed Care team care coordination services as a part of their [facility] Medicaid benefit.   If you would like to schedule transportation through your [facility] plan, please call the following number at least 2 days in advance of your appointment: [phone].   You can also use the MTM portal or MTM mobile app to manage your rides. Reimbursement for transportation is available through [facility]! For the portal, please go to [email or web address]  Call the [facility] Crisis Line at [phone], at any time, 24 hours a day, 7 days a week. If you are in danger or need immediate medical attention call 911.   Ricky Boyle - following are the goals we discussed in your visit today:   Goals Addressed             This Visit's Progress    COMPLETED: [facility]BC[facility] RN Care Plan   On track    Problems:  Chronic Disease Management support and education needs related to HTN, DM   Clinical Goal(s):  Over the next 30 days, patient will locate and schedule appointment with new PCP per patient request/preference Demonstrate continued adherence to prescribed treatment plan for diabetes as evidenced by blood sugar control Demonstrate continued adherence to prescribed treatment plan for HTN as evidenced by home BP readings <130/80  Interventions:  Discussed plans with patient for ongoing care management follow up and provided patient with direct contact information for care management team Advised patient that he is due for a PCP visit and A1c recheck. Patient reports he would like to find a new provider at a different location. Advised that he contact his insurance to request a list of providers in network and to review list to see what offices are convenient for him/have providers that he would be interested in visiting.  Patient Goals/Self Care Activities:  Will self-administer medications as prescribed Will call pharmacy for  medication refills 7 days prior to needed refill date Patient will calls provider office for new concerns or questions Patient will locate new provider and schedule a visit for continued management of chronic conditions    Plan:  No further follow up required: Patient has met care plan goals             Patient verbalizes understanding of instructions and care plan provided today and agrees to view in MyChart. Active MyChart status and patient understanding of how to access instructions and care plan via MyChart confirmed with patient.     Patient                                              will locate new PCP and schedule an appointment for continued management of chronic conditions. No further follow up required: Patient has met care plan goals.  Rosaline Finlay, RN MSN [place]  [facility] Population Health RN Care Manager Direct Dial : [phone]  Fax: [phone]   Following is a copy of your plan of care:  There are no care plans that you recently modified to display for this patient.

## 2024-04-10 ENCOUNTER — Other Ambulatory Visit (HOSPITAL_COMMUNITY): Payer: Self-pay

## 2024-04-10 ENCOUNTER — Other Ambulatory Visit: Payer: Self-pay | Admitting: Student

## 2024-04-10 ENCOUNTER — Other Ambulatory Visit: Payer: Self-pay | Admitting: Cardiovascular Disease

## 2024-04-10 ENCOUNTER — Other Ambulatory Visit: Payer: Self-pay

## 2024-04-10 DIAGNOSIS — E1169 Type 2 diabetes mellitus with other specified complication: Secondary | ICD-10-CM

## 2024-04-10 MED ORDER — INSULIN ASPART 100 UNIT/ML IJ SOLN
INTRAMUSCULAR | 11 refills | Status: AC
  Filled 2024-04-10: qty 20, 30d supply, fill #0
  Filled 2024-05-31: qty 20, 30d supply, fill #1
  Filled 2024-07-04: qty 20, 30d supply, fill #2
  Filled 2024-08-15: qty 20, 30d supply, fill #3

## 2024-04-10 MED ORDER — LANTUS SOLOSTAR 100 UNIT/ML ~~LOC~~ SOPN
30.0000 [IU] | PEN_INJECTOR | Freq: Every day | SUBCUTANEOUS | 0 refills | Status: DC
  Filled 2024-04-10: qty 15, 50d supply, fill #0

## 2024-04-10 MED ORDER — OMNIPOD 5 DEXG7G6 PODS GEN 5 MISC
1.0000 | Freq: Every day | 0 refills | Status: DC
  Filled 2024-04-10: qty 5, 15d supply, fill #0
  Filled 2024-05-31 – 2024-08-15 (×3): qty 5, 15d supply, fill #1

## 2024-04-10 NOTE — Telephone Encounter (Signed)
 Medication sent to pharmacy

## 2024-04-10 NOTE — Telephone Encounter (Deleted)
 Medication sent to pharmacy

## 2024-04-11 ENCOUNTER — Other Ambulatory Visit: Payer: Self-pay

## 2024-04-12 ENCOUNTER — Encounter (HOSPITAL_COMMUNITY): Payer: Self-pay

## 2024-04-12 ENCOUNTER — Other Ambulatory Visit (HOSPITAL_COMMUNITY): Payer: Self-pay

## 2024-04-13 DIAGNOSIS — Z419 Encounter for procedure for purposes other than remedying health state, unspecified: Secondary | ICD-10-CM | POA: Diagnosis not present

## 2024-04-19 ENCOUNTER — Ambulatory Visit

## 2024-05-13 DIAGNOSIS — Z419 Encounter for procedure for purposes other than remedying health state, unspecified: Secondary | ICD-10-CM | POA: Diagnosis not present

## 2024-05-31 ENCOUNTER — Other Ambulatory Visit (HOSPITAL_COMMUNITY): Payer: Self-pay

## 2024-05-31 ENCOUNTER — Other Ambulatory Visit: Payer: Self-pay | Admitting: Cardiovascular Disease

## 2024-05-31 ENCOUNTER — Other Ambulatory Visit: Payer: Self-pay | Admitting: Student

## 2024-05-31 DIAGNOSIS — E1169 Type 2 diabetes mellitus with other specified complication: Secondary | ICD-10-CM

## 2024-05-31 DIAGNOSIS — I10 Essential (primary) hypertension: Secondary | ICD-10-CM

## 2024-06-01 ENCOUNTER — Other Ambulatory Visit: Payer: Self-pay

## 2024-06-01 ENCOUNTER — Other Ambulatory Visit (HOSPITAL_COMMUNITY): Payer: Self-pay

## 2024-06-01 MED ORDER — AMLODIPINE-OLMESARTAN 10-40 MG PO TABS
1.0000 | ORAL_TABLET | Freq: Every day | ORAL | 3 refills | Status: AC
  Filled 2024-06-01 – 2024-07-04 (×2): qty 90, 90d supply, fill #0
  Filled 2024-09-07: qty 90, 90d supply, fill #1

## 2024-06-01 MED ORDER — DEXCOM G7 SENSOR MISC
1.0000 | Freq: Every day | 1 refills | Status: AC
  Filled 2024-06-01: qty 9, 90d supply, fill #0
  Filled 2024-09-01: qty 9, 90d supply, fill #1

## 2024-06-01 MED ORDER — LANTUS SOLOSTAR 100 UNIT/ML ~~LOC~~ SOPN
30.0000 [IU] | PEN_INJECTOR | Freq: Every day | SUBCUTANEOUS | 0 refills | Status: AC
  Filled 2024-06-01: qty 15, 50d supply, fill #0

## 2024-06-01 NOTE — Telephone Encounter (Signed)
 Medication sent to pharmacy

## 2024-06-04 ENCOUNTER — Other Ambulatory Visit (HOSPITAL_COMMUNITY): Payer: Self-pay

## 2024-06-04 ENCOUNTER — Telehealth (HOSPITAL_COMMUNITY): Payer: Self-pay

## 2024-06-04 NOTE — Telephone Encounter (Signed)
 Pharmacy Patient Advocate Encounter  Received notification from [facility] MEDICAID that Prior Authorization for Dexcom G7 Sensor  has been APPROVED from 06/04/24 to 06/04/25. Ran test claim, Copay is $0. This test claim was processed through [facility]- copay amounts may vary at other pharmacies due to pharmacy/plan contracts, or as the patient moves through the different stages of their insurance plan.   PA #/Case ID/Reference #: [id number]

## 2024-06-04 NOTE — Telephone Encounter (Signed)
 Pharmacy Patient Advocate Encounter   Received notification from Patient Pharmacy that prior authorization for Dexcom G7 Sensor  is required/requested.   Insurance verification completed.   The patient is insured through [facility] MEDICAID.   Per test claim: PA required; PA submitted to above mentioned insurance via Latent Key/confirmation #/[id number] [facility] Status is pending

## 2024-06-05 ENCOUNTER — Other Ambulatory Visit: Payer: Self-pay

## 2024-06-06 NOTE — Telephone Encounter (Signed)
 Patient has not been seen over a year, pls advice him to make an appt so we can see how his diabetes is doing.

## 2024-06-29 ENCOUNTER — Encounter (HOSPITAL_COMMUNITY): Payer: Self-pay

## 2024-06-29 ENCOUNTER — Other Ambulatory Visit (HOSPITAL_COMMUNITY): Payer: Self-pay

## 2024-07-04 ENCOUNTER — Other Ambulatory Visit: Payer: Self-pay | Admitting: Cardiovascular Disease

## 2024-07-04 ENCOUNTER — Other Ambulatory Visit: Payer: Self-pay

## 2024-07-05 ENCOUNTER — Other Ambulatory Visit (HOSPITAL_COMMUNITY): Payer: Self-pay

## 2024-07-05 ENCOUNTER — Other Ambulatory Visit: Payer: Self-pay

## 2024-08-15 ENCOUNTER — Other Ambulatory Visit: Payer: Self-pay | Admitting: Student

## 2024-08-15 DIAGNOSIS — E1169 Type 2 diabetes mellitus with other specified complication: Secondary | ICD-10-CM

## 2024-08-16 ENCOUNTER — Other Ambulatory Visit: Payer: Self-pay

## 2024-08-16 ENCOUNTER — Other Ambulatory Visit: Payer: Self-pay | Admitting: Student

## 2024-08-16 DIAGNOSIS — E1169 Type 2 diabetes mellitus with other specified complication: Secondary | ICD-10-CM

## 2024-08-16 MED ORDER — OMNIPOD 5 DEXG7G6 PODS GEN 5 MISC
1.0000 | Freq: Every day | 0 refills | Status: AC
  Filled 2024-08-16: qty 5, 15d supply, fill #0
  Filled 2024-09-01: qty 5, 15d supply, fill #1

## 2024-08-17 ENCOUNTER — Other Ambulatory Visit: Payer: Self-pay

## 2024-08-21 ENCOUNTER — Other Ambulatory Visit (HOSPITAL_COMMUNITY): Payer: Self-pay

## 2024-09-01 ENCOUNTER — Other Ambulatory Visit: Payer: Self-pay | Admitting: Student

## 2024-09-01 DIAGNOSIS — F32A Depression, unspecified: Secondary | ICD-10-CM

## 2024-09-02 ENCOUNTER — Other Ambulatory Visit (HOSPITAL_COMMUNITY): Payer: Self-pay

## 2024-09-03 ENCOUNTER — Other Ambulatory Visit (HOSPITAL_COMMUNITY): Payer: Self-pay

## 2024-09-03 ENCOUNTER — Other Ambulatory Visit: Payer: Self-pay | Admitting: Student

## 2024-09-03 ENCOUNTER — Other Ambulatory Visit: Payer: Self-pay

## 2024-09-03 DIAGNOSIS — E1169 Type 2 diabetes mellitus with other specified complication: Secondary | ICD-10-CM

## 2024-09-04 ENCOUNTER — Other Ambulatory Visit: Payer: Self-pay

## 2024-09-07 ENCOUNTER — Other Ambulatory Visit: Payer: Self-pay

## 2024-09-17 ENCOUNTER — Ambulatory Visit: Payer: Self-pay
